# Patient Record
Sex: Male | Born: 1985 | Race: Black or African American | Hispanic: No | Marital: Single | Smoking: Current every day smoker
Health system: Southern US, Community
[De-identification: ages and names within clinical notes are randomized; demographics above are authoritative.]

## PROBLEM LIST (undated history)

## (undated) ENCOUNTER — Emergency Department (HOSPITAL_COMMUNITY): Discharge status: Absent without leave | Payer: Self-pay

## (undated) DIAGNOSIS — M1990 Unspecified osteoarthritis, unspecified site: Secondary | ICD-10-CM

## (undated) DIAGNOSIS — J45909 Unspecified asthma, uncomplicated: Secondary | ICD-10-CM

---
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
## 2007-01-31 ENCOUNTER — Emergency Department (HOSPITAL_COMMUNITY): Admission: EM | Admit: 2007-01-31 | Discharge: 2007-02-01 | Payer: Self-pay | Admitting: Emergency Medicine

## 2009-05-04 ENCOUNTER — Emergency Department (HOSPITAL_COMMUNITY): Admission: EM | Admit: 2009-05-04 | Discharge: 2009-05-04 | Payer: Self-pay | Admitting: Emergency Medicine

## 2009-11-23 ENCOUNTER — Emergency Department (HOSPITAL_COMMUNITY): Admission: EM | Admit: 2009-11-23 | Discharge: 2009-11-24 | Payer: Self-pay | Admitting: Emergency Medicine

## 2011-10-29 ENCOUNTER — Emergency Department (HOSPITAL_COMMUNITY)
Admission: EM | Admit: 2011-10-29 | Discharge: 2011-10-30 | Disposition: A | Discharge status: Home / Self Care | Payer: Self-pay | Attending: Emergency Medicine | Admitting: Emergency Medicine

## 2011-10-29 ENCOUNTER — Encounter (HOSPITAL_COMMUNITY): Payer: Self-pay | Admitting: Emergency Medicine

## 2011-10-29 DIAGNOSIS — F1010 Alcohol abuse, uncomplicated: Secondary | ICD-10-CM | POA: Insufficient documentation

## 2011-10-29 LAB — CBC
MCH: 31.5 pg (ref 26.0–34.0)
Platelets: 227 10*3/uL (ref 150–400)
RDW: 12.2 % (ref 11.5–15.5)

## 2011-10-29 LAB — RAPID URINE DRUG SCREEN, HOSP PERFORMED
Amphetamines: NOT DETECTED
Barbiturates: NOT DETECTED
Opiates: NOT DETECTED
Tetrahydrocannabinol: NOT DETECTED

## 2011-10-29 LAB — COMPREHENSIVE METABOLIC PANEL
AST: 38 U/L — ABNORMAL HIGH (ref 0–37)
Alkaline Phosphatase: 56 U/L (ref 39–117)
BUN: 7 mg/dL (ref 6–23)
Calcium: 8.7 mg/dL (ref 8.4–10.5)
GFR calc non Af Amer: >90 mL/min (ref >90)
Glucose, Bld: 95 mg/dL (ref 70–99)
Potassium: 3.6 mEq/L (ref 3.5–5.1)
Total Bilirubin: 0.3 mg/dL (ref 0.3–1.2)

## 2011-10-29 MED ORDER — THIAMINE HCL 100 MG/ML IJ SOLN
100.0000 mg | Freq: Every day | INTRAMUSCULAR | Status: DC
  Filled 2011-10-29: qty 2

## 2011-10-29 MED ORDER — SODIUM CHLORIDE 0.9 % IV BOLUS (SEPSIS)
1000.0000 mL | Freq: Once | INTRAVENOUS | Status: AC
  Administered 2011-10-29: 1000 mL via INTRAVENOUS

## 2011-10-29 MED ORDER — LORAZEPAM 2 MG/ML IJ SOLN: 1.0000 mg | Freq: Four times a day (QID) | INTRAMUSCULAR | Status: DC | PRN

## 2011-10-29 MED ORDER — ONDANSETRON HCL 8 MG PO TABS: 4.0000 mg | ORAL_TABLET | Freq: Three times a day (TID) | ORAL | Status: DC | PRN

## 2011-10-29 MED ORDER — ADULT MULTIVITAMIN W/MINERALS CH
1.0000 | ORAL_TABLET | Freq: Every day | ORAL | Status: DC
  Administered 2011-10-29: 1 via ORAL
  Filled 2011-10-29: qty 1

## 2011-10-29 MED ORDER — LORAZEPAM 1 MG PO TABS: 0.0000 mg | ORAL_TABLET | Freq: Two times a day (BID) | ORAL | Status: DC

## 2011-10-29 MED ORDER — VITAMIN B-1 100 MG PO TABS
100.0000 mg | ORAL_TABLET | Freq: Every day | ORAL | Status: DC
  Administered 2011-10-29: 100 mg via ORAL
  Filled 2011-10-29: qty 1

## 2011-10-29 MED ORDER — FOLIC ACID 1 MG PO TABS
1.0000 mg | ORAL_TABLET | Freq: Every day | ORAL | Status: DC
  Administered 2011-10-29: 1 mg via ORAL
  Filled 2011-10-29: qty 1

## 2011-10-29 MED ORDER — LORAZEPAM 1 MG PO TABS
0.0000 mg | ORAL_TABLET | Freq: Four times a day (QID) | ORAL | Status: DC
  Administered 2011-10-29: 1 mg via ORAL

## 2011-10-29 MED ORDER — IBUPROFEN 200 MG PO TABS: 600.0000 mg | ORAL_TABLET | Freq: Three times a day (TID) | ORAL | Status: DC | PRN

## 2011-10-29 MED ORDER — LORAZEPAM 1 MG PO TABS
1.0000 mg | ORAL_TABLET | Freq: Four times a day (QID) | ORAL | Status: DC | PRN
  Filled 2011-10-29: qty 1
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
## 2011-10-29 NOTE — ED Notes (Signed)
Pt here for detox from ETOH; pt sts drinks 3 40oz beers a day; pt sts last drink was today; pt tearful at present and denies SI/HI

## 2011-10-29 NOTE — ED Provider Notes (Signed)
History     CSN: [id number]  Arrival date & time 10/29/11  1457   First MD Initiated Contact with Patient 10/29/11 1657      Chief Complaint  Patient presents with  . Medical Clearance    (Consider location/radiation/quality/duration/timing/severity/associated sxs/prior treatment) HPI Comments: Patient presents with alcohol abuse and intoxication requesting detoxification. Is dropped off by his neighbor because he was "drunk". States he drinks beer every day. He does want to quit. His last drink was just before arrival. He denies illicit drug use. He denies any suicidal or homicidal ideation. Denies any trauma or pain complaints.  The history is provided by the patient.    History reviewed. No pertinent past medical history.  History reviewed. No pertinent past surgical history.  History reviewed. No pertinent family history.  History  Substance Use Topics  . Smoking status: Current Everyday Smoker  . Smokeless tobacco: Not on file  . Alcohol Use: Yes      Review of Systems  Constitutional: Negative for fever and fatigue.  HENT: Negative for congestion and rhinorrhea.   Respiratory: Negative for cough and shortness of breath.   Cardiovascular: Negative for chest pain.  Gastrointestinal: Negative for abdominal pain.  Genitourinary: Negative for dysuria.  Musculoskeletal: Negative for back pain.  Neurological: Negative for headaches.    Allergies  Review of patient's allergies indicates no known allergies.  Home Medications  No current outpatient prescriptions on file.  BP 131/86  Pulse 86  Temp(Src) 98 F (36.7 C) (Oral)  Resp 16  SpO2 99%  Physical Exam  Constitutional: He is oriented to person, place, and time. He appears well-developed and well-nourished. No distress.  HENT:  Head: Atraumatic.  Eyes: Right conjunctiva is injected. Left conjunctiva is injected.  Neck: Normal range of motion. Neck supple.  Cardiovascular: Normal rate, regular rhythm and  normal heart sounds.   Pulmonary/Chest: Effort normal and breath sounds normal. No respiratory distress.  Abdominal: Soft. There is no tenderness. There is no rebound and no guarding.  Musculoskeletal: Normal range of motion. He exhibits no edema and no tenderness.  Neurological: He is alert and oriented to person, place, and time. No cranial nerve deficit.  Skin: Skin is warm.    ED Course  Procedures (including critical care time)  Labs Reviewed  COMPREHENSIVE METABOLIC PANEL - Abnormal; Notable for the following:    Albumin 3.1 (*)    AST 38 (*)    All other components within normal limits  ETHANOL - Abnormal; Notable for the following:    Alcohol, Ethyl (B) 260 (*)    All other components within normal limits  CBC  URINE RAPID DRUG SCREEN (HOSP PERFORMED)   No results found.   1. Alcohol abuse       MDM  Alcohol abuse with intoxication. No evidence of trauma. No suicidal or homicidal thoughts.  Alcohol withdrawal protocol. Screening labs, discuss with act team  HR improved.  No signs of active withdrawal.  D/w Tammy Sours of ACT team.  Patient has resources through parole program and does not meet inpatient criteria.  NO SI or HI. HR 94 at discharge, no tremors.    Glynn Octave, MD 10/30/11 1315

## 2011-10-30 NOTE — ED Notes (Signed)
Pt states he arrived to facility via bus - pt states he is not able to obtain a ride home at this time - d/t pt's ETOH level being 260 @ 1600 pt to wait in facility until pt more appropriate for discharge. Cletis Athens, Consulting civil engineer made aware.

## 2011-10-30 NOTE — Discharge Instructions (Signed)
Alcohol Problems Followup with the alcohol abuse resources given. Return to the ED if you to their worsening symptoms Most adults who drink alcohol drink in moderation (not a lot) are at low risk for developing problems related to their drinking. However, all drinkers, including low-risk drinkers, should know about the health risks connected with drinking alcohol. RECOMMENDATIONS FOR LOW-RISK DRINKING  Drink in moderation. Moderate drinking is defined as follows:   Men - no more than 2 drinks per day.   Nonpregnant women - no more than 1 drink per day.   Over age 62 - no more than 1 drink per day.  A standard drink is 12 grams of pure alcohol, which is equal to a 12 ounce bottle of beer or wine cooler, a 5 ounce glass of wine, or 1.5 ounces of distilled spirits (such as whiskey, brandy, vodka, or rum).  ABSTAIN FROM (DO NOT DRINK) ALCOHOL:  When pregnant or considering pregnancy.   When taking a medication that interacts with alcohol.   If you are alcohol dependent.   A medical condition that prohibits drinking alcohol (such as ulcer, liver disease, or heart disease).  DISCUSS WITH YOUR CAREGIVER:  If you are at risk for coronary heart disease, discuss the potential benefits and risks of alcohol use: Light to moderate drinking is associated with lower rates of coronary heart disease in certain populations (for example, men over age 64 and postmenopausal women). Infrequent or nondrinkers are advised not to begin light to moderate drinking to reduce the risk of coronary heart disease so as to avoid creating an alcohol-related problem. Similar protective effects can likely be gained through proper diet and exercise.   Women and the elderly have smaller amounts of body water than men. As a result women and the elderly achieve a higher blood alcohol concentration after drinking the same amount of alcohol.   Exposing a fetus to alcohol can cause a broad range of birth defects referred to as  Fetal Alcohol Syndrome (FAS) or Alcohol-Related Birth Defects (ARBD). Although FAS/ARBD is connected with excessive alcohol consumption during pregnancy, studies also have reported neurobehavioral problems in infants born to mothers reporting drinking an average of 1 drink per day during pregnancy.   Heavier drinking (the consumption of more than 4 drinks per occasion by men and more than 3 drinks per occasion by women) impairs learning (cognitive) and psychomotor functions and increases the risk of alcohol-related problems, including accidents and injuries.  CAGE QUESTIONS:   Have you ever felt that you should Cut down on your drinking?   Have people Annoyed you by criticizing your drinking?   Have you ever felt bad or Guilty about your drinking?   Have you ever had a drink first thing in the morning to steady your nerves or get rid of a hangover (Eye opener)?  If you answered positively to any of these questions: You may be at risk for alcohol-related problems if alcohol consumption is:   Men: Greater than 14 drinks per week or more than 4 drinks per occasion.   Women: Greater than 7 drinks per week or more than 3 drinks per occasion.  Do you or your family have a medical history of alcohol-related problems, such as:  Blackouts.   Sexual dysfunction.   Depression.   Trauma.   Liver dysfunction.   Sleep disorders.   Hypertension.   Chronic abdominal pain.   Has your drinking ever caused you problems, such as problems with your family, problems with your work (  or school) performance, or accidents/injuries?   Do you have a compulsion to drink or a preoccupation with drinking?   Do you have poor control or are you unable to stop drinking once you have started?   Do you have to drink to avoid withdrawal symptoms?   Do you have problems with withdrawal such as tremors, nausea, sweats, or mood disturbances?   Does it take more alcohol than in the past to get you high?   Do  you feel a strong urge to drink?   Do you change your plans so that you can have a drink?   Do you ever drink in the morning to relieve the shakes or a hangover?  If you have answered a number of the previous questions positively, it may be time for you to talk to your caregivers, family, and friends and see if they think you have a problem. Alcoholism is a chemical dependency that keeps getting worse and will eventually destroy your health and relationships. Many alcoholics end up dead, impoverished, or in prison. This is often the end result of all chemical dependency.  Do not be discouraged if you are not ready to take action immediately.   Decisions to change behavior often involve up and down desires to change and feeling like you cannot decide.   Try to think more seriously about your drinking behavior.   Think of the reasons to quit.  WHERE TO GO FOR ADDITIONAL INFORMATION   The National Institute on Alcohol Abuse and Alcoholism (NIAAA)www.niaaa.nih.gov   [employer] on Alcoholism and Drug Dependence (NCADD)www.ncadd.org   American Society of Addiction Medicine (ASAM)www.https://anderson-johnson.com/  Document Released: 05/12/2005 Document Revised: 05/01/2011 Document Reviewed: 12/29/2007 [facility] Patient Information 2012 [place], [place].

## 2011-10-30 NOTE — ED Notes (Signed)
D/c instructions reviewed w/ pt - pt denies any further questions or concerns at present. Pt ambulating independently w/ steady gait on d/c in no acute distress, A&Ox4.  

## 2012-11-05 ENCOUNTER — Other Ambulatory Visit: Payer: Self-pay | Admitting: Occupational Medicine

## 2012-11-05 ENCOUNTER — Ambulatory Visit: Payer: Self-pay

## 2012-11-05 DIAGNOSIS — R52 Pain, unspecified: Secondary | ICD-10-CM

## 2013-11-24 ENCOUNTER — Encounter (HOSPITAL_COMMUNITY): Payer: Self-pay | Admitting: Emergency Medicine

## 2013-11-24 ENCOUNTER — Emergency Department (HOSPITAL_COMMUNITY)
Admission: EM | Admit: 2013-11-24 | Discharge: 2013-11-24 | Disposition: A | Discharge status: Home / Self Care | Payer: Self-pay | Attending: Emergency Medicine | Admitting: Emergency Medicine

## 2013-11-24 DIAGNOSIS — M129 Arthropathy, unspecified: Secondary | ICD-10-CM | POA: Insufficient documentation

## 2013-11-24 DIAGNOSIS — Z113 Encounter for screening for infections with a predominantly sexual mode of transmission: Secondary | ICD-10-CM | POA: Insufficient documentation

## 2013-11-24 DIAGNOSIS — Z202 Contact with and (suspected) exposure to infections with a predominantly sexual mode of transmission: Secondary | ICD-10-CM | POA: Insufficient documentation

## 2013-11-24 HISTORY — DX: Unspecified osteoarthritis, unspecified site: M19.90

## 2013-11-24 NOTE — Discharge Instructions (Signed)
Be sure to practice safe sex. Monitor symptom, if develop new lesions please follow-up with the health dept for re-check.

## 2013-11-24 NOTE — ED Provider Notes (Signed)
Medical screening examination/treatment/procedure(s) were performed by non-physician practitioner and as supervising physician I was immediately available for consultation/collaboration.   EKG Interpretation None        Jhoana Upham, MD 11/24/13 2319 

## 2013-11-24 NOTE — ED Notes (Signed)
Pt states- bumps on penis since March, denies itching pain or discharge. Wants to be checked for STDS. One of patients partners tested positive for herpes simplex 1.

## 2013-11-24 NOTE — ED Provider Notes (Signed)
CSN: [id number]     Arrival date & time 11/24/13  1724 History  This chart was scribed for non-physician practitioner, Garlon HatchetLisa M Tynslee Bowlds, PA-C, working with Rolan BuccoMelanie Belfi, MD, by Bronson CurbJacqueline Melvin, ED Scribe. This patient was seen in room TR07C/TR07C and the patient's care was started at 6:02 PM.    Chief Complaint  Patient presents with  . SEXUALLY TRANSMITTED DISEASE    The history is provided by the patient. No language interpreter was used.    HPI Comments: Howard Adams is a 28 y.o. male who presents to the Emergency Department complaining of bumps on penis since March. Patient reports he has been having unprotected sexual intercourse with multiple partners and one of his partners tested positive for Herpes Simplex 1 last week. States bumps are raised but non-painful. Patient is concerned of possible herpes and just wanted to get "bumps" checked. Pateint denies fever, itching, penile discharge, penile pain, dysuria, hematuria, abdominal pain, or testicular pain.  He has no prior hx of HSV1 or HSV2.  He has no current oral lesions.   Past Medical History  Diagnosis Date  . Arthritis    History reviewed. No pertinent past surgical history. History reviewed. No pertinent family history. History  Substance Use Topics  . Smoking status: Current Every Day Smoker  . Smokeless tobacco: Not on file  . Alcohol Use: Yes    Review of Systems  Genitourinary: Negative for dysuria, penile pain and testicular pain.       "bumps" on penis x4 months  All other systems reviewed and are negative.     Allergies  Review of patient's allergies indicates no known allergies.  Home Medications   Prior to Admission medications   Not on File   Triage Vitals: BP 119/74  Pulse 80  Temp(Src) 98.3 F (36.8 C) (Oral)  Resp 16  SpO2 97%  Physical Exam  Nursing note and vitals reviewed. Constitutional: He is oriented to person, place, and time. He appears well-developed and well-nourished.  HENT:   Head: Normocephalic and atraumatic.  Mouth/Throat: Oropharynx is clear and moist.  No oral lesions noted  Eyes: Conjunctivae and EOM are normal. Pupils are equal, round, and reactive to light.  Neck: Normal range of motion. Neck supple.  Cardiovascular: Normal rate, regular rhythm and normal heart sounds.   Pulmonary/Chest: Effort normal and breath sounds normal.  Genitourinary: Testes normal and penis normal. Circumcised. No penile erythema or penile tenderness. No discharge found.  Penis with small, wart like lesions present, mostly along left side of penis; lesions non-tender; no vesicles, ulcerations, or pustules present; no urethral discharge or erythema; testicles non-tender  Musculoskeletal: Normal range of motion.  Neurological: He is alert and oriented to person, place, and time.  Skin: Skin is warm and dry.  Psychiatric: He has a normal mood and affect.    ED Course  Procedures (including critical care time)  DIAGNOSTIC STUDIES: Oxygen Saturation is 97% on room air, adequate by my interpretation.    COORDINATION OF CARE: 6:14 PM- Pt advised of plan for treatment and pt agrees.   Labs Review Labs Reviewed - No data to display  Imaging Review No results found.   EKG Interpretation None      MDM   Final diagnoses:  Possible exposure to STD   On exam, pt has small wart-like lesions mostly concentrated along left side of his penis-- questionable HPV.  Lesions are non-tender, no vesicles, or ulcerations present.  Pt likely with genital warts, lesions are not  concerning for HSV.  He has no other associated sx or concern for other STD at this time.  I have discussed with him several times that just because he does not have a current herpetic outbreak, does not mean that it will not occur.  He was advised several times on safe sex practices and monitoring symptoms closely. He was encouraged to follow with the health department if problems occur.   Discussed plan with  patient, he/she acknowledged understanding and agreed with plan of care.  Return precautions given for new or worsening symptoms.  I personally performed the services described in this documentation, which was scribed in my presence. The recorded information has been reviewed and is accurate.  Garlon HatchetLisa M Earnie Rockhold, PA-C 11/24/13 1842

## 2015-07-28 ENCOUNTER — Emergency Department (HOSPITAL_COMMUNITY)
Admission: EM | Admit: 2015-07-28 | Discharge: 2015-07-28 | Disposition: A | Discharge status: Home / Self Care | Payer: Self-pay | Attending: Emergency Medicine | Admitting: Emergency Medicine

## 2015-07-28 ENCOUNTER — Emergency Department (HOSPITAL_COMMUNITY): Payer: Self-pay

## 2015-07-28 ENCOUNTER — Encounter (HOSPITAL_COMMUNITY): Payer: Self-pay | Admitting: Emergency Medicine

## 2015-07-28 DIAGNOSIS — Y9289 Other specified places as the place of occurrence of the external cause: Secondary | ICD-10-CM | POA: Insufficient documentation

## 2015-07-28 DIAGNOSIS — Y998 Other external cause status: Secondary | ICD-10-CM | POA: Insufficient documentation

## 2015-07-28 DIAGNOSIS — W500XXA Accidental hit or strike by another person, initial encounter: Secondary | ICD-10-CM | POA: Insufficient documentation

## 2015-07-28 DIAGNOSIS — F17200 Nicotine dependence, unspecified, uncomplicated: Secondary | ICD-10-CM | POA: Insufficient documentation

## 2015-07-28 DIAGNOSIS — Y9389 Activity, other specified: Secondary | ICD-10-CM | POA: Insufficient documentation

## 2015-07-28 DIAGNOSIS — S29001A Unspecified injury of muscle and tendon of front wall of thorax, initial encounter: Secondary | ICD-10-CM | POA: Insufficient documentation

## 2015-07-28 DIAGNOSIS — R0781 Pleurodynia: Secondary | ICD-10-CM

## 2015-07-28 MED ORDER — METHOCARBAMOL 500 MG PO TABS
1000.0000 mg | ORAL_TABLET | Freq: Once | ORAL | Status: AC
Start: 2015-07-28 — End: 2015-07-28
  Administered 2015-07-28: 1000 mg via ORAL
  Filled 2015-07-28: qty 2

## 2015-07-28 MED ORDER — IBUPROFEN 800 MG PO TABS
800.0000 mg | ORAL_TABLET | Freq: Once | ORAL | Status: AC
  Administered 2015-07-28: 800 mg via ORAL
  Filled 2015-07-28: qty 1

## 2015-07-28 MED ORDER — IBUPROFEN 800 MG PO TABS: 800.0000 mg | ORAL_TABLET | Freq: Three times a day (TID) | ORAL | Status: DC

## 2015-07-28 MED ORDER — METHOCARBAMOL 500 MG PO TABS: 1000.0000 mg | ORAL_TABLET | Freq: Two times a day (BID) | ORAL | Status: DC

## 2015-07-28 NOTE — Discharge Instructions (Signed)
Medications given today: Ibuprofen, Robaxin  Treatment: Take Ibuprofen and Robaxin (muscle relaxer) as prescribed. Alternate ice and heat, 20 minutes each. Avoid any heavy lifting. Attempt to quit smoking to accelerate healing and re-injury.  Follow-up: Make an appointment with your Primary Care Provider in 1 week for reevaluation. Return to the Emergency Department if you experience any sudden chest pain, shortness of breath, numbness, or any other concerning symptoms.

## 2015-07-28 NOTE — ED Notes (Signed)
ED PA at bedside
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
## 2015-07-28 NOTE — ED Notes (Addendum)
PT made aware he cannot drive or operate machinery for the rest of the day or while continuing to take Robaxin
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
## 2017-02-04 ENCOUNTER — Emergency Department (HOSPITAL_COMMUNITY): Payer: Self-pay

## 2017-02-04 ENCOUNTER — Encounter (HOSPITAL_COMMUNITY): Payer: Self-pay | Admitting: Emergency Medicine

## 2017-02-04 ENCOUNTER — Emergency Department (HOSPITAL_COMMUNITY)
Admission: EM | Admit: 2017-02-04 | Discharge: 2017-02-04 | Disposition: A | Discharge status: Home / Self Care | Payer: Self-pay | Attending: Emergency Medicine | Admitting: Emergency Medicine

## 2017-02-04 DIAGNOSIS — R0789 Other chest pain: Secondary | ICD-10-CM | POA: Insufficient documentation

## 2017-02-04 DIAGNOSIS — F17200 Nicotine dependence, unspecified, uncomplicated: Secondary | ICD-10-CM | POA: Insufficient documentation

## 2017-02-04 DIAGNOSIS — Z79899 Other long term (current) drug therapy: Secondary | ICD-10-CM | POA: Insufficient documentation

## 2017-02-04 DIAGNOSIS — R0781 Pleurodynia: Secondary | ICD-10-CM

## 2017-02-04 DIAGNOSIS — M79672 Pain in left foot: Secondary | ICD-10-CM | POA: Insufficient documentation

## 2017-02-04 MED ORDER — IBUPROFEN 600 MG PO TABS: 600.0000 mg | ORAL_TABLET | Freq: Four times a day (QID) | ORAL | 0 refills | Status: DC | PRN

## 2017-02-04 MED ORDER — ACETAMINOPHEN 325 MG PO TABS: 650.0000 mg | ORAL_TABLET | Freq: Four times a day (QID) | ORAL | 0 refills | Status: DC | PRN

## 2017-02-04 MED ORDER — IBUPROFEN 800 MG PO TABS
800.0000 mg | ORAL_TABLET | Freq: Once | ORAL | Status: AC
  Administered 2017-02-04: 800 mg via ORAL
  Filled 2017-02-04: qty 1

## 2017-02-04 NOTE — Discharge Instructions (Addendum)
Please see the information and instructions below regarding your visit.  Your diagnoses today include:  1. Foot pain, left   2. Rib pain    Your imaging is reassuring that there is no fracture in your further ankle. Additionally, there is no fracture in your ribs. You may have some contusions which are deep bruising and injury to the tissues but not the bone.  Tests performed today include: See side panel of your discharge paperwork for testing performed today. Vital signs are listed at the bottom of these instructions.   1. X-ray left foot 2. X-ray left ankle 3. X-ray ribs  Medications prescribed:    You are prescribed ibuprofen, a non-steroidal anti-inflammatory agent (NSAID) for pain. You may take 600mg  every 6 hours as needed for pain. If still requiring this medication around the clock for acute pain after 10 days, please see your primary healthcare provider.  You may combine this medication with Tylenol, 650 mg every 6 hours, so you are receiving something for pain every 3 hours.  This is not a long-term medication unless under the care and direction of your primary provider. Taking this medication long-term and not under the supervision of a healthcare provider could increase the risk of stomach ulcers, kidney problems, and cardiovascular problems such as high blood pressure.   Take any prescribed medications only as prescribed, and any over the counter medications only as directed on the packaging.  Home care instructions:   Please rest, ice, elevate, and compress the left foot with an Ace wrap. Do not leave ice on skin for more than 20 minutes.   Please follow any educational materials contained in this packet.   Follow-up instructions: Please follow up with Dr. Roda ShuttersXu with [facility] as soon as possible.  Return instructions:  Please return to the Emergency Department if you experience worsening symptoms.  Please return for any worsening pain in your left foot,  swelling, loss of sensation, discoloration were your foot loses color, coldness of your left foot. Please return if you have any other emergent concerns.  Additional Information: Please avoid using Molly as a method of pain control. This drug can affect the heart, and put you at risk in the future for heart problems. Please take the ibuprofen prescribed to today for pain control.  Your vital signs today were: BP (!) 139/106 (BP Location: Left Arm)    Pulse (!) 107    Temp 98.2 F (36.8 C) (Oral)    Resp 18    SpO2 98%  If your blood pressure (BP) was elevated on multiple readings during this visit above 130 for the top number or above 80 for the bottom number, please have this repeated by your primary care provider within one month. --------------  Thank you for allowing us to participate in your care today.

## 2017-02-04 NOTE — ED Triage Notes (Signed)
Pt reports left heel pain that began today after he landed wrong after doing a back flip. Pt reports he cannot put weight on his left heel.

## 2017-02-04 NOTE — ED Notes (Signed)
Pt in xray
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
## 2017-02-09 ENCOUNTER — Emergency Department (HOSPITAL_COMMUNITY): Admission: EM | Admit: 2017-02-09 | Discharge: 2017-02-09 | Discharge status: Absent without leave | Payer: Self-pay

## 2017-02-10 ENCOUNTER — Telehealth: Payer: Self-pay | Admitting: Emergency Medicine

## 2017-02-10 NOTE — Telephone Encounter (Signed)
Pt called stating his foot did no feel any better and that he will need an additional work note.  Advised him to follow up with PCP or specialist as discussed during D/C from ED visit.  Pt reported he called but was unable to get through to Dr. Warren Danes office.  Advised him he had the correct number and to continue to try.  No further CM needs noted at this time.

## 2017-02-11 ENCOUNTER — Emergency Department (HOSPITAL_COMMUNITY)
Admission: EM | Admit: 2017-02-11 | Discharge: 2017-02-11 | Disposition: A | Discharge status: Home / Self Care | Payer: Self-pay | Attending: Emergency Medicine | Admitting: Emergency Medicine

## 2017-02-11 ENCOUNTER — Encounter (HOSPITAL_COMMUNITY): Payer: Self-pay | Admitting: *Deleted

## 2017-02-11 DIAGNOSIS — F17200 Nicotine dependence, unspecified, uncomplicated: Secondary | ICD-10-CM | POA: Insufficient documentation

## 2017-02-11 DIAGNOSIS — Y9389 Activity, other specified: Secondary | ICD-10-CM | POA: Insufficient documentation

## 2017-02-11 DIAGNOSIS — S99822A Other specified injuries of left foot, initial encounter: Secondary | ICD-10-CM | POA: Insufficient documentation

## 2017-02-11 DIAGNOSIS — Y929 Unspecified place or not applicable: Secondary | ICD-10-CM | POA: Insufficient documentation

## 2017-02-11 DIAGNOSIS — X58XXXA Exposure to other specified factors, initial encounter: Secondary | ICD-10-CM | POA: Insufficient documentation

## 2017-02-11 DIAGNOSIS — Y999 Unspecified external cause status: Secondary | ICD-10-CM | POA: Insufficient documentation

## 2017-02-11 DIAGNOSIS — S99922A Unspecified injury of left foot, initial encounter: Secondary | ICD-10-CM

## 2017-02-11 NOTE — ED Provider Notes (Signed)
[facility] Provider Note   CSN: [id number] Arrival date & time: 02/11/17  0645     History   Chief Complaint Chief Complaint  Patient presents with  . Foot Pain    HPI Howard Adams is a 31 y.o. male.  HPI   Howard Adams returns to the ER for re-evaluation of left heel pain. He was doing a back flip and landed on his heel. He was seen on 9/12 and diagnosed with a contusion to his feel. He received crutches and a post-op boot. He comes back because the ortho office wants $100 upfront and he doesn't have that for follow-up. He works in [employer] and needs to be able to stand for 10 hours but his heel still is painful to put pressure on. Swelling is not worse, no redness, no numbness. No new areas of pain.   History reviewed. No pertinent past medical history.  There are no active problems to display for this patient.   History reviewed. No pertinent surgical history.     Home Medications    Prior to Admission medications   Medication Sig Start Date End Date Taking? Authorizing Provider  acetaminophen (TYLENOL) 325 MG tablet Take 2 tablets (650 mg total) by mouth every 6 (six) hours as needed. 02/04/17   Aviva Kluver B, PA-C  ibuprofen (ADVIL,MOTRIN) 600 MG tablet Take 1 tablet (600 mg total) by mouth every 6 (six) hours as needed. 02/04/17   Aviva Kluver B, PA-C  methocarbamol (ROBAXIN) 500 MG tablet Take 2 tablets (1,000 mg total) by mouth 2 (two) times daily. 07/28/15   Emi Holes, PA-C    Family History History reviewed. No pertinent family history.  Social History Social History  Substance Use Topics  . Smoking status: Current Every Day Smoker    Packs/day: 1.00    Years: 10.00  . Smokeless tobacco: Not on file  . Alcohol use Yes     Allergies   Patient has no known allergies.   Review of Systems Review of Systems The patient denies anorexia, fever, weight loss,, vision loss, decreased hearing, hoarseness, chest pain, syncope, dyspnea on  exertion, peripheral edema, balance deficits, hemoptysis, abdominal pain, melena, hematochezia, severe indigestion/heartburn, hematuria, incontinence, genital sores, muscle weakness, suspicious skin lesions, transient blindness,, depression, unusual weight change, abnormal bleeding, enlarged lymph nodes, angioedema, and breast masses.    Physical Exam Updated Vital Signs BP 130/87 (BP Location: Right Arm)   Pulse 92   Temp 97.9 F (36.6 C) (Oral)   Resp 18   SpO2 98%   Physical Exam  Constitutional: He appears well-developed and well-nourished. No distress.  HENT:  Head: Normocephalic and atraumatic.  Eyes: Pupils are equal, round, and reactive to light.  Neck: Normal range of motion. Neck supple.  Cardiovascular: Normal rate and regular rhythm.   Pulmonary/Chest: Effort normal.  Abdominal: Soft.  Musculoskeletal:       Left foot: There is tenderness, bony tenderness and swelling (mild). There is normal range of motion, normal capillary refill, no crepitus, no deformity and no laceration.       Feet:  Achilles grossly intact and non tender.  Neurological: He is alert.  Skin: Skin is warm and dry.  Nursing note and vitals reviewed.    ED Treatments / Results  Labs (all labs ordered are listed, but only abnormal results are displayed) Labs Reviewed - No data to display  EKG  EKG Interpretation None       Radiology No results found.  Procedures Procedures (  including critical care time)  Medications Ordered in ED Medications - No data to display   Initial Impression / Assessment and Plan / ED Course  I have reviewed the triage vital signs and the nursing notes.  Pertinent labs & imaging results that were available during my care of the patient were reviewed by me and considered in my medical decision making (see chart for details).     No infection is present or emergent s/sx.Marland Kitchen He is tender to his calcaneous. He was offered a repeat xray and declined. Will  try referring back to Ortho. Original referral was for [place] but today it is Dr. Greig Right office. Continue Ibuprofen and non weight bearing. Will give work note  Blood pressure 130/87, pulse 92, temperature 97.9 F (36.6 C), temperature source Oral, resp. rate 18, SpO2 98 %.  Howard Adams has been evaluated today in the emergency department. The appropriate screening and testing was been performed and I believe the patient to be medically stable for discharge.   Return signs and symptoms have been discussed with the patient and/or caregivers and they have voiced their understanding. The patient has agreed to follow-up with their primary care provider or the referred specialist.      Final Clinical Impressions(s) / ED Diagnoses   Final diagnoses:  Injury of heel, left, initial encounter    New Prescriptions New Prescriptions   No medications on file     Marlon Pel, PA-C 02/11/17 0949    Mancel Bale, MD 02/11/17 [phone]

## 2017-02-11 NOTE — ED Triage Notes (Signed)
Pt was here on 9/12 and was diagnosed with contusion to left foot and reports still having foot pain.

## 2017-03-16 ENCOUNTER — Emergency Department (HOSPITAL_COMMUNITY): Payer: Self-pay

## 2017-03-16 ENCOUNTER — Emergency Department (HOSPITAL_COMMUNITY)
Admission: EM | Admit: 2017-03-16 | Discharge: 2017-03-16 | Disposition: A | Discharge status: Home / Self Care | Payer: Self-pay | Attending: Emergency Medicine | Admitting: Emergency Medicine

## 2017-03-16 ENCOUNTER — Encounter (HOSPITAL_COMMUNITY): Payer: Self-pay

## 2017-03-16 DIAGNOSIS — B349 Viral infection, unspecified: Secondary | ICD-10-CM | POA: Insufficient documentation

## 2017-03-16 DIAGNOSIS — J069 Acute upper respiratory infection, unspecified: Secondary | ICD-10-CM | POA: Insufficient documentation

## 2017-03-16 DIAGNOSIS — B9789 Other viral agents as the cause of diseases classified elsewhere: Secondary | ICD-10-CM

## 2017-03-16 DIAGNOSIS — F17200 Nicotine dependence, unspecified, uncomplicated: Secondary | ICD-10-CM | POA: Insufficient documentation

## 2017-03-16 DIAGNOSIS — J4521 Mild intermittent asthma with (acute) exacerbation: Secondary | ICD-10-CM | POA: Insufficient documentation

## 2017-03-16 MED ORDER — ALBUTEROL SULFATE HFA 108 (90 BASE) MCG/ACT IN AERS: 1.0000 | INHALATION_SPRAY | Freq: Four times a day (QID) | RESPIRATORY_TRACT | 0 refills | Status: DC | PRN

## 2017-03-16 MED ORDER — IPRATROPIUM-ALBUTEROL 0.5-2.5 (3) MG/3ML IN SOLN
3.0000 mL | Freq: Once | RESPIRATORY_TRACT | Status: AC
  Administered 2017-03-16: 3 mL via RESPIRATORY_TRACT
  Filled 2017-03-16: qty 3

## 2017-03-16 MED ORDER — PREDNISONE 10 MG PO TABS: 40.0000 mg | ORAL_TABLET | Freq: Every day | ORAL | 0 refills | Status: AC

## 2017-03-16 NOTE — ED Notes (Signed)
Pt ambulated with O2 saturator monitoring. Pt at rest was 94% RA and with Ambulation decreased to 92-93 %, with increased ambulation pt O2 levels improved up  to 98%.

## 2017-03-16 NOTE — Discharge Instructions (Signed)
Your x-ray did not show pneumonia.   I suspect you initially had a viral upper respiratory infection that caused an asthma flare. Your cigarette use is also not helping. You should stop smoking  Take prednisone as prescribed.  Use albuterol inhaler every 6 hours.  Take a daily allergy medication (claritin, zyrtec, etc).  Take mucinex or any other expectorant during the day to help you clear the mucus in your airways.  At night time, you can instead take a cough suppressant to give your body a break from repeat coughing and so you can sleep.  You are at risk of developing pneumonia. Return to ED if you develop fevers, chills, chest pain, worsening cough, body aches or develop any other concerning symptoms.

## 2017-03-16 NOTE — ED Triage Notes (Signed)
patient c/o a productive cough with green sputum x 2 1/2 weeks. Patient reports SOB with exertion.

## 2017-03-16 NOTE — ED Provider Notes (Signed)
[facility]-EMERGENCY DEPT Provider Note   CSN: [id number] Arrival date & time: 03/16/17  1540     History   Chief Complaint Chief Complaint  Patient presents with  . Cough    HPI Howard Adams is a 31 y.o. male with history of childhood asthma and tobacco use presents to the ED for productive cough with green sputum 3 weeks. Reports feeling more winded with normal ambulation. Spouse has been recently treated for pneumonia. Child at home has a cough as well. He denies fevers, chills, chest pain, myalgias, bodyaches, sore throat, rhinorrhea. Has used his wife's home albuterol nebulizing machine with some improvement. He smokes about one half pack per day.  HPI  History reviewed. No pertinent past medical history.  There are no active problems to display for this patient.   History reviewed. No pertinent surgical history.     Home Medications    Prior to Admission medications   Medication Sig Start Date End Date Taking? Authorizing Provider  acetaminophen (TYLENOL) 325 MG tablet Take 2 tablets (650 mg total) by mouth every 6 (six) hours as needed. 02/04/17   Aviva KluverMurray, Alyssa B, PA-C  albuterol (PROVENTIL HFA;VENTOLIN HFA) 108 (90 Base) MCG/ACT inhaler Inhale 1-2 puffs into the lungs every 6 (six) hours as needed for wheezing or shortness of breath. 03/16/17   Liberty HandyGibbons, Ky Rumple J, PA-C  ibuprofen (ADVIL,MOTRIN) 600 MG tablet Take 1 tablet (600 mg total) by mouth every 6 (six) hours as needed. 02/04/17   Aviva KluverMurray, Alyssa B, PA-C  methocarbamol (ROBAXIN) 500 MG tablet Take 2 tablets (1,000 mg total) by mouth 2 (two) times daily. 07/28/15   Law, Waylan BogaAlexandra M, PA-C  predniSONE (DELTASONE) 10 MG tablet Take 4 tablets (40 mg total) by mouth daily. 03/16/17 03/21/17  Liberty HandyGibbons, Ashleynicole Mcclees J, PA-C    Family History History reviewed. No pertinent family history.  Social History Social History  Substance Use Topics  . Smoking status: Current Every Day Smoker    Packs/day:  1.00    Years: 10.00  . Smokeless tobacco: Never Used  . Alcohol use Yes     Allergies   Patient has no known allergies.   Review of Systems Review of Systems  Constitutional: Negative for chills and fever.  HENT: Positive for congestion. Negative for postnasal drip, rhinorrhea, sneezing and sore throat.   Respiratory: Positive for cough, chest tightness, shortness of breath and wheezing. Negative for choking.   Cardiovascular: Negative for chest pain.  Gastrointestinal: Negative for abdominal pain.  Musculoskeletal: Negative for back pain.     Physical Exam Updated Vital Signs BP 130/90 (BP Location: Left Arm)   Pulse 100   Temp 98.3 F (36.8 C) (Oral)   Resp 18   Ht 5' 6.5" (1.689 m)   Wt 67.1 kg (148 lb)   SpO2 97%   BMI 23.53 kg/m   Physical Exam  Constitutional: He is oriented to person, place, and time. He appears well-developed and well-nourished. No distress.  NAD.  HENT:  Head: Normocephalic and atraumatic.  Right Ear: External ear normal.  Left Ear: External ear normal.  Nose: Nose normal.  Eyes: Conjunctivae and EOM are normal. No scleral icterus.  Neck: Normal range of motion. Neck supple.  Cardiovascular: Normal rate, regular rhythm, normal heart sounds and intact distal pulses.   No murmur heard. Pulmonary/Chest: Effort normal. He has wheezes.  RR within normal limits. SpO2 within normal limits.  +Diffuse inspiratory and expiratory wheezing in all lung fields most significant at upper and  middle lobes bilaterally (posteriorly) No egophony.  Normal breathing effort. Patient speaking in full sentences. No pursed lip breathing. No cyanosis. Chest wall expansion symmetric.  No chest wall tenderness.  Musculoskeletal: Normal range of motion. He exhibits no deformity.  Neurological: He is alert and oriented to person, place, and time.  Skin: Skin is warm and dry. Capillary refill takes less than 2 seconds.  Psychiatric: He has a normal mood and affect.  His behavior is normal. Judgment and thought content normal.  Nursing note and vitals reviewed.    ED Treatments / Results  Labs (all labs ordered are listed, but only abnormal results are displayed) Labs Reviewed - No data to display  EKG  EKG Interpretation None       Radiology Dg Chest 2 View  Result Date: 03/16/2017 CLINICAL DATA:  Cough EXAM: CHEST  2 VIEW COMPARISON:  02/04/2017 FINDINGS: Mild peribronchial thickening. No focal consolidation or effusion. Normal heart size. No pneumothorax IMPRESSION: Mild peribronchial thickening could relate to a bronchitis. No focal pulmonary infiltrate is seen Electronically Signed   By: Jasmine Pang M.D.   On: 03/16/2017 17:34    Procedures Procedures (including critical care time)  Medications Ordered in ED Medications  ipratropium-albuterol (DUONEB) 0.5-2.5 (3) MG/3ML nebulizer solution 3 mL (3 mLs Nebulization Given 03/16/17 1657)     Initial Impression / Assessment and Plan / ED Course  I have reviewed the triage vital signs and the nursing notes.  Pertinent labs & imaging results that were available during my care of the patient were reviewed by me and considered in my medical decision making (see chart for details).    31 year old male with history of childhood asthma not currently treated and tobacco abuse presents for persistent productive cough for 3 weeks a/w chest tightness and shortness of breath on ambulation. No fevers, chills, chest pain, myalgias. States his symptoms feel similar to when he had asthma exacerbations as a kid. Smokes half a pack a day. Has been using his wife's albuterol nebulizing machine which has been helping. On exam, oxygen saturations are normal. He is in no respiratory distress. He has diffuse wheezing bilaterally. He ambulated and remained above 98%. X-ray today is negative for infiltrate. Will discharge with prednisone, albuterol inhaler for asthma exacerbation most likely from viral URI. His  wife recently treated for pneumonia, his child at home has a cough and runny nose. Discussed return precautions. Patient is agreeable with ED treatment and plan.  Final Clinical Impressions(s) / ED Diagnoses   Final diagnoses:  Viral URI with cough  Mild intermittent asthma with exacerbation    New Prescriptions New Prescriptions   ALBUTEROL (PROVENTIL HFA;VENTOLIN HFA) 108 (90 BASE) MCG/ACT INHALER    Inhale 1-2 puffs into the lungs every 6 (six) hours as needed for wheezing or shortness of breath.   PREDNISONE (DELTASONE) 10 MG TABLET    Take 4 tablets (40 mg total) by mouth daily.     Liberty Handy, PA-C 03/16/17 1803    Marily Memos, MD 03/16/17 [phone]

## 2018-02-06 ENCOUNTER — Encounter (HOSPITAL_COMMUNITY): Payer: Self-pay

## 2018-02-06 ENCOUNTER — Emergency Department (HOSPITAL_COMMUNITY): Payer: Self-pay

## 2018-02-06 ENCOUNTER — Emergency Department (HOSPITAL_COMMUNITY)
Admission: EM | Admit: 2018-02-06 | Discharge: 2018-02-06 | Disposition: A | Discharge status: Home / Self Care | Payer: Self-pay | Attending: Emergency Medicine | Admitting: Emergency Medicine

## 2018-02-06 DIAGNOSIS — Z23 Encounter for immunization: Secondary | ICD-10-CM | POA: Insufficient documentation

## 2018-02-06 DIAGNOSIS — F17200 Nicotine dependence, unspecified, uncomplicated: Secondary | ICD-10-CM | POA: Insufficient documentation

## 2018-02-06 DIAGNOSIS — Y939 Activity, unspecified: Secondary | ICD-10-CM | POA: Insufficient documentation

## 2018-02-06 DIAGNOSIS — S0990XA Unspecified injury of head, initial encounter: Secondary | ICD-10-CM | POA: Insufficient documentation

## 2018-02-06 DIAGNOSIS — Y998 Other external cause status: Secondary | ICD-10-CM | POA: Insufficient documentation

## 2018-02-06 DIAGNOSIS — Y929 Unspecified place or not applicable: Secondary | ICD-10-CM | POA: Insufficient documentation

## 2018-02-06 DIAGNOSIS — S01511A Laceration without foreign body of lip, initial encounter: Secondary | ICD-10-CM | POA: Insufficient documentation

## 2018-02-06 MED ORDER — ACETAMINOPHEN 500 MG PO TABS
1000.0000 mg | ORAL_TABLET | Freq: Once | ORAL | Status: AC
  Administered 2018-02-06: 1000 mg via ORAL
  Filled 2018-02-06: qty 2

## 2018-02-06 MED ORDER — TETANUS-DIPHTH-ACELL PERTUSSIS 5-2.5-18.5 LF-MCG/0.5 IM SUSP
0.5000 mL | Freq: Once | INTRAMUSCULAR | Status: AC
  Administered 2018-02-06: 0.5 mL via INTRAMUSCULAR
  Filled 2018-02-06: qty 0.5

## 2018-02-06 MED ORDER — OXYCODONE HCL 5 MG PO TABS
5.0000 mg | ORAL_TABLET | Freq: Once | ORAL | Status: AC
  Administered 2018-02-06: 5 mg via ORAL
  Filled 2018-02-06: qty 1

## 2018-02-06 MED ORDER — LIDOCAINE-EPINEPHRINE (PF) 2 %-1:200000 IJ SOLN
20.0000 mL | Freq: Once | INTRAMUSCULAR | Status: DC
  Filled 2018-02-06: qty 20

## 2018-02-06 NOTE — ED Notes (Signed)
Patient transported to X-ray 

## 2018-02-06 NOTE — ED Notes (Signed)
Pt alert and oriented in NAD. Pt verbalized understanding of discharge instructions. 
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
## 2018-02-06 NOTE — Discharge Instructions (Signed)
Take 4 over the counter ibuprofen tablets 3 times a day or 2 over-the-counter naproxen tablets twice a day for pain. Also take tylenol 1000mg(2 extra strength) four times a day.    

## 2018-04-25 ENCOUNTER — Emergency Department (HOSPITAL_COMMUNITY)
Admission: EM | Admit: 2018-04-25 | Discharge: 2018-04-25 | Disposition: A | Discharge status: Other Acute Inpatient Hospital | Payer: Self-pay | Attending: Emergency Medicine | Admitting: Emergency Medicine

## 2018-04-25 ENCOUNTER — Other Ambulatory Visit: Payer: Self-pay

## 2018-04-25 ENCOUNTER — Inpatient Hospital Stay (HOSPITAL_COMMUNITY)
Admission: AD | Admit: 2018-04-25 | Discharge: 2018-04-26 | DRG: 885 | Disposition: A | Discharge status: Home / Self Care | Payer: Federal, State, Local not specified - Other | Source: Intra-hospital | Attending: Psychiatry | Admitting: Psychiatry

## 2018-04-25 ENCOUNTER — Encounter (HOSPITAL_COMMUNITY): Payer: Self-pay

## 2018-04-25 DIAGNOSIS — F10230 Alcohol dependence with withdrawal, uncomplicated: Secondary | ICD-10-CM | POA: Insufficient documentation

## 2018-04-25 DIAGNOSIS — F17210 Nicotine dependence, cigarettes, uncomplicated: Secondary | ICD-10-CM | POA: Diagnosis present

## 2018-04-25 DIAGNOSIS — R45851 Suicidal ideations: Secondary | ICD-10-CM | POA: Diagnosis present

## 2018-04-25 DIAGNOSIS — Z598 Other problems related to housing and economic circumstances: Secondary | ICD-10-CM | POA: Diagnosis not present

## 2018-04-25 DIAGNOSIS — F332 Major depressive disorder, recurrent severe without psychotic features: Principal | ICD-10-CM | POA: Diagnosis present

## 2018-04-25 DIAGNOSIS — Y908 Blood alcohol level of 240 mg/100 ml or more: Secondary | ICD-10-CM | POA: Diagnosis present

## 2018-04-25 DIAGNOSIS — Z79899 Other long term (current) drug therapy: Secondary | ICD-10-CM | POA: Insufficient documentation

## 2018-04-25 DIAGNOSIS — F419 Anxiety disorder, unspecified: Secondary | ICD-10-CM | POA: Diagnosis present

## 2018-04-25 DIAGNOSIS — F10920 Alcohol use, unspecified with intoxication, uncomplicated: Secondary | ICD-10-CM | POA: Insufficient documentation

## 2018-04-25 DIAGNOSIS — F17200 Nicotine dependence, unspecified, uncomplicated: Secondary | ICD-10-CM | POA: Insufficient documentation

## 2018-04-25 DIAGNOSIS — F1020 Alcohol dependence, uncomplicated: Secondary | ICD-10-CM | POA: Diagnosis present

## 2018-04-25 LAB — COMPREHENSIVE METABOLIC PANEL
ALBUMIN: 3.9 g/dL (ref 3.5–5.0)
ALT: 32 U/L (ref 0–44)
AST: 28 U/L (ref 15–41)
Alkaline Phosphatase: 63 U/L (ref 38–126)
Anion gap: 11 (ref 5–15)
BILIRUBIN TOTAL: 0.2 mg/dL — AB (ref 0.3–1.2)
BUN: 15 mg/dL (ref 6–20)
CHLORIDE: 112 mmol/L — AB (ref 98–111)
CO2: 20 mmol/L — ABNORMAL LOW (ref 22–32)
Calcium: 8.6 mg/dL — ABNORMAL LOW (ref 8.9–10.3)
Creatinine, Ser: 1.11 mg/dL (ref 0.61–1.24)
GFR calc Af Amer: >60 mL/min (ref >60)
GLUCOSE: 96 mg/dL (ref 70–99)
POTASSIUM: 4 mmol/L (ref 3.5–5.1)
Sodium: 143 mmol/L (ref 135–145)
Total Protein: 7.4 g/dL (ref 6.5–8.1)

## 2018-04-25 LAB — CBC
HEMATOCRIT: 42.1 % (ref 39.0–52.0)
HEMOGLOBIN: 13.9 g/dL (ref 13.0–17.0)
MCH: 31.4 pg (ref 26.0–34.0)
MCHC: 33 g/dL (ref 30.0–36.0)
MCV: 95 fL (ref 80.0–100.0)
Platelets: 179 10*3/uL (ref 150–400)
RBC: 4.43 MIL/uL (ref 4.22–5.81)
RDW: 12.3 % (ref 11.5–15.5)
WBC: 5.4 10*3/uL (ref 4.0–10.5)
nRBC: 0 % (ref 0.0–0.2)

## 2018-04-25 LAB — RAPID URINE DRUG SCREEN, HOSP PERFORMED
Amphetamines: NOT DETECTED
BARBITURATES: NOT DETECTED
Benzodiazepines: NOT DETECTED
Cocaine: NOT DETECTED
Opiates: NOT DETECTED
Tetrahydrocannabinol: NOT DETECTED

## 2018-04-25 LAB — CBG MONITORING, ED: GLUCOSE-CAPILLARY: 94 mg/dL (ref 70–99)

## 2018-04-25 LAB — SALICYLATE LEVEL: Salicylate Lvl: 12.2 mg/dL (ref 2.8–30.0)

## 2018-04-25 LAB — ETHANOL: Alcohol, Ethyl (B): 350 mg/dL (ref <10)

## 2018-04-25 LAB — ACETAMINOPHEN LEVEL: Acetaminophen (Tylenol), Serum: <10 ug/mL — ABNORMAL LOW (ref 10–30)

## 2018-04-25 MED ORDER — VITAMIN B-1 100 MG PO TABS: 100.0000 mg | ORAL_TABLET | Freq: Every day | ORAL | Status: DC

## 2018-04-25 MED ORDER — ONDANSETRON 4 MG PO TBDP: 4.0000 mg | ORAL_TABLET | Freq: Four times a day (QID) | ORAL | Status: DC | PRN

## 2018-04-25 MED ORDER — CHLORDIAZEPOXIDE HCL 25 MG PO CAPS: 25.0000 mg | ORAL_CAPSULE | Freq: Every day | ORAL | Status: DC

## 2018-04-25 MED ORDER — ADULT MULTIVITAMIN W/MINERALS CH
1.0000 | ORAL_TABLET | Freq: Every day | ORAL | Status: DC
  Administered 2018-04-25: 1 via ORAL
  Filled 2018-04-25: qty 1

## 2018-04-25 MED ORDER — ADULT MULTIVITAMIN W/MINERALS CH
1.0000 | ORAL_TABLET | Freq: Every day | ORAL | Status: DC
  Administered 2018-04-26: 1 via ORAL
  Filled 2018-04-25 (×3): qty 1

## 2018-04-25 MED ORDER — THIAMINE HCL 100 MG/ML IJ SOLN: 100.0000 mg | Freq: Once | INTRAMUSCULAR | Status: DC

## 2018-04-25 MED ORDER — ACETAMINOPHEN 325 MG PO TABS: 650.0000 mg | ORAL_TABLET | Freq: Four times a day (QID) | ORAL | Status: DC | PRN

## 2018-04-25 MED ORDER — CHLORDIAZEPOXIDE HCL 25 MG PO CAPS: 25.0000 mg | ORAL_CAPSULE | Freq: Three times a day (TID) | ORAL | Status: DC

## 2018-04-25 MED ORDER — CHLORDIAZEPOXIDE HCL 25 MG PO CAPS
25.0000 mg | ORAL_CAPSULE | Freq: Four times a day (QID) | ORAL | Status: AC
  Administered 2018-04-25 (×2): 25 mg via ORAL
  Filled 2018-04-25 (×2): qty 1

## 2018-04-25 MED ORDER — MAGNESIUM HYDROXIDE 400 MG/5ML PO SUSP: 30.0000 mL | Freq: Every day | ORAL | Status: DC | PRN

## 2018-04-25 MED ORDER — CHLORDIAZEPOXIDE HCL 25 MG PO CAPS
25.0000 mg | ORAL_CAPSULE | Freq: Four times a day (QID) | ORAL | Status: DC
  Administered 2018-04-25 (×2): 25 mg via ORAL
  Filled 2018-04-25 (×2): qty 1

## 2018-04-25 MED ORDER — LOPERAMIDE HCL 2 MG PO CAPS: 2.0000 mg | ORAL_CAPSULE | ORAL | Status: DC | PRN

## 2018-04-25 MED ORDER — CHLORDIAZEPOXIDE HCL 25 MG PO CAPS: 25.0000 mg | ORAL_CAPSULE | Freq: Four times a day (QID) | ORAL | Status: DC | PRN

## 2018-04-25 MED ORDER — HYDROXYZINE HCL 25 MG PO TABS
25.0000 mg | ORAL_TABLET | Freq: Four times a day (QID) | ORAL | Status: DC | PRN
  Administered 2018-04-25: 25 mg via ORAL
  Filled 2018-04-25: qty 1

## 2018-04-25 MED ORDER — CHLORDIAZEPOXIDE HCL 25 MG PO CAPS: 25.0000 mg | ORAL_CAPSULE | ORAL | Status: DC

## 2018-04-25 MED ORDER — VITAMIN B-1 100 MG PO TABS
100.0000 mg | ORAL_TABLET | Freq: Every day | ORAL | Status: DC
  Administered 2018-04-26: 100 mg via ORAL
  Filled 2018-04-25 (×3): qty 1

## 2018-04-25 MED ORDER — CHLORDIAZEPOXIDE HCL 25 MG PO CAPS
25.0000 mg | ORAL_CAPSULE | Freq: Three times a day (TID) | ORAL | Status: DC
  Administered 2018-04-26: 25 mg via ORAL
  Filled 2018-04-25: qty 1

## 2018-04-25 MED ORDER — HYDROXYZINE HCL 25 MG PO TABS: 25.0000 mg | ORAL_TABLET | Freq: Four times a day (QID) | ORAL | Status: DC | PRN

## 2018-04-25 MED ORDER — ALUM & MAG HYDROXIDE-SIMETH 200-200-20 MG/5ML PO SUSP: 30.0000 mL | ORAL | Status: DC | PRN

## 2018-04-25 NOTE — ED Notes (Signed)
Lab called with a critical etoh of 350. Pt is ambulatory without alteration in gait. Provider and charge nurse notified
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
## 2018-04-25 NOTE — BH Assessment (Addendum)
Tele Assessment Note   Patient Name: Howard Adams MRN: [id number] Referring Physician: Ward, Layla Maw, DO Location of Patient: [facility] Location of Provider: Behavioral Health TTS Department  Howard Adams is an 32 y.o. male who presents to the ED voluntarily. Pt reports worsening SI with a plan to jump off of a bridge. Pt states he has been feeling suicidal for the past several weeks. Pt identifies his stressors as feeling inadequate, not being able to provide for his significant other, unstable employment, and increased alcohol consumption. 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 Pt states his current significant other has dated men in the past that were actually able to provide for her and he feels that he is causing her pain because he cannot provide for her the same way in which they did. Pt states he feels like he is living in misery and he would rather die than continue to suffer in this way. Pt reports a fluctuating appetite whenever he is drinking he does not eat and when he is not drinking he "eats everything in sight." Pt states he wants to jump off of a bridge or step in front of a car. Pt continues to endorse SI during the assessment and cannot contract for safety at this time.   Per Nira Conn, NP pt meets criteria for inpt treatment. [facility] to review for possible admission. Current BAL 350.  Diagnosis: MDD, single episode, w/o psychosis; Alcohol use disorder, severe  Past Medical History: No past medical history on file.  No past surgical history on file.  Family History: No family history on file.  Social History:  reports that he has been smoking. He has a 10.00 pack-year smoking history. He has never used smokeless tobacco. He reports that he drinks alcohol. He reports that he does not use drugs.  Additional Social History:  Alcohol / Drug Use Pain Medications: See MAR Prescriptions: See MAR Over the Counter: See MAR History of  alcohol / drug use?: Yes Negative Consequences of Use: Personal relationships Withdrawal Symptoms: Patient aware of relationship between substance abuse and physical/medical complications Substance #1 Name of Substance 1: Alcohol 1 - Age of First Use: teens 1 - Amount (size/oz): excessive 1 - Frequency: daily 1 - Duration: ongoing 1 - Last Use / Amount: 04/24/18  CIWA: CIWA-Ar BP: (!) 137/93 Pulse Rate: (!) 108 COWS:    Allergies: No Known Allergies  Home Medications:  (Not in a hospital admission)  OB/GYN Status:  No LMP for male patient.  General Assessment Data Location of Assessment: [facility] TTS Assessment: In system Is this a Tele or Face-to-Face Assessment?: Tele Assessment Is this an Initial Assessment or a Re-assessment for this encounter?: Initial Assessment Patient Accompanied by:: (alone) Language Other than English: No What gender do you identify as?: Male Marital status: Long term relationship Pregnancy Status: No Living Arrangements: Spouse/significant other Can pt return to current living arrangement?: Yes Admission Status: Voluntary Is patient capable of signing voluntary admission?: Yes Referral Source: Self/Family/Friend Insurance type: none     Crisis Care Plan Living Arrangements: Spouse/significant other Name of Psychiatrist: none Name of Therapist: none  Education Status Is patient currently in school?: No Is the patient employed, unemployed or receiving disability?: Employed  Risk to self with the past 6 months Suicidal Ideation: Yes-Currently Present Has patient been a risk to self within the past 6 months prior to admission? : No Suicidal Intent: No Has patient had  any suicidal intent within the past 6 months prior to admission? : No Is patient at risk for suicide?: Yes Suicidal Plan?: Yes-Currently Present Has patient had any suicidal plan within the past 6 months prior to admission? : Yes Specify Current Suicidal Plan: pt has  thoughts of walking into traffic or jumping off of a bridge  Access to Means: Yes Specify Access to Suicidal Means: pt has access to traffic and a bridge  What has been your use of drugs/alcohol within the last 12 months?: excess alcohol use, current BAL 350 on arrival to ED Previous Attempts/Gestures: No Triggers for Past Attempts: None known Intentional Self Injurious Behavior: None Family Suicide History: No Recent stressful life event(s): Job Loss, Financial Problems Persecutory voices/beliefs?: No Depression: Yes Depression Symptoms: Despondent, Isolating, Guilt, Loss of interest in usual pleasures, Feeling worthless/self pity Substance abuse history and/or treatment for substance abuse?: Yes Suicide prevention information given to non-admitted patients: Not applicable  Risk to Others within the past 6 months Homicidal Ideation: No Does patient have any lifetime risk of violence toward others beyond the six months prior to admission? : No Thoughts of Harm to Others: No Current Homicidal Intent: No Current Homicidal Plan: No Access to Homicidal Means: No History of harm to others?: No Assessment of Violence: None Noted Does patient have access to weapons?: No Criminal Charges Pending?: No Does patient have a court date: No Is patient on probation?: Yes  Psychosis Hallucinations: None noted Delusions: None noted  Mental Status Report Appearance/Hygiene: In scrubs, Unremarkable Eye Contact: Good Motor Activity: Freedom of movement Speech: Logical/coherent Level of Consciousness: Alert Mood: Depressed, Guilty, Despair, Worthless, low self-esteem Affect: Appropriate to circumstance Anxiety Level: None Thought Processes: Relevant, Coherent Judgement: Impaired Orientation: Person, Place, Situation, Appropriate for developmental age, Time Obsessive Compulsive Thoughts/Behaviors: None  Cognitive Functioning Concentration: Normal Memory: Remote Intact, Recent Intact Is  patient IDD: No Insight: Poor Impulse Control: Poor Appetite: Fair Have you had any weight changes? : No Change Sleep: No Change Total Hours of Sleep: 8 Vegetative Symptoms: None  ADLScreening [facility] Assessment Services) Patient's cognitive ability adequate to safely complete daily activities?: Yes Patient able to express need for assistance with ADLs?: Yes Independently performs ADLs?: Yes (appropriate for developmental age)  Prior Inpatient Therapy Prior Inpatient Therapy: No  Prior Outpatient Therapy Prior Outpatient Therapy: No Does patient have an ACCT team?: No Does patient have Intensive In-House Services?  : No Does patient have [facility] services? : No Does patient have P4CC services?: No  ADL Screening (condition at time of admission) Patient's cognitive ability adequate to safely complete daily activities?: Yes Is the patient deaf or have difficulty hearing?: No Does the patient have difficulty seeing, even when wearing glasses/contacts?: No Does the patient have difficulty concentrating, remembering, or making decisions?: No Patient able to express need for assistance with ADLs?: Yes Does the patient have difficulty dressing or bathing?: No Independently performs ADLs?: Yes (appropriate for developmental age) Does the patient have difficulty walking or climbing stairs?: No Weakness of Legs: None Weakness of Arms/Hands: None  Home Assistive Devices/Equipment Home Assistive Devices/Equipment: None    Abuse/Neglect Assessment (Assessment to be complete while patient is alone) Abuse/Neglect Assessment Can Be Completed: Yes Physical Abuse: Denies Verbal Abuse: Denies Sexual Abuse: Denies Exploitation of patient/patient's resources: Denies Self-Neglect: Denies     Merchant navy officer (For Healthcare) Does Patient Have a Medical Advance Directive?: No Would patient like information on creating a medical advance directive?: No - Patient declined  Disposition: Per Nira ConnJason Berry, NP pt meets criteria for inpt treatment. [facility] to review for possible admission. Current BAL 350. Disposition Initial Assessment Completed for this Encounter: Yes Disposition of Patient: Admit Type of inpatient treatment program: Adult(per Nira ConnJason Berry, NP) Patient refused recommended treatment: No  This service was provided via telemedicine using a 2-way, interactive audio and video technology.  Names of all persons participating in this telemedicine service and their role in this encounter. Name: Howard Adams Role: Patient  Name: Princess Bruinsquicha Jozy Adams Role: TTS          Karolee Ohsquicha R Vallie Fayette 04/25/2018 2:14 AM

## 2018-04-25 NOTE — ED Notes (Signed)
[facility]patient has 2 bags of belongings-patient wanded prior to transport to secure area

## 2018-04-25 NOTE — Progress Notes (Signed)
Patient is a 32 year old male who [facility] with SI, with a plan to jump off a bridge. Pt presents with a sad affect and depressed mood- was calm and cooperative and answered questions appropriately throughout the admission interview.  Pt reports having feelings of inadequac,hopelessness, worrying, depression- with stressors being his work, not being able to see his child- reports that he lives with his gf who has 4 kids under the age of 13 yr  Pt reports drinking 4-5 16 oz beers a day on average. Pt currently denies pain, SI/HI and A/V hallucinations and reports wanting to get help. Admission paperwork completed and signed. Verbal understanding expressed. Belongings searched and secured in locker. Skin assessment completed with no abnormalities found.  VS obtained. Pt oriented to unit. Q 15 min checks and fall risk precautions initiated for safety

## 2018-04-25 NOTE — ED Notes (Signed)
Bed: RESB Expected date:  Expected time:  Means of arrival:  Comments: EMS male [age]'s overdose BC powders/suicide attempt

## 2018-04-25 NOTE — ED Triage Notes (Signed)
"  A lot of shit is not going right in my life". Patient states to this writer "I just want to jump off a bridge".

## 2018-04-25 NOTE — Progress Notes (Signed)
TTS spoke with pt's nurse Roseanna RainbowIlene, RN to set up telepsych cart.

## 2018-04-25 NOTE — Progress Notes (Signed)
[facility].  Service of Dr. Jama Flavorsobos, MD. Please call report to [phone].  Will call when bed is available. Rosey BathKelly Kagan Mutchler, RN

## 2018-04-25 NOTE — ED Triage Notes (Signed)
[facility] with complaint od Suicide Attempt by taking 4 BC powders prior to EMS arrival-patient also has been drinking alcohol. Patient told EMS he is Suicidal.

## 2018-04-25 NOTE — ED Provider Notes (Signed)
TIME SEEN: 1:02 AM  CHIEF COMPLAINT: Suicidal thoughts, alcohol intoxication  HPI: Patient is a 32 year old male with no significant past medical history who presents to the emergency department with 2 weeks of suicidal thoughts.  States he plans to jump off of a bridge.  States his girlfriend called police tonight who brought him to the emergency department.  He reports that he took 4 Goody powders today to help with back pain.  This was not a suicide attempt.  States took 2 of them at 5 PM and then 2 more just prior to arrival.  Reports drinking a 12 pack of beer today.  Denies drug use.  Denies history of psychiatric illness, psychiatric hospitalization or being on psychiatric medications.  ROS: See HPI Constitutional: no fever  Eyes: no drainage  ENT: no runny nose   Cardiovascular:  no chest pain  Resp: no SOB  GI: no vomiting GU: no dysuria Integumentary: no rash  Allergy: no hives  Musculoskeletal: no leg swelling  Neurological: no slurred speech ROS otherwise negative  PAST MEDICAL HISTORY/PAST SURGICAL HISTORY:  No past medical history on file.  MEDICATIONS:  Prior to Admission medications   Medication Sig Start Date End Date Taking? Authorizing Provider  acetaminophen (TYLENOL) 325 MG tablet Take 2 tablets (650 mg total) by mouth every 6 (six) hours as needed. 02/04/17   Aviva KluverMurray, Alyssa B, PA-C  albuterol (PROVENTIL HFA;VENTOLIN HFA) 108 (90 Base) MCG/ACT inhaler Inhale 1-2 puffs into the lungs every 6 (six) hours as needed for wheezing or shortness of breath. 03/16/17   Liberty HandyGibbons, Claudia J, PA-C  ibuprofen (ADVIL,MOTRIN) 600 MG tablet Take 1 tablet (600 mg total) by mouth every 6 (six) hours as needed. 02/04/17   Aviva KluverMurray, Alyssa B, PA-C  methocarbamol (ROBAXIN) 500 MG tablet Take 2 tablets (1,000 mg total) by mouth 2 (two) times daily. 07/28/15   Emi HolesLaw, Alexandra M, PA-C    ALLERGIES:  No Known Allergies  SOCIAL HISTORY:  Social History   Tobacco Use  . Smoking status:  Current Every Day Smoker    Packs/day: 1.00    Years: 10.00    Pack years: 10.00  . Smokeless tobacco: Never Used  Substance Use Topics  . Alcohol use: Yes    FAMILY HISTORY: No family history on file.  EXAM: BP (!) 137/101 (BP Location: Right Arm)   Pulse (!) 102   Resp 19   Ht 5' 6.5" (1.689 m)   Wt 80.7 kg   SpO2 97%   BMI 28.30 kg/m  CONSTITUTIONAL: Alert and oriented and responds appropriately to questions.  Patient is intoxicated. HEAD: Normocephalic EYES: Conjunctivae clear, pupils appear equal, EOMI ENT: normal nose; moist mucous membranes NECK: Supple, no meningismus, no nuchal rigidity, no LAD  CARD: RRR; S1 and S2 appreciated; no murmurs, no clicks, no rubs, no gallops RESP: Normal chest excursion without splinting or tachypnea; breath sounds clear and equal bilaterally; no wheezes, no rhonchi, no rales, no hypoxia or respiratory distress, speaking full sentences ABD/GI: Normal bowel sounds; non-distended; soft, non-tender, no rebound, no guarding, no peritoneal signs, no hepatosplenomegaly BACK:  The back appears normal and is non-tender to palpation, there is no CVA tenderness EXT: Normal ROM in all joints; non-tender to palpation; no edema; normal capillary refill; no cyanosis, no calf tenderness or swelling    SKIN: Normal color for age and race; warm; no rash NEURO: Moves all extremities equally PSYCH: Doris is SI with plan to jump off a bridge.  No HI or hallucinations.  MEDICAL DECISION MAKING: Patient here with suicidal thoughts.  He is intoxicated currently.  Will obtain screening labs, urine, EKG.  Will consult TTS.  ED PROGRESS: 2:15 AM  TTS recommends inpatient treatment and I agree.  He agrees to stay voluntarily at this time.  Alcohol level 350.  Salicylate level mildly elevated from recent [place] powder use.  Otherwise labs, urine unremarkable.   I reviewed all nursing notes, vitals, pertinent previous records, EKGs, lab and urine results, imaging  (as available).      EKG Interpretation  Date/Time:  Sunday April 25 2018 01:07:07 EST Ventricular Rate:  99 PR Interval:    QRS Duration: 76 QT Interval:  317 QTC Calculation: 407 R Axis:   97 Text Interpretation:  Sinus rhythm Borderline right axis deviation No significant change since last tracing Confirmed by Ward, Baxter Hire [id number]) on 04/25/2018 1:24:14 AM         Ward, Layla Maw, DO 04/25/18 0216

## 2018-04-25 NOTE — ED Notes (Signed)
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 Patient states he has a 389 month old with another male and she will not let him see the baby because she does not like the girl he is with now.

## 2018-04-25 NOTE — [facility] Counselor (Signed)
[facility]

## 2018-04-25 NOTE — Progress Notes (Signed)
Patient did attend the evening speaker AA meeting.  

## 2018-04-25 NOTE — ED Notes (Signed)
[facility] here to transport 

## 2018-04-25 NOTE — ED Notes (Signed)
Pt resting in bed with eyes closed and even, unlabored respirations.. No distress noted.  Will continue to monitor.  

## 2018-04-25 NOTE — ED Notes (Signed)
Pt oriented to unit. Pt denies SI, HI, or AVH at current time. Pt had to be redirected immediately after entering unit for yelling down hallway. Pt was given an opportunity to call his family to let them know he was here and he is safe. Pt contracts for safety on unit and appears to be in no acute distress. Will continue to monitor.

## 2018-04-25 NOTE — Progress Notes (Signed)
Pt being moved to [facility]. TTS to perform assessment once the pt has been moved. Marcelino DusterMichelle advised and requested 10 addl minutes to set up telepsych cart.

## 2018-04-25 NOTE — BH Assessment (Signed)
[facility] Assessment Progress Note  Pt is recommended for inpt treatment per Nira ConnJason Berry, NP. EDP Ward, Layla MawKristen N, DO and ED nurse have been advised. [facility] to review for possible admission.

## 2018-04-25 NOTE — ED Notes (Addendum)
Pt ambulatory w/o difficulty to [facility] with [facility], belongings given to driver. 

## 2018-04-25 NOTE — Progress Notes (Signed)
D:  Howard Adams was up and visible on the unit.  He attended evening AA group.  He denied SI/HI or A/V hallucinations.  He denied pain or discomfort and appears to be in no physical distress.  He denied any withdrawal symptoms.  He reported that he just feels stuck and that "I am having a mid life crisis, I should be farther in life than I am.  I don't even having any money to get my kids christmas presents."  He took his hs medications without difficulty.  He stayed in the day room watching TV.   A:  1:1 with RN for support and encouragement.  Medications as ordered.  Q 15 minute checks maintained for safety.  Encouraged participation in group and unit activities.   R:  Tramain remains safe on the unit.  We will continue to monitor the progress towards his goals.

## 2018-04-25 NOTE — ED Notes (Signed)
[facility] Expected date:  Expected time:  Means of arrival:  Comments: Res b

## 2018-04-25 NOTE — ED Notes (Signed)
On the phone 

## 2018-04-25 NOTE — Plan of Care (Signed)
  Problem: Education: Goal: Knowledge of [facility] Education information/materials will improve Outcome: Progressing Goal: Verbalization of understanding the information provided will improve Outcome: Progressing   

## 2018-04-25 NOTE — ED Notes (Signed)
Report called, [facility] called.

## 2018-04-25 NOTE — ED Notes (Signed)
Patient given sandwich and drink.  

## 2018-04-25 NOTE — Tx Team (Signed)
Initial Treatment Plan 04/25/2018 6:46 PM Howard Adams [id number]    PATIENT STRESSORS: Marital or family conflict Occupational concerns Substance abuse   PATIENT STRENGTHS: Average or above average intelligence Capable of independent living Communication skills Motivation for treatment/growth Physical Health Supportive family/friends   PATIENT IDENTIFIED PROBLEMS:     "I'm feeling overwhelmed"    "I feel like I'm having a mid Adams crisis"    "I need a change"         DISCHARGE CRITERIA:  Improved stabilization in mood, thinking, and/or behavior Motivation to continue treatment in a less acute level of care Reduction of Adams-threatening or endangering symptoms to within safe limits Verbal commitment to aftercare and medication compliance  PRELIMINARY DISCHARGE PLAN: Attend aftercare/continuing care group Attend 12-step recovery group Outpatient therapy Return to previous living arrangement Return to previous work or school arrangements  PATIENT/FAMILY INVOLVEMENT: This treatment plan has been presented to and reviewed with the patient, Howard Adams The patient has been given the opportunity to ask questions and make suggestions.  Shela NevinValerie S Demitri Kucinski, RN 04/25/2018, 6:46 PM

## 2018-04-26 DIAGNOSIS — F332 Major depressive disorder, recurrent severe without psychotic features: Principal | ICD-10-CM

## 2018-04-26 DIAGNOSIS — F10230 Alcohol dependence with withdrawal, uncomplicated: Secondary | ICD-10-CM

## 2018-04-26 MED ORDER — HYDROXYZINE HCL 50 MG PO TABS: 50.0000 mg | ORAL_TABLET | Freq: Four times a day (QID) | ORAL | 0 refills | Status: DC | PRN

## 2018-04-26 MED ORDER — ESCITALOPRAM OXALATE 10 MG PO TABS
ORAL_TABLET | ORAL | Status: AC
  Filled 2018-04-26: qty 1

## 2018-04-26 MED ORDER — HYDROXYZINE HCL 50 MG PO TABS
50.0000 mg | ORAL_TABLET | Freq: Four times a day (QID) | ORAL | Status: DC | PRN
  Filled 2018-04-26: qty 10

## 2018-04-26 MED ORDER — ESCITALOPRAM OXALATE 10 MG PO TABS
10.0000 mg | ORAL_TABLET | Freq: Every day | ORAL | Status: DC
  Administered 2018-04-26: 10 mg via ORAL
  Filled 2018-04-26 (×2): qty 1

## 2018-04-26 MED ORDER — ESCITALOPRAM OXALATE 10 MG PO TABS: 10.0000 mg | ORAL_TABLET | Freq: Every day | ORAL | 0 refills | Status: DC

## 2018-04-26 NOTE — Progress Notes (Signed)
Recreation Therapy Notes  Date: 12.2.19 Time: 0930 Location: 300 Hall Dayroom  Group Topic: Stress Management  Goal Area(s) Addresses:  Patient will verbalize importance of using healthy stress management.  Patient will identify positive emotions associated with healthy stress management.   Intervention: Stress Management  Activity :  Meditation.  LRT introduced the stress management technique of meditation.  LRT played a meditation dealing with impermanence.  Patients were to listen and follow along as the meditation played to engage in the technique.  Education:  Stress Management, Discharge Planning.   Education Outcome: Acknowledges edcuation/In group clarification offered/Needs additional education  Clinical Observations/Feedback: Pt did not attend group.    Caroll RancherMarjette Eduar Kumpf, LRT/CTRS         Caroll RancherLindsay, Lea Baine A 04/26/2018 11:17 AM

## 2018-04-26 NOTE — H&P (Signed)
Psychiatric Admission Assessment Adult  Patient Identification: Howard Adams MRN:  [id number] Date of Evaluation:  04/26/2018 Chief Complaint:  MDD, single episode Alcohol Use disorder,sev Principal Diagnosis: Major depressive disorder, recurrent severe without psychotic features (HCC) Diagnosis:  Principal Problem:   Major depressive disorder, recurrent severe without psychotic features (HCC) Active Problems:   Alcohol dependence with withdrawal, uncomplicated (HCC)  History of Present Illness:   Howard Adams is a 32 y/o M with history of depression and alcohol use who was admitted from [facility] voluntarily with worsening depression, SI with plan to jump from a bridge, and worsening use of alcohol. Pt was intoxicated at time of assessment in ED with BAL of 350. Pt was medically cleared and started on alcohol withdrawal protocol with librium taper. He was then transferred to [facility] unit for additional treatment and stabilization.  Upon initial interview, pt shares, "I'm just going through a stage of my life. It feels like I should be further than I am. I got children at home. My job is sporadic. I want to provide. I just feel like I'm less of a person - like I'm incompetent." Pt reports that his mood has significantly worsened over the past 2 weeks coinciding with his recent 32th birthday. He cites financial stressors and difficulty finding steady work due to his history of felonies. Pt also reports that he has been struggling with alcohol use. Pt endorses depression symptoms of depressed mood, anhedonia, guilty feelings, and fluctuant appetite. He reports having SI with plan to jump from a bridge/overpass that he walks by often, but he states that those thoughts were momentary and he no longer has them. He told his girlfriend about his thoughts, and she called emergency services which brought him to the hospital. Pt is future oriented about seeking more stable employment, providing for his girlfriend  and her children, and working on his recovery by engaging in the [facility] program of which he is already participating. Pt denies HI/AH/VH. He denies symptoms of mania/hypomania, OCD, and PTSD. He reports that he drinks about 3-5 16-ounce beers daily but in the last few days he has increased his use up to 12 beers and 2 shots of hard alcohol. He smokes 0.5-1 ppd of tobacco. He denies other illicit substance use currently but he has previous use of cannabis and cocaine. He denies physical complaints including symptoms of withdrawal.   Discussed with patient about treatment options. He would like to continue at [facility] for substance use treatment, and we discussed about the employment resources at [facility] to help with finding suitable work locations that will take applicants with a felony history. Pt takes no medications currently, and he has no previous medication trials. He is in agreement to trial of lexapro for treatment of depression and anxiety, and he also agrees to trial of vistaril for as needed treatment of anxiety. Pt requests to discharge as soon as possible. He is significantly future-oriented and denies current SI. He shares that he plans to return to staying at home, and he will also continue with [facility]. He is in agreement to be referred to [facility] for outpatient mental health management. He was able to engage in safety planning including plan to return to [facility] or contact emergency services if he feels unable to maintain his own safety or the safety of others. Pt had no further questions, comments, or concerns.   The patient is at low risk of imminent suicide. Patient denied thoughts, intent, or plan for harm to self or  others, expressed significant future orientation, and expressed an ability to mobilize assistance for his needs. He is presently void of any contributing psychiatric symptoms, cognitive difficulties, or substance use which would elevate his risk for lethality. Chronic risk for lethality is  elevated in light of poor social support, poor adherence, and impulsivity. The chronic risk is presently mitigated by his ongoing desire and engagement in [facility] treatment and mobilization of support from family and friends. Chronic risk may elevate if he experiences any significant loss or worsening of symptoms, which can be managed and monitored through outpatient providers. At this time, acute risk for lethality is low and he is stable for ongoing outpatient management.    Modifiable risk factors were addressed during this hospitalization through appropriate pharmacotherapy and establishment of outpatient follow-up treatment. Some risk factors for suicide are situational (i.e. Unstable social support) or related personality pathology (i.e. Poor coping mechanisms) and thus cannot be further mitigated by continued hospitalization in this setting.   Associated Signs/Symptoms: Depression Symptoms:  depressed mood, anhedonia, feelings of worthlessness/guilt, suicidal thoughts with specific plan, anxiety, (Hypo) Manic Symptoms:  NA Anxiety Symptoms:  Excessive Worry, Psychotic Symptoms:  NA PTSD Symptoms: NA Total Time spent with patient: 1 hour  Past Psychiatric History:  - no previous formal diagnoses - no previous inpatient hospitalizations - no previous outpatient mental health treatement - no previous suicide attempts  Is the patient at risk to self? No.  Has the patient been a risk to self in the past 6 months? Yes.    Has the patient been a risk to self within the distant past? No.  Is the patient a risk to others? No.  Has the patient been a risk to others in the past 6 months? No.  Has the patient been a risk to others within the distant past? No.   Prior Inpatient Therapy:   Prior Outpatient Therapy:    Alcohol Screening: 1. How often do you have a drink containing alcohol?: 4 or more times a week 2. How many drinks containing alcohol do you have on a typical day when you are  drinking?: 3 or 4 3. How often do you have six or more drinks on one occasion?: Daily or almost daily AUDIT-C Score: 9 4. How often during the last year have you found that you were not able to stop drinking once you had started?: Weekly 5. How often during the last year have you failed to do what was normally expected from you becasue of drinking?: Weekly 6. How often during the last year have you needed a first drink in the morning to get yourself going after a heavy drinking session?: Weekly 7. How often during the last year have you had a feeling of guilt of remorse after drinking?: Never 8. How often during the last year have you been unable to remember what happened the night before because you had been drinking?: Monthly 9. Have you or someone else been injured as a result of your drinking?: Yes, during the last year 10. Has a relative or friend or a doctor or another health worker been concerned about your drinking or suggested you cut down?: Yes, during the last year Alcohol Use Disorder Identification Test Final Score (AUDIT): 28 Intervention/Follow-up: Alcohol Education Substance Abuse History in the last 12 months:  Yes.   Consequences of Substance Abuse: Medical Consequences:  worsened mood and anxiety symptoms Previous Psychotropic Medications: No  Psychological Evaluations: No  Past Medical History: History reviewed. No pertinent  past medical history. History reviewed. No pertinent surgical history. Family History: History reviewed. No pertinent family history. Family Psychiatric  History: denies family psychiatric history and family history of suicide attempt/completion.  Tobacco Screening: Have you used any form of tobacco in the last 30 days? (Cigarettes, Smokeless Tobacco, Cigars, and/or Pipes): Yes Tobacco use, Select all that apply: 5 or more cigarettes per day Are you interested in Tobacco Cessation Medications?: No, patient refused Counseled patient on smoking cessation  including recognizing danger situations, developing coping skills and basic information about quitting provided: Refused/Declined practical counseling Social History: Pt was born in the [place] and moved often as a child because his father was in the [employer]. He has lived in the [place] area for the past 11 years. He lives with his girlfriend and her 4 children. He has one biological child (son, age 52 months) with whom he does not have any contact (son lives with his mother). Pt completed his GED. He works for a [employer], physiological work. He has never been married. He has legal history of breaking and entering and carrying a concealed weapon with about 3-4 months of jail time. He denies trauma history. Social History   Substance and Sexual Activity  Alcohol Use Yes  . Alcohol/week: 5.0 standard drinks  . Types: 5 Cans of beer per week   Comment: 4-5 cans a week     Social History   Substance and Sexual Activity  Drug Use No    Additional Social History: Marital status: Long term relationship Long term relationship, how long?: several months What types of issues is patient dealing with in the relationship?: "I am not able to financially provide for her like I would like." Additional relationship information: "my ex won'Adams let me see me 11mo old son because she doesn'Adams like my girlfriend." Are you sexually active?: Yes What is your sexual orientation?: heterosexual Has your sexual activity been affected by drugs, alcohol, medication, or emotional stress?: no Does patient have children?: Yes How many children?: 1 How is patient's relationship with their children?: 19 mo old son. "unable to see him because my ex doesn'Adams like my new girlfriend." " I live with my girlfriend's four kids."                         Allergies:  No Known Allergies Lab Results:  Results for orders placed or performed during the hospital encounter of 04/25/18 (from the past 48 hour(s))   Comprehensive metabolic panel     Status: Abnormal   Collection Time: 04/25/18  1:08 AM  Result Value Ref Range   Sodium 143 135 - 145 mmol/L   Potassium 4.0 3.5 - 5.1 mmol/L   Chloride 112 (H) 98 - 111 mmol/L   CO2 20 (L) 22 - 32 mmol/L   Glucose, Bld 96 70 - 99 mg/dL   BUN 15 6 - 20 mg/dL   Creatinine, Ser 0.63 0.61 - 1.24 mg/dL   Calcium 8.6 (L) 8.9 - 10.3 mg/dL   Total Protein 7.4 6.5 - 8.1 g/dL   Albumin 3.9 3.5 - 5.0 g/dL   AST 28 15 - 41 U/L   ALT 32 0 - 44 U/L   Alkaline Phosphatase 63 38 - 126 U/L   Total Bilirubin 0.2 (L) 0.3 - 1.2 mg/dL   GFR calc non Af Amer >60 >60 mL/min   GFR calc Af Amer >60 >60 mL/min   Anion gap 11 5 - 15  Comment: Performed at [facility], [address], [place], [place] [address]  Ethanol     Status: Abnormal   Collection Time: 04/25/18  1:08 AM  Result Value Ref Range   Alcohol, Ethyl (B) 350 (HH) <10 mg/dL    Comment: CRITICAL RESULT CALLED TO, READ BACK BY AND VERIFIED WITH: MINGIA, K RN @0154  ON 04/25/18 JACKSON,K (NOTE) Lowest detectable limit for serum alcohol is 10 mg/dL. For medical purposes only. Performed at [facility], [address], [place], [place] [address]   Salicylate level     Status: None   Collection Time: 04/25/18  1:08 AM  Result Value Ref Range   Salicylate Lvl 12.2 2.8 - 30.0 mg/dL    Comment: Performed at [facility], [address] [place], [place] [address]  Acetaminophen level     Status: Abnormal   Collection Time: 04/25/18  1:08 AM  Result Value Ref Range   Acetaminophen (Tylenol), Serum <10 (L) 10 - 30 ug/mL    Comment: (NOTE) Therapeutic concentrations vary significantly. A range of 10-30 ug/mL  may be an effective concentration for many patients. However, some  are best treated at concentrations outside of this range. Acetaminophen concentrations >150 ug/mL at 4 hours after ingestion  and >50 ug/mL at 12 hours after ingestion are often  associated with  toxic reactions. Performed at [facility], [address], [place], [place] [address]   cbc     Status: None   Collection Time: 04/25/18  1:08 AM  Result Value Ref Range   WBC 5.4 4.0 - 10.5 K/uL   RBC 4.43 4.22 - 5.81 MIL/uL   Hemoglobin 13.9 13.0 - 17.0 g/dL   HCT 29.5 62.1 - 30.8 %   MCV 95.0 80.0 - 100.0 fL   MCH 31.4 26.0 - 34.0 pg   MCHC 33.0 30.0 - 36.0 g/dL   RDW 65.7 84.6 - 96.2 %   Platelets 179 150 - 400 K/uL   nRBC 0.0 0.0 - 0.2 %    Comment: Performed at [facility], [address] [place], [place] [address]  Rapid urine drug screen (hospital performed)     Status: None   Collection Time: 04/25/18  1:08 AM  Result Value Ref Range   Opiates NONE DETECTED NONE DETECTED   Cocaine NONE DETECTED NONE DETECTED   Benzodiazepines NONE DETECTED NONE DETECTED   Amphetamines NONE DETECTED NONE DETECTED   Tetrahydrocannabinol NONE DETECTED NONE DETECTED   Barbiturates NONE DETECTED NONE DETECTED    Comment: (NOTE) DRUG SCREEN FOR MEDICAL PURPOSES ONLY.  IF CONFIRMATION IS NEEDED FOR ANY PURPOSE, NOTIFY LAB WITHIN 5 DAYS. LOWEST DETECTABLE LIMITS FOR URINE DRUG SCREEN Drug Class                     Cutoff (ng/mL) Amphetamine and metabolites    1000 Barbiturate and metabolites    200 Benzodiazepine                 200 Tricyclics and metabolites     300 Opiates and metabolites        300 Cocaine and metabolites        300 THC                            50 Performed at [facility], [address], [place], [place] [address]   CBG monitoring, ED  Status: None   Collection Time: 04/25/18  1:20 AM  Result Value Ref Range   Glucose-Capillary 94 70 - 99 mg/dL    Blood Alcohol level:  Lab Results  Component Value Date   ETH 350 (HH) 04/25/2018   ETH 260 (H) 10/29/2011    Metabolic Disorder Labs:  No results found for: HGBA1C, MPG No results found for: PROLACTIN No results found for:  CHOL, TRIG, HDL, CHOLHDL, VLDL, LDLCALC  Current Medications: Current Facility-Administered Medications  Medication Dose Route Frequency Provider Last Rate Last Dose  . escitalopram (LEXAPRO) 10 MG tablet           . acetaminophen (TYLENOL) tablet 650 mg  650 mg Oral Q6H PRN Charm RingsLord, Jamison Y, NP      . escitalopram (LEXAPRO) tablet 10 mg  10 mg Oral Daily Micheal Likensainville, Howard Herendeen T, MD      . hydrOXYzine (ATARAX/VISTARIL) tablet 50 mg  50 mg Oral Q6H PRN Micheal Likens[place], Sundiata Ferrick T, MD      . loperamide (IMODIUM) capsule 2-4 mg  2-4 mg Oral PRN Charm RingsLord, Jamison Y, NP      . magnesium hydroxide (MILK OF MAGNESIA) suspension 30 mL  30 mL Oral Daily PRN Charm RingsLord, Jamison Y, NP      . multivitamin with minerals tablet 1 tablet  1 tablet Oral Daily Charm RingsLord, Jamison Y, NP   1 tablet at 04/26/18 0753  . ondansetron (ZOFRAN-ODT) disintegrating tablet 4 mg  4 mg Oral Q6H PRN Charm RingsLord, Jamison Y, NP      . thiamine (B-1) injection 100 mg  100 mg Intramuscular Once Charm RingsLord, Jamison Y, NP      . thiamine (VITAMIN B-1) tablet 100 mg  100 mg Oral Daily Charm RingsLord, Jamison Y, NP   100 mg at 04/26/18 [id number]   PTA Medications: Medications Prior to Admission  Medication Sig Dispense Refill Last Dose  . acetaminophen (TYLENOL) 325 MG tablet Take 2 tablets (650 mg total) by mouth every 6 (six) hours as needed. 40 tablet 0   . albuterol (PROVENTIL HFA;VENTOLIN HFA) 108 (90 Base) MCG/ACT inhaler Inhale 1-2 puffs into the lungs every 6 (six) hours as needed for wheezing or shortness of breath. 1 Inhaler 0   . ibuprofen (ADVIL,MOTRIN) 600 MG tablet Take 1 tablet (600 mg total) by mouth every 6 (six) hours as needed. 30 tablet 0   . methocarbamol (ROBAXIN) 500 MG tablet Take 2 tablets (1,000 mg total) by mouth 2 (two) times daily. 14 tablet 0     Musculoskeletal: Strength & Muscle Tone: within normal limits Gait & Station: normal Patient leans: N/A  Psychiatric Specialty Exam: Physical Exam  Nursing note and vitals reviewed.   Review of  Systems  Constitutional: Negative for chills and fever.  Respiratory: Negative for cough and shortness of breath.   Cardiovascular: Negative for chest pain.  Gastrointestinal: Negative for abdominal pain, heartburn, nausea and vomiting.  Psychiatric/Behavioral: Positive for depression. Negative for hallucinations and suicidal ideas. The patient is nervous/anxious. The patient does not have insomnia.     Blood pressure (!) 143/101, pulse 90, temperature 99.2 F (37.3 C), temperature source Oral, resp. rate 16, height 5' 6.5" (1.689 m), weight 80.7 kg, SpO2 100 %.Body mass index is 28.3 kg/m.  General Appearance: Casual and Fairly Groomed  Eye Contact:  Good  Speech:  Clear and Coherent and Normal Rate  Volume:  Normal  Mood:  Depressed  Affect:  Appropriate, Congruent and Constricted  Thought Process:  Coherent and Goal Directed  Orientation:  Full (Time, Place, and Person)  Thought Content:  Logical  Suicidal Thoughts:  No  Homicidal Thoughts:  No  Memory:  Immediate;   Fair Recent;   Fair Remote;   Fair  Judgement:  Fair  Insight:  Fair  Psychomotor Activity:  Normal  Concentration:  Concentration: Fair  Recall:  Howard Adams of Knowledge:  Fair  Language:  Fair  Akathisia:  No  Handed:    AIMS (if indicated):     Assets:  Resilience Social Support  ADL's:  Intact  Cognition:  WNL  Sleep:  Number of Hours: 5   Treatment Plan Summary:   -Discharge patient to outpatient level of care  Observation Level/Precautions:  15 minute checks  Laboratory:  CBC Chemistry Profile HbAIC UDS UA  Psychotherapy:  Encourage participation in groups and therapeutic milieu   Medications:  Start lexapro 10mg  po qDay. Start vistaril 50mg  po q6h prn anxiety. DC alcohol withdrawal protocol.   Consultations:    Discharge Concerns:    Estimated LOS: discharge to outpatient level of care  Other:     Physician Treatment Plan for Primary Diagnosis: Major depressive disorder, recurrent  severe without psychotic features (HCC) Long Term Goal(s): Improvement in symptoms so as ready for discharge  Short Term Goals: Ability to demonstrate self-control will improve  Physician Treatment Plan for Secondary Diagnosis: Principal Problem:   Major depressive disorder, recurrent severe without psychotic features (HCC) Active Problems:   Alcohol dependence with withdrawal, uncomplicated (HCC)  Long Term Goal(s): Improvement in symptoms so as ready for discharge  Short Term Goals: Ability to identify and develop effective coping behaviors will improve  I certify that inpatient services furnished can reasonably be expected to improve the patient's condition.    Micheal Likens, MD 12/2/201910:16 AM

## 2018-04-26 NOTE — [facility] Suicide Risk Assessment (Signed)
[facility]   Nursing information obtained from:  Patient Demographic factors:  Male, Low socioeconomic status Current Mental Status:  Suicidal ideation indicated by others Loss Factors:  Legal issues, Financial problems / change in socioeconomic status Historical Factors:  NA Risk Reduction Factors:  Positive therapeutic relationship, Living with another person, especially a relative, Responsible for children under 118 years of age, Sense of responsibility to family, Employed, Positive social support  Total Time spent with patient: 1 hour Principal Problem: Major depressive disorder, recurrent severe without psychotic features (HCC) Diagnosis:  Principal Problem:   Major depressive disorder, recurrent severe without psychotic features (HCC) Active Problems:   Alcohol dependence with withdrawal, uncomplicated (HCC)  Subjective Data: see H&P  Continued Clinical Symptoms:  Alcohol Use Disorder Identification Test Final Score (AUDIT): 28 The "Alcohol Use Disorders Identification Test", Guidelines for Use in Primary Care, Second Edition.  World Science writerHealth Organization [facility](WHO). Score between 0-7:  no or low risk or alcohol related problems. Score between 8-15:  moderate risk of alcohol related problems. Score between 16-19:  high risk of alcohol related problems. Score 20 or above:  warrants further diagnostic evaluation for alcohol dependence and treatment.   CLINICAL FACTORS:   Depression:   Comorbid alcohol abuse/dependence Alcohol/Substance Abuse/Dependencies    Psychiatric Specialty Exam: Physical Exam  Nursing note and vitals reviewed.   Review of Systems  Constitutional: Negative for chills and fever.  Respiratory: Negative for cough and shortness of breath.   Cardiovascular: Negative for chest pain.  Gastrointestinal: Negative for abdominal pain, heartburn, nausea and vomiting.  Psychiatric/Behavioral: Positive for depression. Negative  for hallucinations and suicidal ideas. The patient is nervous/anxious. The patient does not have insomnia.     Blood pressure (!) 143/101, pulse 90, temperature 99.2 F (37.3 C), temperature source Oral, resp. rate 16, height 5' 6.5" (1.689 m), weight 80.7 kg, SpO2 100 %.Body mass index is 28.3 kg/m.    COGNITIVE FEATURES THAT CONTRIBUTE TO RISK:  None    SUICIDE RISK:   Minimal: No identifiable suicidal ideation.  Patients presenting with no risk factors but with morbid ruminations; may be classified as minimal risk based on the severity of the depressive symptoms  PLAN OF CARE: see H&P  I certify that inpatient services furnished can reasonably be expected to improve the patient's condition.   Micheal Likenshristopher T Tasmine Hipwell, MD 04/26/2018, 10:48 AM

## 2018-04-26 NOTE — Tx Team (Signed)
Interdisciplinary Treatment and Diagnostic Plan Update  04/26/2018 Time of Session: 0830AM Howard Adams MRN: [id number]  Principal Diagnosis: MDD, recurrent, severe  Secondary Diagnoses: Active Problems:   Major depressive disorder, recurrent severe without psychotic features (HCC)   Current Medications:  Current Facility-Administered Medications  Medication Dose Route Frequency Provider Last Rate Last Dose  . acetaminophen (TYLENOL) tablet 650 mg  650 mg Oral Q6H PRN Charm RingsLord, Jamison Y, NP      . alum & mag hydroxide-simeth (MAALOX/MYLANTA) 200-200-20 MG/5ML suspension 30 mL  30 mL Oral Q4H PRN Charm RingsLord, Jamison Y, NP      . chlordiazePOXIDE (LIBRIUM) capsule 25 mg  25 mg Oral Q6H PRN Charm RingsLord, Jamison Y, NP      . chlordiazePOXIDE (LIBRIUM) capsule 25 mg  25 mg Oral TID Charm RingsLord, Jamison Y, NP   25 mg at 04/26/18 0753   Followed by  . [START ON 04/27/2018] chlordiazePOXIDE (LIBRIUM) capsule 25 mg  25 mg Oral BH-qamhs Lord, Herminio HeadsJamison Y, NP       Followed by  . [START ON 04/28/2018] chlordiazePOXIDE (LIBRIUM) capsule 25 mg  25 mg Oral Daily Lord, Jamison Y, NP      . escitalopram (LEXAPRO) tablet 10 mg  10 mg Oral Daily Micheal Likensainville, Christopher T, MD      . hydrOXYzine (ATARAX/VISTARIL) tablet 25 mg  25 mg Oral Q6H PRN Charm RingsLord, Jamison Y, NP   25 mg at 04/25/18 2151  . loperamide (IMODIUM) capsule 2-4 mg  2-4 mg Oral PRN Charm RingsLord, Jamison Y, NP      . magnesium hydroxide (MILK OF MAGNESIA) suspension 30 mL  30 mL Oral Daily PRN Charm RingsLord, Jamison Y, NP      . multivitamin with minerals tablet 1 tablet  1 tablet Oral Daily Charm RingsLord, Jamison Y, NP   1 tablet at 04/26/18 0753  . ondansetron (ZOFRAN-ODT) disintegrating tablet 4 mg  4 mg Oral Q6H PRN Charm RingsLord, Jamison Y, NP      . thiamine (B-1) injection 100 mg  100 mg Intramuscular Once Charm RingsLord, Jamison Y, NP      . thiamine (VITAMIN B-1) tablet 100 mg  100 mg Oral Daily Charm RingsLord, Jamison Y, NP   100 mg at 04/26/18 [id number]   PTA Medications: Medications Prior to Admission  Medication  Sig Dispense Refill Last Dose  . acetaminophen (TYLENOL) 325 MG tablet Take 2 tablets (650 mg total) by mouth every 6 (six) hours as needed. 40 tablet 0   . albuterol (PROVENTIL HFA;VENTOLIN HFA) 108 (90 Base) MCG/ACT inhaler Inhale 1-2 puffs into the lungs every 6 (six) hours as needed for wheezing or shortness of breath. 1 Inhaler 0   . ibuprofen (ADVIL,MOTRIN) 600 MG tablet Take 1 tablet (600 mg total) by mouth every 6 (six) hours as needed. 30 tablet 0   . methocarbamol (ROBAXIN) 500 MG tablet Take 2 tablets (1,000 mg total) by mouth 2 (two) times daily. 14 tablet 0     Patient Stressors: Marital or family conflict Occupational concerns Substance abuse  Patient Strengths: Average or above average intelligence Capable of independent living Communication skills Motivation for treatment/growth Physical Health Supportive family/friends  Treatment Modalities: Medication Management, Group therapy, Case management,  1 to 1 session with clinician, Psychoeducation, Recreational therapy.   Physician Treatment Plan for Primary Diagnosis: MDD, recurrent, severe  Medication Management: Evaluate patient's response, side effects, and tolerance of medication regimen.  Therapeutic Interventions: 1 to 1 sessions, Unit Group sessions and Medication administration.  Evaluation of Outcomes: Adequate for Discharge  Physician  Treatment Plan for Secondary Diagnosis: Active Problems:   Major depressive disorder, recurrent severe without psychotic features (HCC)  Long Term Goal(s):     Short Term Goals:       Medication Management: Evaluate patient's response, side effects, and tolerance of medication regimen.  Therapeutic Interventions: 1 to 1 sessions, Unit Group sessions and Medication administration.  Evaluation of Outcomes: Adequate for Discharge   RN Treatment Plan for Primary Diagnosis: MDD, recurrent, severe Long Term Goal(s): Knowledge of disease and therapeutic regimen to maintain  health will improve  Short Term Goals: Ability to remain free from injury will improve, Ability to demonstrate self-control and Ability to disclose and discuss suicidal ideas  Medication Management: RN will administer medications as ordered by provider, will assess and evaluate patient's response and provide education to patient for prescribed medication. RN will report any adverse and/or side effects to prescribing provider.  Therapeutic Interventions: 1 on 1 counseling sessions, Psychoeducation, Medication administration, Evaluate responses to treatment, Monitor vital signs and CBGs as ordered, Perform/monitor CIWA, COWS, AIMS and Fall Risk screenings as ordered, Perform wound care treatments as ordered.  Evaluation of Outcomes: Adequate for Discharge   LCSW Treatment Plan for Primary Diagnosis: MDD, recurrent, severe Long Term Goal(s): Safe transition to appropriate next level of care at discharge, Engage patient in therapeutic group addressing interpersonal concerns.  Short Term Goals: Engage patient in aftercare planning with referrals and resources, Facilitate patient progression through stages of change regarding substance use diagnoses and concerns and Identify triggers associated with mental health/substance abuse issues  Therapeutic Interventions: Assess for all discharge needs, 1 to 1 time with Social worker, Explore available resources and support systems, Assess for adequacy in community support network, Educate family and significant other(s) on suicide prevention, Complete Psychosocial Assessment, Interpersonal group therapy.  Evaluation of Outcomes: Adequate for Discharge   Progress in Treatment: Attending groups: Yes. Participating in groups: Yes. Taking medication as prescribed: Yes. Toleration medication: Yes. Family/Significant other contact made: No, will contact:  pt's significant other Patient understands diagnosis: Yes. Discussing patient identified problems/goals  with staff: Yes. Medical problems stabilized or resolved: Yes. Denies suicidal/homicidal ideation: Yes. Issues/concerns per patient self-inventory: No. Other: n/a  New problem(s) identified: No, Describe:  n/a  New Short Term/Long Term Goal(s): detox, medication management for mood stabilization; elimination of SI thoughts; development of comprehensive mental wellness/sobriety plan.   Patient Goals:  "To learn how to cope better with feeling depressed and start a medication."  Discharge Plan or Barriers: Pt plans to return home and follow-up at [facility]. He is involved in [facility] program and will resume treatment through [facility] on Tuesday 12/3. MHAG pamphlet, Mobile Crisis information, and AA/NA information provided to patient for additional community support and resources.   Reason for Continuation of Hospitalization: none  Estimated Length of Stay: today, 12/2  Attendees: Patient: 04/26/2018 9:26 AM  Physician: Dr. Altamese Niceville MD 04/26/2018 9:26 AM  Nursing: Lincoln Maxin RN; Herbert Seta RN 04/26/2018 9:26 AM  RN Care Manager:x 04/26/2018 9:26 AM  Social Worker: Corrie Mckusick LCSW 04/26/2018 9:26 AM  Recreational Therapist: x 04/26/2018 9:26 AM  Other: Armandina Stammer NP 04/26/2018 9:26 AM  Other:  04/26/2018 9:26 AM  Other: 04/26/2018 9:26 AM    Scribe for Treatment Team: Rona Ravens, LCSW 04/26/2018 9:26 AM

## 2018-04-26 NOTE — Progress Notes (Addendum)
  [facility] :  Will you be returning to the same living situation after discharge:  Yes,  home with girlfriend At discharge, do you have transportation home?: Yes,  girlfriend coming at 1:30PM to pick him up. Do you have the ability to pay for your medications: Yes,  mental health  Release of information consent forms completed and submitted to medical records by CSW.   Patient to Follow up at: Follow-up Information    [facility]. Go on 04/30/2018.   Specialty:  Behavioral Health Why:  Your hospital follow up appointment is Friday, 04/30/18 at 8:00a.  Please bring: photo ID, proof of insurance, social security card, and discharge paperwork from this hospitalization.   Contact information: [address] [place] [address] [phone]           Next level of care provider has access to [facility] Link:no  Safety Planning and Suicide Prevention discussed: Yes,  SPE completed with pt and his significant other. SPI pamphlet and mobile crisis information provided to pt.   Have you used any form of tobacco in the last 30 days? (Cigarettes, Smokeless Tobacco, Cigars, and/or Pipes): Yes  Has patient been referred to the Quitline?: Patient refused referral  Patient has been referred for addiction treatment: Yes  Rona RavensHeather S Sherrel Ploch, LCSW 04/26/2018, 12:27 PM

## 2018-04-26 NOTE — [facility] Suicide Risk Assessment (Signed)
[facility] INPATIENT:  Family/Significant Other Suicide Prevention Education  Suicide Prevention Education:  Education Completed; Howard Adams (pt's girlfriend) [phone] has been identified by the patient as the family member/significant other with whom the patient will be residing, and identified as the person(s) who will aid the patient in the event of a mental health crisis (suicidal ideations/suicide attempt).  With written consent from the patient, the family member/significant other has been provided the following suicide prevention education, prior to the and/or following the discharge of the patient.  The suicide prevention education provided includes the following:  Suicide risk factors  Suicide prevention and interventions  National Suicide Hotline telephone number  [facility] assessment telephone number  [facility] Emergency Assistance 911  [facility] and/or Residential Mobile Crisis Unit telephone number  Request made of family/significant other to:  Remove weapons (e.g., guns, rifles, knives), all items previously/currently identified as safety concern.    Remove drugs/medications (over-the-counter, prescriptions, illicit drugs), all items previously/currently identified as a safety concern.  The family member/significant other verbalizes understanding of the suicide prevention education information provided.  The family member/significant other agrees to remove the items of safety concern listed above.  Rona RavensHeather S Jerris Keltz LCSW 04/26/2018, 11:18 AM

## 2018-04-26 NOTE — [facility] Counselor (Signed)
Adult Comprehensive Assessment  Patient ID: Howard Adams, male   DOB: 1985/12/29, 32 y.o.   MRN: [id number]  Information Source: Information source: Patient  Current Stressors:  Patient states their primary concerns and needs for treatment are:: "I am having a midlife crisis at 32 and don't feel I am where I should be in life." low self esteem; passive SI. Patient states their goals for this hospitilization and ongoing recovery are:: "get help with depression, find direction in life." Educational / Learning stressors: GED Employment / Job issues: works Copy at [employer] Family Relationships: Clinical cytogeneticist / Lack of resources (include bankruptcy): limited income Physical health (include injuries & life threatening diseases): none identified Social relationships: fair Substance abuse: 12pk daily --increase in drinking since birthday a few weeks ago. hx marijuana and cocaine use.  Living/Environment/Situation:  Living Arrangements: Spouse/significant other, Children Living conditions (as described by patient or guardian): lives in home Who else lives in the home?: girlfriend and girlfriend's four kids How long has patient lived in current situation?: several months What is atmosphere in current home: Loving, Comfortable, Supportive  Family History:  Marital status: Long term relationship Long term relationship, how long?: several months What types of issues is patient dealing with in the relationship?: "I am not able to financially provide for her like I would like." Additional relationship information: "my ex won't let me see me 55mo old son because she doesn't like my girlfriend." Are you sexually active?: Yes What is your sexual orientation?: heterosexual Has your sexual activity been affected by drugs, alcohol, medication, or emotional stress?: no Does patient have children?: Yes How many children?: 1 How is patient's relationship with their children?: 6 mo old son.  "unable to see him because my ex doesn't like my new girlfriend." " I live with my girlfriend's four kids."  Childhood History:  By whom was/is the patient raised?: Both parents Additional childhood history information: "[place]--my parents were in the military." Description of patient's relationship with caregiver when they were a child: close to both parents Patient's description of current relationship with people who raised him/her: strained with parents How were you disciplined when you got in trouble as a child/adolescent?: grounded; whoopings Did patient suffer any verbal/emotional/physical/sexual abuse as a child?: No Did patient suffer from severe childhood neglect?: No Has patient ever been sexually abused/assaulted/raped as an adolescent or adult?: No Was the patient ever a victim of a crime or a disaster?: No Witnessed domestic violence?: No Has patient been effected by domestic violence as an adult?: No  Education:  Highest grade of school patient has completed: GED Currently a Consulting civil engineer?: No Learning disability?: No  Employment/Work Situation:   Employment situation: Employed 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?: No Are There Guns or Other Weapons in Your Home?: No Are These Weapons Safely Secured?: (n/a)  Financial Resources:   Financial resources: Income from employment, Income from spouse Does patient have a representative payee or guardian?: No  Alcohol/Substance Abuse:   What has been your use of drugs/alcohol within the last 12 months?: recent increase in alcohol use--up  to 12pk daily. BAL .  350 on admission to ED. hx cocaine and marijuana abuse but not currently. If attempted suicide, did drugs/alcohol play a role in this?: No(passive SI with thoughts to jump from bridge over the past few weeks. "never had these thoughts before.") Alcohol/Substance Abuse Treatment Hx: Denies past history Has alcohol/substance abuse ever caused legal problems?: Yes(breaking and entering; carying concealed weapon. In task program currently. spent time in prison due to felony convictions)  Social Support System:   Patient's Community Support System: [employer] Museum/gallery exhibitions officerDescribe Community Support System: some friends and supportive family Type of faith/religion: none How does patient's faith help to cope with current illness?: n/a  Leisure/Recreation:   Leisure and Hobbies: "I just go to work and come home. I don't do much for hobbies."   Strengths/Needs:   What is the patient's perception of their strengths?: "I really want to do well in my life and get a good job." Patient states they can use these personal strengths during their treatment to contribute to their recovery: "I want to get out of here and work with my [facility] counselor." Patient states these barriers may affect/interfere with their treatment: none identified Patient states these barriers may affect their return to the community: none identified Other important information patient would like considered in planning for their treatment: none identified  Discharge Plan:   Currently receiving community mental health services: No Patient states concerns and preferences for aftercare planning are: [facility] and resume counseling through [facility] Patient states they will know when they are safe and ready for discharge when: "I'm ready now. I really want to discharge. I have a [facility] appt tomorrow."  Does patient have access to transportation?: Yes(bus and walk) Does patient have financial barriers related to  discharge medications?: Yes Patient description of barriers related to discharge medications: limited income and no insurance Will patient be returning to same living situation after discharge?: Yes(home with girlfriend)  Summary/Recommendations:   Emergency planning/management officerummary and Recommendations (to be completed by the evaluator): [place] 32[place] with his girlfriend and her children. Pt presents to the hospital due to alcohol intoxication, SI with thoughts to jump off a bridge, increased depression due to turning 32 and not feeling like he is where he wants to be in life, and for medicaiton stabilization. Pt currently denies SI/HI/AVh. He has a diagnosis of MDD and Alcohol Use Disorder. 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 Recommendations to pt include: crisis stabilization, therapeutic milieu, encourage group attendance and participation, medication management for mood stabilization/detox, and development of comprehensive mental wellness/sobriety plan. Pt plans to return home and is agreeable [facility].   Rona RavensHeather S Sirenia Whitis LCSW 04/26/2018 9:25 AM
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
## 2018-04-26 NOTE — Progress Notes (Signed)
D: Pt A & O X . Denies SI, HI, AVH and pain at this time. D/C home as ordered. Pt d/c home as ordered. Pt rates his depression 6/10, hopelessness 7/10 and anxiety 2/10. Reports he slept good last night with good appetite, low energy and good concentration level. Bus pass given for transportation home. A: D/C instructions reviewed with pt including prescriptions, medication samples and follow up appointment; compliance encouraged. All belongings from locker # 23given to pt at time of departure. Scheduled and PRN medications given with verbal education and effects monitored. Safety checks maintained without incident till time of d/c.  R: Pt receptive to care. Compliant with medications when offered. Denies adverse drug reactions when assessed. Verbalized understanding related to d/c instructions. Signed belonging sheet in agreement with items received from locker. Ambulatory with a steady gait. Appears to be in no physical distress at time of departure.

## 2018-07-26 ENCOUNTER — Encounter (HOSPITAL_COMMUNITY): Payer: Self-pay

## 2018-07-26 ENCOUNTER — Emergency Department (HOSPITAL_COMMUNITY)
Admission: EM | Admit: 2018-07-26 | Discharge: 2018-07-26 | Disposition: A | Discharge status: Home / Self Care | Payer: Self-pay | Attending: Emergency Medicine | Admitting: Emergency Medicine

## 2018-07-26 ENCOUNTER — Other Ambulatory Visit: Payer: Self-pay

## 2018-07-26 DIAGNOSIS — R05 Cough: Secondary | ICD-10-CM | POA: Insufficient documentation

## 2018-07-26 DIAGNOSIS — J029 Acute pharyngitis, unspecified: Secondary | ICD-10-CM | POA: Insufficient documentation

## 2018-07-26 DIAGNOSIS — Z5321 Procedure and treatment not carried out due to patient leaving prior to being seen by health care provider: Secondary | ICD-10-CM | POA: Insufficient documentation

## 2018-07-26 LAB — GROUP A STREP BY PCR: Group A Strep by PCR: NOT DETECTED

## 2018-07-26 NOTE — ED Notes (Signed)
Called patient to move to room. Unable to locate patient at this time. 

## 2018-07-26 NOTE — ED Triage Notes (Signed)
Pt here for a sore throat and cough for the last 3 days.  Pt A&Ox4 ambulatory to triage.

## 2018-07-26 NOTE — ED Notes (Signed)
CALLED FOR ROOM X3 WITH NO ANSWER

## 2019-02-19 ENCOUNTER — Other Ambulatory Visit: Payer: Self-pay

## 2019-02-19 ENCOUNTER — Encounter (HOSPITAL_COMMUNITY): Payer: Self-pay

## 2019-02-19 ENCOUNTER — Emergency Department (HOSPITAL_COMMUNITY)
Admission: EM | Admit: 2019-02-19 | Discharge: 2019-02-19 | Disposition: A | Discharge status: Home / Self Care | Payer: Self-pay | Attending: Emergency Medicine | Admitting: Emergency Medicine

## 2019-02-19 DIAGNOSIS — Z79899 Other long term (current) drug therapy: Secondary | ICD-10-CM | POA: Insufficient documentation

## 2019-02-19 DIAGNOSIS — F32A Depression, unspecified: Secondary | ICD-10-CM

## 2019-02-19 DIAGNOSIS — F1010 Alcohol abuse, uncomplicated: Secondary | ICD-10-CM

## 2019-02-19 DIAGNOSIS — F1024 Alcohol dependence with alcohol-induced mood disorder: Secondary | ICD-10-CM | POA: Insufficient documentation

## 2019-02-19 DIAGNOSIS — F329 Major depressive disorder, single episode, unspecified: Secondary | ICD-10-CM

## 2019-02-19 DIAGNOSIS — F17200 Nicotine dependence, unspecified, uncomplicated: Secondary | ICD-10-CM | POA: Insufficient documentation

## 2019-02-19 LAB — ETHANOL: Alcohol, Ethyl (B): 339 mg/dL (ref <10)

## 2019-02-19 LAB — COMPREHENSIVE METABOLIC PANEL
ALT: 49 U/L — ABNORMAL HIGH (ref 0–44)
AST: 47 U/L — ABNORMAL HIGH (ref 15–41)
Albumin: 3.9 g/dL (ref 3.5–5.0)
Alkaline Phosphatase: 60 U/L (ref 38–126)
Anion gap: 12 (ref 5–15)
BUN: 8 mg/dL (ref 6–20)
CO2: 20 mmol/L — ABNORMAL LOW (ref 22–32)
Calcium: 9.2 mg/dL (ref 8.9–10.3)
Chloride: 110 mmol/L (ref 98–111)
Creatinine, Ser: 0.89 mg/dL (ref 0.61–1.24)
GFR calc Af Amer: >60 mL/min (ref >60)
GFR calc non Af Amer: >60 mL/min (ref >60)
Glucose, Bld: 115 mg/dL — ABNORMAL HIGH (ref 70–99)
Potassium: 3.8 mmol/L (ref 3.5–5.1)
Sodium: 142 mmol/L (ref 135–145)
Total Bilirubin: 0.5 mg/dL (ref 0.3–1.2)
Total Protein: 7.2 g/dL (ref 6.5–8.1)

## 2019-02-19 LAB — CBC
HCT: 44.6 % (ref 39.0–52.0)
Hemoglobin: 15.2 g/dL (ref 13.0–17.0)
MCH: 32.1 pg (ref 26.0–34.0)
MCHC: 34.1 g/dL (ref 30.0–36.0)
MCV: 94.3 fL (ref 80.0–100.0)
Platelets: 244 10*3/uL (ref 150–400)
RBC: 4.73 MIL/uL (ref 4.22–5.81)
RDW: 12.1 % (ref 11.5–15.5)
WBC: 4.1 10*3/uL (ref 4.0–10.5)
nRBC: 0 % (ref 0.0–0.2)

## 2019-02-19 LAB — RAPID URINE DRUG SCREEN, HOSP PERFORMED
Amphetamines: NOT DETECTED
Barbiturates: NOT DETECTED
Benzodiazepines: NOT DETECTED
Cocaine: NOT DETECTED
Opiates: NOT DETECTED
Tetrahydrocannabinol: NOT DETECTED

## 2019-02-19 LAB — SALICYLATE LEVEL: Salicylate Lvl: <7 mg/dL (ref 2.8–30.0)

## 2019-02-19 LAB — ACETAMINOPHEN LEVEL: Acetaminophen (Tylenol), Serum: <10 ug/mL — ABNORMAL LOW (ref 10–30)

## 2019-02-19 NOTE — ED Notes (Signed)
Dinner meal tray ordered.

## 2019-02-19 NOTE — ED Triage Notes (Signed)
Patient states that he increased stress and is having suicidal and homicidal thoughts. States that he cannot see his child until he gets help. Patient tearful and flat affect. Reports hx of depression but has never followed up. ETOH use daily-last drink 1 hour pta

## 2019-02-19 NOTE — ED Notes (Signed)
Pt witnessed changing into hospital scrubs out of all personal belongings except underwear. Pt wanded by security. Pt belongings inventoried and placed in locker #3. Valuables checked in with security.

## 2019-02-19 NOTE — ED Notes (Signed)
Pt arrived to Rm 53 - ambulatory - wearing burgundy scrubs. No Sitter available for pt Chief Financial Officer and J Blue-Matthews, Agricultural consultant, aware.

## 2019-02-19 NOTE — ED Notes (Signed)
Pt aware of tx plan - d/c to home - waiting on [facility] SW to complete note. Pt given Sprite and [place] sandwich x 2 each as requested. Pt declines for RN to call his grandmother as she had requested. States he will ride bus from ED.

## 2019-02-19 NOTE — ED Notes (Signed)
ALL belongings - 2 labeled belongings bags & 1 valuables envelope - returned to pt - Pt signed verifying all items present. D/C paperwork given w/Outpt resources and Peer Support's phone number. Pt voiced understanding.

## 2019-02-19 NOTE — ED Notes (Signed)
Per Maudie Mercury, [facility] SW, pt is psych cleared. Pt to be given Aaron Edelman, Peer Support, number - [phone].
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
## 2019-02-19 NOTE — ED Notes (Signed)
Pt stated he had to go smoke his cigarette and walked out of triage area. This NT made attempt to stop pt.

## 2019-02-19 NOTE — Discharge Instructions (Signed)
It is very important that you seek help for both your alcohol issues and your depression.  Numbers were provided for you.  Also peer support can be a very helpful tool in helping you to remain sober.

## 2019-02-19 NOTE — BH Assessment (Signed)
Tele Assessment Note   Patient Name: Howard Adams MRN: [id number] Referring Physician: Gwyneth Sprout, MD  Location of Patient: [facility] Location of Provider: Behavioral Health TTS Department  Howard Adams is an 33 y.o. male present to the ED with complaints of depression and excessive drinking. Patient denied suicidal / homicidal ideations, denied auditory / visual hallucinations. Denied feelings of paranoid. Patient report he's seeking assistance for his drinking at the request of the unborn child's mother who due to 36 - 14 days. Report she has threatened not to allow him to see his child if he does not get help for his drinking. Report drinking daily 4x to 6x 16oz beers. Report last used cocaine two months ago. Patient stated, "I am not suicidal, I am depressed and frustrated." Patient admits to have anger issues stating, "I just want to punch someone."   Peer support was contacted, the patient was provided peer support number to contact for community resources.   Howard Drape, NP, recommend patient be psych-cleared.    Diagnosis:  F10.20   Alcohol use disorder, Severe                     F10.24   Alcohol-induced depressive disorder, With moderate or severe use disorder  Past Medical History: History reviewed. No pertinent past medical history.  History reviewed. No pertinent surgical history.  Family History: No family history on file.  Social History:  reports that he has been smoking. He has a 10.00 pack-year smoking history. He has never used smokeless tobacco. He reports current alcohol use of about 5.0 standard drinks of alcohol per week. He reports that he does not use drugs.  Additional Social History:  Alcohol / Drug Use Pain Medications: see MAR Prescriptions: see MAR Over the Counter: see MAR History of alcohol / drug use?: Yes Substance #1 Name of Substance 1: alcohol 1 - Age of First Use: 21 1 - Frequency: daily 1 - Duration: ongoing 1 - Last Use / Amount:  02/19/2019  CIWA: CIWA-Ar BP: (!) 151/102 Pulse Rate: (!) 115 COWS:    Allergies: No Known Allergies  Home Medications: (Not in a hospital admission)   OB/GYN Status:  No LMP for male patient.  General Assessment Data Assessment unable to be completed: Yes Reason for not completing assessment: patient in the waiting area, not roomed. Alcohol level 339 Location of Assessment: [facility] ED TTS Assessment: In system Is this a Tele or Face-to-Face Assessment?: Tele Assessment Is this an Initial Assessment or a Re-assessment for this encounter?: Initial Assessment Patient Accompanied by:: N/A(alone) Language Other than English: No Living Arrangements: Other (Comment) What gender do you identify as?: Male Marital status: Single Maiden name: n/a Living Arrangements: Spouse/significant other, Children Can pt return to current living arrangement?: Yes Admission Status: Voluntary Is patient capable of signing voluntary admission?: Yes Referral Source: Self/Family/Friend Insurance type: no insurance      Crisis Care Plan Living Arrangements: Spouse/significant other, Children Name of Psychiatrist: none report  Name of Therapist: none report   Education Status Is patient currently in school?: No Is the patient employed, unemployed or receiving disability?: Unemployed  Risk to self with the past 6 months Suicidal Ideation: No Has patient been a risk to self within the past 6 months prior to admission? : No Suicidal Intent: No Has patient had any suicidal intent within the past 6 months prior to admission? : No Is patient at risk for suicide?: No Suicidal Plan?: No Has patient had any suicidal  plan within the past 6 months prior to admission? : No Access to Means: No What has been your use of drugs/alcohol within the last 12 months?: alcohol  Previous Attempts/Gestures: No How many times?: 0 Other Self Harm Risks: drinking  Triggers for Past Attempts: Other (Comment)(depression  ) Intentional Self Injurious Behavior: Damaging(drinking ) Comment - Self Injurious Behavior: drinking alcohol daily  Family Suicide History: No Recent stressful life event(s): Conflict (Comment)(conflict with others ) Persecutory voices/beliefs?: No Depression: Yes Depression Symptoms: Feeling angry/irritable Substance abuse history and/or treatment for substance abuse?: No Suicide prevention information given to non-admitted patients: Not applicable  Risk to Others within the past 6 months Homicidal Ideation: No Does patient have any lifetime risk of violence toward others beyond the six months prior to admission? : No Thoughts of Harm to Others: No Current Homicidal Intent: No Current Homicidal Plan: No Access to Homicidal Means: No Identified Victim: n/a History of harm to others?: No Assessment of Violence: On admission(self-report anger issues ) Violent Behavior Description: report history of anger issues  Does patient have access to weapons?: No Criminal Charges Pending?: No Does patient have a court date: No Is patient on probation?: No  Psychosis Hallucinations: None noted Delusions: None noted  Mental Status Report Appearance/Hygiene: In scrubs Eye Contact: Good Motor Activity: Freedom of movement Speech: Logical/coherent Level of Consciousness: Alert Mood: Pleasant Affect: Appropriate to circumstance Anxiety Level: None Thought Processes: Coherent, Relevant Judgement: Unimpaired Orientation: Person, Place, Time, Situation Obsessive Compulsive Thoughts/Behaviors: None  Cognitive Functioning Concentration: Good Memory: Recent Intact, Remote Intact Is patient IDD: No Insight: Poor Impulse Control: Poor Appetite: Fair Have you had any weight changes? : No Change Sleep: Decreased Total Hours of Sleep: 5(4 or 5 hours of sleep ) Vegetative Symptoms: None  ADLScreening [facility] Assessment Services) Patient's cognitive ability adequate to safely complete daily  activities?: Yes Patient able to express need for assistance with ADLs?: Yes Independently performs ADLs?: Yes (appropriate for developmental age)  Prior Inpatient Therapy Prior Inpatient Therapy: Yes Prior Therapy Dates: 04/2018 Prior Therapy Facilty/Provider(s): [facility] Reason for Treatment: Depression   Prior Outpatient Therapy Prior Outpatient Therapy: No Does patient have an ACCT team?: No Does patient have Intensive In-House Services?  : No Does patient have [facility] services? : No Does patient have P4CC services?: No  ADL Screening (condition at time of admission) Patient's cognitive ability adequate to safely complete daily activities?: Yes Is the patient deaf or have difficulty hearing?: No Does the patient have difficulty seeing, even when wearing glasses/contacts?: No Does the patient have difficulty concentrating, remembering, or making decisions?: No Patient able to express need for assistance with ADLs?: Yes Does the patient have difficulty dressing or bathing?: No Independently performs ADLs?: Yes (appropriate for developmental age) Does the patient have difficulty walking or climbing stairs?: No       Abuse/Neglect Assessment (Assessment to be complete while patient is alone) Abuse/Neglect Assessment Can Be Completed: Yes Physical Abuse: Denies Verbal Abuse: Denies Sexual Abuse: Denies Exploitation of patient/patient's resources: Denies Self-Neglect: Denies     Regulatory affairs officer (For Healthcare) Does Patient Have a Medical Advance Directive?: No Would patient like information on creating a medical advance directive?: No - Patient declined          Disposition:  Disposition Initial Assessment Completed for this Encounter: Katrine Coho, NP, patient is psyh-cleared w/ resources )   Despina Hidden 02/19/2019 5:11 PM

## 2019-02-19 NOTE — ED Notes (Signed)
Staffing Office advised no Sitter available for pt for until 1900 this evening.

## 2019-02-19 NOTE — ED Notes (Signed)
TTS being performed.  

## 2019-03-27 ENCOUNTER — Emergency Department (HOSPITAL_COMMUNITY): Payer: Self-pay

## 2019-03-27 ENCOUNTER — Other Ambulatory Visit: Payer: Self-pay

## 2019-03-27 ENCOUNTER — Emergency Department (HOSPITAL_COMMUNITY)
Admission: EM | Admit: 2019-03-27 | Discharge: 2019-03-27 | Disposition: A | Discharge status: Home / Self Care | Payer: Self-pay | Attending: Emergency Medicine | Admitting: Emergency Medicine

## 2019-03-27 ENCOUNTER — Encounter (HOSPITAL_COMMUNITY): Payer: Self-pay

## 2019-03-27 DIAGNOSIS — F10920 Alcohol use, unspecified with intoxication, uncomplicated: Secondary | ICD-10-CM | POA: Diagnosis not present

## 2019-03-27 DIAGNOSIS — Y929 Unspecified place or not applicable: Secondary | ICD-10-CM | POA: Diagnosis not present

## 2019-03-27 DIAGNOSIS — Y999 Unspecified external cause status: Secondary | ICD-10-CM | POA: Insufficient documentation

## 2019-03-27 DIAGNOSIS — S81011A Laceration without foreign body, right knee, initial encounter: Secondary | ICD-10-CM | POA: Diagnosis not present

## 2019-03-27 DIAGNOSIS — Y908 Blood alcohol level of 240 mg/100 ml or more: Secondary | ICD-10-CM | POA: Diagnosis not present

## 2019-03-27 DIAGNOSIS — S060X0A Concussion without loss of consciousness, initial encounter: Secondary | ICD-10-CM | POA: Insufficient documentation

## 2019-03-27 DIAGNOSIS — Z23 Encounter for immunization: Secondary | ICD-10-CM | POA: Insufficient documentation

## 2019-03-27 DIAGNOSIS — J45909 Unspecified asthma, uncomplicated: Secondary | ICD-10-CM | POA: Diagnosis not present

## 2019-03-27 DIAGNOSIS — Y939 Activity, unspecified: Secondary | ICD-10-CM | POA: Insufficient documentation

## 2019-03-27 DIAGNOSIS — S060X1A Concussion with loss of consciousness of 30 minutes or less, initial encounter: Secondary | ICD-10-CM

## 2019-03-27 DIAGNOSIS — T1490XA Injury, unspecified, initial encounter: Secondary | ICD-10-CM

## 2019-03-27 DIAGNOSIS — F17210 Nicotine dependence, cigarettes, uncomplicated: Secondary | ICD-10-CM | POA: Insufficient documentation

## 2019-03-27 HISTORY — DX: Unspecified asthma, uncomplicated: J45.909

## 2019-03-27 LAB — PROTIME-INR
INR: 0.8 (ref 0.8–1.2)
Prothrombin Time: 11.5 seconds (ref 11.4–15.2)

## 2019-03-27 LAB — CBC
HCT: 44.2 % (ref 39.0–52.0)
Hemoglobin: 14.9 g/dL (ref 13.0–17.0)
MCH: 31.5 pg (ref 26.0–34.0)
MCHC: 33.7 g/dL (ref 30.0–36.0)
MCV: 93.4 fL (ref 80.0–100.0)
Platelets: 207 10*3/uL (ref 150–400)
RBC: 4.73 MIL/uL (ref 4.22–5.81)
RDW: 11.7 % (ref 11.5–15.5)
WBC: 4.6 10*3/uL (ref 4.0–10.5)
nRBC: 0 % (ref 0.0–0.2)

## 2019-03-27 LAB — COMPREHENSIVE METABOLIC PANEL
ALT: 29 U/L (ref 0–44)
AST: 29 U/L (ref 15–41)
Albumin: 3.7 g/dL (ref 3.5–5.0)
Alkaline Phosphatase: 60 U/L (ref 38–126)
Anion gap: 11 (ref 5–15)
BUN: 10 mg/dL (ref 6–20)
CO2: 18 mmol/L — ABNORMAL LOW (ref 22–32)
Calcium: 8.9 mg/dL (ref 8.9–10.3)
Chloride: 111 mmol/L (ref 98–111)
Creatinine, Ser: 1.06 mg/dL (ref 0.61–1.24)
GFR calc Af Amer: >60 mL/min (ref >60)
GFR calc non Af Amer: >60 mL/min (ref >60)
Glucose, Bld: 122 mg/dL — ABNORMAL HIGH (ref 70–99)
Potassium: 3.5 mmol/L (ref 3.5–5.1)
Sodium: 140 mmol/L (ref 135–145)
Total Bilirubin: 0.2 mg/dL — ABNORMAL LOW (ref 0.3–1.2)
Total Protein: 6.9 g/dL (ref 6.5–8.1)

## 2019-03-27 LAB — I-STAT CHEM 8, ED
BUN: 9 mg/dL (ref 6–20)
Calcium, Ion: 1.04 mmol/L — ABNORMAL LOW (ref 1.15–1.40)
Chloride: 111 mmol/L (ref 98–111)
Creatinine, Ser: 1.4 mg/dL — ABNORMAL HIGH (ref 0.61–1.24)
Glucose, Bld: 116 mg/dL — ABNORMAL HIGH (ref 70–99)
HCT: 44 % (ref 39.0–52.0)
Hemoglobin: 15 g/dL (ref 13.0–17.0)
Potassium: 3.6 mmol/L (ref 3.5–5.1)
Sodium: 143 mmol/L (ref 135–145)
TCO2: 19 mmol/L — ABNORMAL LOW (ref 22–32)

## 2019-03-27 LAB — CDS SEROLOGY

## 2019-03-27 LAB — SAMPLE TO BLOOD BANK

## 2019-03-27 LAB — ETHANOL: Alcohol, Ethyl (B): 305 mg/dL (ref <10)

## 2019-03-27 MED ORDER — NAPROXEN 500 MG PO TABS: 500.0000 mg | ORAL_TABLET | Freq: Two times a day (BID) | ORAL | 0 refills | Status: DC

## 2019-03-27 MED ORDER — IOHEXOL 300 MG/ML  SOLN
100.0000 mL | Freq: Once | INTRAMUSCULAR | Status: AC | PRN
  Administered 2019-03-27: 100 mL via INTRAVENOUS

## 2019-03-27 MED ORDER — LORAZEPAM 2 MG/ML IJ SOLN
1.0000 mg | Freq: Once | INTRAMUSCULAR | Status: AC
  Administered 2019-03-27: 1 mg via INTRAVENOUS
  Filled 2019-03-27: qty 1

## 2019-03-27 MED ORDER — LIDOCAINE HCL (PF) 1 % IJ SOLN
30.0000 mL | Freq: Once | INTRAMUSCULAR | Status: AC
  Administered 2019-03-27: 01:00:00 30 mL

## 2019-03-27 MED ORDER — NICOTINE 21 MG/24HR TD PT24
21.0000 mg | MEDICATED_PATCH | Freq: Every day | TRANSDERMAL | Status: DC
  Administered 2019-03-27: 21 mg via TRANSDERMAL
  Filled 2019-03-27: qty 1

## 2019-03-27 MED ORDER — MORPHINE SULFATE (PF) 4 MG/ML IV SOLN
4.0000 mg | Freq: Once | INTRAVENOUS | Status: AC
  Administered 2019-03-27: 4 mg via INTRAVENOUS

## 2019-03-27 MED ORDER — TETANUS-DIPHTH-ACELL PERTUSSIS 5-2.5-18.5 LF-MCG/0.5 IM SUSP: 0.5000 mL | Freq: Once | INTRAMUSCULAR | Status: DC

## 2019-03-27 MED ORDER — TETANUS-DIPHTH-ACELL PERTUSSIS 5-2.5-18.5 LF-MCG/0.5 IM SUSP
0.5000 mL | Freq: Once | INTRAMUSCULAR | Status: AC
  Administered 2019-03-27: 0.5 mL via INTRAMUSCULAR

## 2019-03-27 MED ORDER — IBUPROFEN 800 MG PO TABS
800.0000 mg | ORAL_TABLET | Freq: Once | ORAL | Status: AC
  Administered 2019-03-27: 05:00:00 800 mg via ORAL
  Filled 2019-03-27: qty 1
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
## 2019-03-27 NOTE — Progress Notes (Signed)
Chaplain responded to a level 2 trauma page. Howard Adams was alert and talking. He repeated himself a lot. Pt was very agitated. Chaplain called Sian' girlfriend Marva. She is now at the bedside, Chaplain assisted in keeping [name] calm during the stapling of his knee wound. Chaplain remains available for support as needed.   Chaplain Resident, Evelene Croon, M Div
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
## 2019-03-27 NOTE — Progress Notes (Signed)
Orthopedic Tech Progress Note Patient Details:  Howard Adams January 23, 1986 [id number]  Ortho Devices Type of Ortho Device: Crutches, Knee Immobilizer Ortho Device/Splint Location: lle Ortho Device/Splint Interventions: Ordered, Application, Adjustment   Post Interventions Patient Tolerated: Well Instructions Provided: Care of device, Adjustment of device   [name] 03/27/2019, 6:11 AM

## 2019-03-27 NOTE — Discharge Instructions (Addendum)
Apply ice to sore areas as needed.  Take acetaminophen as needed for pain for less severe pain. You may also take it with naproxen to give better pain rellief.  Use the knee brace and crutches as needed.

## 2019-03-28 ENCOUNTER — Encounter (HOSPITAL_COMMUNITY): Payer: Self-pay

## 2019-04-11 ENCOUNTER — Ambulatory Visit (HOSPITAL_COMMUNITY)
Admission: EM | Admit: 2019-04-11 | Discharge: 2019-04-11 | Disposition: A | Discharge status: Home / Self Care | Payer: Self-pay

## 2019-04-11 ENCOUNTER — Other Ambulatory Visit: Payer: Self-pay

## 2019-04-11 DIAGNOSIS — Z4802 Encounter for removal of sutures: Secondary | ICD-10-CM

## 2019-04-11 MED ORDER — BACITRACIN ZINC 500 UNIT/GM EX OINT
TOPICAL_OINTMENT | CUTANEOUS | Status: AC
  Filled 2019-04-11: qty 28.35

## 2019-04-11 NOTE — ED Triage Notes (Signed)
Pt presented to have 37 staples removed from right leg.

## 2019-06-09 ENCOUNTER — Emergency Department (HOSPITAL_COMMUNITY)
Admission: EM | Admit: 2019-06-09 | Discharge: 2019-06-09 | Disposition: A | Discharge status: Home / Self Care | Payer: Self-pay | Attending: Emergency Medicine | Admitting: Emergency Medicine

## 2019-06-09 DIAGNOSIS — F17210 Nicotine dependence, cigarettes, uncomplicated: Secondary | ICD-10-CM | POA: Insufficient documentation

## 2019-06-09 DIAGNOSIS — H60502 Unspecified acute noninfective otitis externa, left ear: Secondary | ICD-10-CM

## 2019-06-09 DIAGNOSIS — H6092 Unspecified otitis externa, left ear: Secondary | ICD-10-CM | POA: Insufficient documentation

## 2019-06-09 DIAGNOSIS — J45909 Unspecified asthma, uncomplicated: Secondary | ICD-10-CM | POA: Insufficient documentation

## 2019-06-09 MED ORDER — AMOXICILLIN 500 MG PO CAPS: 500.0000 mg | ORAL_CAPSULE | Freq: Three times a day (TID) | ORAL | 0 refills | Status: DC

## 2019-06-09 MED ORDER — CIPROFLOXACIN-DEXAMETHASONE 0.3-0.1 % OT SUSP
4.0000 [drp] | Freq: Once | OTIC | Status: AC
  Administered 2019-06-09: 02:00:00 4 [drp] via OTIC

## 2019-06-09 NOTE — Discharge Instructions (Signed)
Instill 4 drops in left ear 2x per day for 7 days.

## 2019-06-09 NOTE — ED Provider Notes (Signed)
[facility] DEPT Provider Note   CSN: [id number] Arrival date & time: 06/09/19  0141     History No chief complaint on file.   Howard Adams is a 34 y.o. male.  Patient presents to the ED with a chief complaint of left ear pain.  The pain started a few days ago.  It is worsened with palpation.  He denies fevers.  Denies swimming.  Denies diabetes.  No successful treatment prior to arrival.  The history is provided by the patient. No language interpreter was used.       Past Medical History:  Diagnosis Date  . Asthma     Patient Active Problem List   Diagnosis Date Noted  . Major depressive disorder, recurrent severe without psychotic features ([place]) 04/25/2018  . Alcohol dependence with withdrawal, uncomplicated ([place]) 95/63/8756    No past surgical history on file.     No family history on file.  Social History   Tobacco Use  . Smoking status: Current Every Day Smoker    Packs/day: 1.00    Years: 10.00    Pack years: 10.00    Types: Cigarettes  . Smokeless tobacco: Never Used  Substance Use Topics  . Alcohol use: Yes    Alcohol/week: 4.0 standard drinks    Types: 4 Cans of beer per week    Comment: 4-5 cans a week  . Drug use: Yes    Types: Marijuana    Home Medications Prior to Admission medications   Medication Sig Start Date End Date Taking? Authorizing Provider  albuterol (PROVENTIL HFA;VENTOLIN HFA) 108 (90 Base) MCG/ACT inhaler Inhale 1-2 puffs into the lungs every 6 (six) hours as needed for wheezing or shortness of breath. 03/16/17   Kinnie Feil, PA-C  amoxicillin (AMOXIL) 500 MG capsule Take 1 capsule (500 mg total) by mouth 3 (three) times daily. 06/09/19   Montine Circle, PA-C  escitalopram (LEXAPRO) 10 MG tablet Take 1 tablet (10 mg total) by mouth daily. 04/26/18   Pennelope Bracken, MD  hydrOXYzine (ATARAX/VISTARIL) 50 MG tablet Take 1 tablet (50 mg total) by mouth every 6 (six) hours as needed for  anxiety. 04/26/18   Pennelope Bracken, MD  naproxen (NAPROSYN) 500 MG tablet Take 1 tablet (500 mg total) by mouth 2 (two) times daily. 43/3/29   Delora Fuel, MD    Allergies    Patient has no known allergies.  Review of Systems   Review of Systems  All other systems reviewed and are negative.   Physical Exam Updated Vital Signs There were no vitals taken for this visit.  Physical Exam Vitals and nursing note reviewed.  Constitutional:      General: He is not in acute distress.    Appearance: He is well-developed. He is not ill-appearing.  HENT:     Head: Normocephalic and atraumatic.     Ears:     Comments: Edematous left ear canal with mild debris Eyes:     Conjunctiva/sclera: Conjunctivae normal.  Cardiovascular:     Rate and Rhythm: Normal rate.  Pulmonary:     Effort: Pulmonary effort is normal. No respiratory distress.  Abdominal:     General: There is no distension.  Musculoskeletal:     Cervical back: Neck supple.     Comments: Moves all extremities  Skin:    General: Skin is warm and dry.  Neurological:     Mental Status: He is alert and oriented to person, place, and time.  Psychiatric:  Mood and Affect: Mood normal.        Behavior: Behavior normal.     ED Results / Procedures / Treatments   Labs (all labs ordered are listed, but only abnormal results are displayed) Labs Reviewed - No data to display  EKG None  Radiology No results found.  Procedures Procedures (including critical care time)  Medications Ordered in ED Medications  ciprofloxacin-dexamethasone (CIPRODEX) 0.3-0.1 % OTIC (EAR) suspension 4 drop (has no administration in time range)    ED Course  I have reviewed the triage vital signs and the nursing notes.  Pertinent labs & imaging results that were available during my care of the patient were reviewed by me and considered in my medical decision making (see chart for details).    MDM Rules/Calculators/A&P                       Patient with otitis externa. Treat with ciprodex.  Canal is patent and doesn't require an ear wick.     Final Clinical Impression(s) / ED Diagnoses Final diagnoses:  Acute otitis externa of left ear, unspecified type    Rx / DC Orders ED Discharge Orders         Ordered    amoxicillin (AMOXIL) 500 MG capsule  3 times daily     06/09/19 0214           Roxy Horseman, PA-C 06/09/19 0227    Nira Conn, MD 06/09/19 [phone]

## 2019-06-11 ENCOUNTER — Encounter (HOSPITAL_COMMUNITY): Payer: Self-pay | Admitting: Emergency Medicine

## 2019-06-11 ENCOUNTER — Emergency Department (HOSPITAL_COMMUNITY)
Admission: EM | Admit: 2019-06-11 | Discharge: 2019-06-11 | Disposition: A | Discharge status: Home / Self Care | Payer: Self-pay | Attending: Emergency Medicine | Admitting: Emergency Medicine

## 2019-06-11 ENCOUNTER — Other Ambulatory Visit: Payer: Self-pay

## 2019-06-11 DIAGNOSIS — F17210 Nicotine dependence, cigarettes, uncomplicated: Secondary | ICD-10-CM | POA: Insufficient documentation

## 2019-06-11 DIAGNOSIS — H9202 Otalgia, left ear: Secondary | ICD-10-CM | POA: Insufficient documentation

## 2019-06-11 DIAGNOSIS — Z79899 Other long term (current) drug therapy: Secondary | ICD-10-CM | POA: Insufficient documentation

## 2019-06-11 MED ORDER — ACETAMINOPHEN 500 MG PO TABS
1000.0000 mg | ORAL_TABLET | Freq: Once | ORAL | Status: AC
  Administered 2019-06-11: 09:00:00 1000 mg via ORAL
  Filled 2019-06-11: qty 2

## 2019-06-11 MED ORDER — KETOROLAC TROMETHAMINE 30 MG/ML IJ SOLN
60.0000 mg | Freq: Once | INTRAMUSCULAR | Status: AC
  Administered 2019-06-11: 60 mg via INTRAMUSCULAR
  Filled 2019-06-11: qty 2

## 2019-06-11 NOTE — ED Provider Notes (Signed)
[facility] EMERGENCY DEPARTMENT Provider Note   CSN: [id number] Arrival date & time: 06/11/19  0802     History Chief Complaint  Patient presents with  . Otalgia    Howard Adams is a 34 y.o. male presents to the ER for evaluation of continued left ear pain for the last 3 days.  Was seen in the ER couple of days ago and diagnosed with ear infection.  He was given Ciprodex and amoxicillin which she has taken.  He has taken only 1 dose of BC powders without relief.  States this morning he rolled over in bed and his feet are touched his girlfriend's arm and he felt like the pain got significantly worse.  Reports severe, constant, deep throbbing pain in the left ear.  Denies any ear swelling, drainage or bleeding.  Denies any fevers.  Denies any sore throat.  No history of diabetes.  No significant past medical history.  No recent swimming.  No trauma.  No changes in his hearing, tinnitus or dizziness.  HPI     Past Medical History:  Diagnosis Date  . Asthma     Patient Active Problem List   Diagnosis Date Noted  . 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 Alcohol dependence with withdrawal, uncomplicated ([facility]) 04/25/2018    History reviewed. No pertinent surgical history.     No family history on file.  Social History   Tobacco Use  . Smoking status: Current Every Day Smoker    Packs/day: 1.00    Years: 10.00    Pack years: 10.00    Types: Cigarettes  . Smokeless tobacco: Never Used  Substance Use Topics  . Alcohol use: Yes    Alcohol/week: 4.0 standard drinks    Types: 4 Cans of beer per week    Comment: 4-5 cans a week  . Drug use: Yes    Types: Marijuana    Home Medications Prior to Admission medications   Medication Sig Start Date End Date Taking? Authorizing Provider  albuterol (PROVENTIL HFA;VENTOLIN HFA) 108 (90 Base) MCG/ACT inhaler Inhale 1-2 puffs into the lungs every 6 (six) hours as  needed for wheezing or shortness of breath. 03/16/17   Liberty Handy, PA-C  amoxicillin (AMOXIL) 500 MG capsule Take 1 capsule (500 mg total) by mouth 3 (three) times daily. 06/09/19   Roxy Horseman, PA-C  escitalopram (LEXAPRO) 10 MG tablet Take 1 tablet (10 mg total) by mouth daily. 04/26/18   Micheal Likens, MD  hydrOXYzine (ATARAX/VISTARIL) 50 MG tablet Take 1 tablet (50 mg total) by mouth every 6 (six) hours as needed for anxiety. 04/26/18   Micheal Likens, MD  naproxen (NAPROSYN) 500 MG tablet Take 1 tablet (500 mg total) by mouth 2 (two) times daily. 03/27/19   Dione Booze, MD    Allergies    Patient has no known allergies.  Review of Systems   Review of Systems  HENT: Positive for ear pain.   All other systems reviewed and are negative.   Physical Exam Updated Vital Signs BP 121/85 (BP Location: Right Arm)   Pulse 98   Temp 98.1 F (36.7 C) (Oral)   Resp 17   SpO2 99%   Physical Exam Constitutional:      Appearance: He is well-developed.  HENT:     Head: Normocephalic.     Right Ear: Tympanic membrane, ear canal and external ear normal.     Left Ear: Tympanic membrane normal.  Swelling and tenderness present.     Ears:     Comments: Left ear: external ear anatomy including helix, tragus, concha and lobule normal.  Moderate edema in middle ear canal, pain noted with ear movement and examination.  TM still able to be visualized and is normal without erythema, bulging, perforation.  Mastoid and peri-auricular soft tissue normal without tenderness, edema.   Right ear: external ear, ear canal and TM normal.     Nose: Nose normal.     Mouth/Throat:     Comments: Oropharynx, tonsils and soft/hard palate normal. Poor dentition noted. No dental abscess or dental tenderness Eyes:     General: Lids are normal.  Neck:     Comments: No APL neck edema or tenderness. No posterior neck edema or tenderness  Cardiovascular:     Rate and Rhythm: Normal rate.    Pulmonary:     Effort: Pulmonary effort is normal. No respiratory distress.  Musculoskeletal:        General: Normal range of motion.     Cervical back: Normal range of motion.  Neurological:     Mental Status: He is alert.  Psychiatric:        Behavior: Behavior normal.     ED Results / Procedures / Treatments   Labs (all labs ordered are listed, but only abnormal results are displayed) Labs Reviewed - No data to display  EKG None  Radiology No results found.  Procedures Procedures (including critical care time)  Medications Ordered in ED Medications  ketorolac (TORADOL) 30 MG/ML injection 60 mg (has no administration in time range)  acetaminophen (TYLENOL) tablet 1,000 mg (has no administration in time range)    ED Course  I have reviewed the triage vital signs and the nursing notes.  Pertinent labs & imaging results that were available during my care of the patient were reviewed by me and considered in my medical decision making (see chart for details).    MDM Rules/Calculators/A&P                      EMR reviewed, seen recently and diagnosed with AOM.  I agree with this diagnosis. Patient has classic exam findings of this including ear canal edema, exquisite tenderness with exam.  Has only taken 1 BC powder but nothing else for pain.  Reports compliance with amoxicillin and Ciprodex.    I see no physical exam findings of acute otitis externa complication like mastoiditis, abscess.  No skin lesions, vesicular lesions or rash.  His tympanic membrane is benign.  No surrounding A.P.L. or posterior neck edema or tenderness.  No drainage.  No changes to his hearing, tinnitus or dizziness.  Throat exam benign.   I do not think further emergent lab work, imaging is necessary today.  Discussed with patient and girlfriend over the phone importance of using anti-inflammatory medicines to help with pain throughout the day.  We will give Toradol IM and Tylenol  here.  Recommended high doses of NSAIDs for pain control.  Return precautions given. Final Clinical Impression(s) / ED Diagnoses Final diagnoses:  Ear pain, left    Rx / DC Orders ED Discharge Orders    None       Kinnie Feil, PA-C 06/11/19 [id number]    Veryl Speak, MD 06/12/19 0710

## 2019-06-11 NOTE — ED Triage Notes (Addendum)
C/o L ear pain x 3 days.  States he is taking amoxicillin with no improvement.  Seen at [facility] on the 14th.

## 2019-06-11 NOTE — Discharge Instructions (Addendum)
You were seen in the ER for continued ear pain.  Your exam is classic of external ear infection.  Continue taking amoxicillin and Ciprodex eardrops both will help with the infection  You need to take strong and high doses of anti-inflammatories to address the inflammation and pain.  For pain and inflammation you can use a combination of ibuprofen and acetaminophen.  Take [phone] mg acetaminophen (tylenol) every 6 hours or 600 mg ibuprofen (advil, motrin) every 6 hours.  You can take these separately or combine them every 6 hours for maximum pain control. Do not exceed 4,000 mg acetaminophen or 2,400 mg ibuprofen in a 24 hour period.  Do not take ibuprofen containing products if you have history of kidney disease, ulcers, GI bleeding, severe acid reflux, or take a blood thinner or are pregnant.  Do not take acetaminophen if you have liver disease.   I recommend re-evaluation by primary care provider in the next 72 hours to ensure symptoms are improving  Return to ER for fever greater than 100.4 F, severe ear or neck swelling, bleeding, pus, severe headaches despite medicines   Avoid swimming or getting water inside the ear

## 2019-07-12 ENCOUNTER — Encounter (HOSPITAL_COMMUNITY): Payer: Self-pay | Admitting: Nurse Practitioner

## 2019-07-12 ENCOUNTER — Other Ambulatory Visit: Payer: Self-pay

## 2019-07-12 ENCOUNTER — Inpatient Hospital Stay (HOSPITAL_COMMUNITY)
Admission: AD | Admit: 2019-07-12 | Discharge: 2019-07-13 | DRG: 882 | Disposition: A | Discharge status: Home / Self Care | Payer: Federal, State, Local not specified - Other | Attending: Psychiatry | Admitting: Psychiatry

## 2019-07-12 DIAGNOSIS — Z79899 Other long term (current) drug therapy: Secondary | ICD-10-CM | POA: Diagnosis not present

## 2019-07-12 DIAGNOSIS — Z791 Long term (current) use of non-steroidal anti-inflammatories (NSAID): Secondary | ICD-10-CM | POA: Diagnosis not present

## 2019-07-12 DIAGNOSIS — F1020 Alcohol dependence, uncomplicated: Secondary | ICD-10-CM

## 2019-07-12 DIAGNOSIS — R45851 Suicidal ideations: Secondary | ICD-10-CM | POA: Diagnosis present

## 2019-07-12 DIAGNOSIS — F1024 Alcohol dependence with alcohol-induced mood disorder: Secondary | ICD-10-CM | POA: Diagnosis present

## 2019-07-12 DIAGNOSIS — Z20822 Contact with and (suspected) exposure to covid-19: Secondary | ICD-10-CM | POA: Diagnosis present

## 2019-07-12 DIAGNOSIS — G4700 Insomnia, unspecified: Secondary | ICD-10-CM | POA: Diagnosis present

## 2019-07-12 DIAGNOSIS — F17210 Nicotine dependence, cigarettes, uncomplicated: Secondary | ICD-10-CM | POA: Diagnosis present

## 2019-07-12 DIAGNOSIS — F4325 Adjustment disorder with mixed disturbance of emotions and conduct: Principal | ICD-10-CM | POA: Diagnosis present

## 2019-07-12 DIAGNOSIS — F1290 Cannabis use, unspecified, uncomplicated: Secondary | ICD-10-CM | POA: Diagnosis present

## 2019-07-12 DIAGNOSIS — F1490 Cocaine use, unspecified, uncomplicated: Secondary | ICD-10-CM | POA: Diagnosis present

## 2019-07-12 LAB — COMPREHENSIVE METABOLIC PANEL
ALT: 33 U/L (ref 0–44)
AST: 33 U/L (ref 15–41)
Albumin: 3.7 g/dL (ref 3.5–5.0)
Alkaline Phosphatase: 63 U/L (ref 38–126)
Anion gap: 9 (ref 5–15)
BUN: 9 mg/dL (ref 6–20)
CO2: 23 mmol/L (ref 22–32)
Calcium: 9.4 mg/dL (ref 8.9–10.3)
Chloride: 112 mmol/L — ABNORMAL HIGH (ref 98–111)
Creatinine, Ser: 0.85 mg/dL (ref 0.61–1.24)
GFR calc Af Amer: >60 mL/min (ref >60)
GFR calc non Af Amer: >60 mL/min (ref >60)
Glucose, Bld: 86 mg/dL (ref 70–99)
Potassium: 4.3 mmol/L (ref 3.5–5.1)
Sodium: 144 mmol/L (ref 135–145)
Total Bilirubin: 0.5 mg/dL (ref 0.3–1.2)
Total Protein: 7.4 g/dL (ref 6.5–8.1)

## 2019-07-12 LAB — RESPIRATORY PANEL BY RT PCR (FLU A&B, COVID)
Influenza A by PCR: NEGATIVE
Influenza B by PCR: NEGATIVE
SARS Coronavirus 2 by RT PCR: NEGATIVE

## 2019-07-12 LAB — CBC
HCT: 45.7 % (ref 39.0–52.0)
Hemoglobin: 15.1 g/dL (ref 13.0–17.0)
MCH: 31.3 pg (ref 26.0–34.0)
MCHC: 33 g/dL (ref 30.0–36.0)
MCV: 94.6 fL (ref 80.0–100.0)
Platelets: 245 10*3/uL (ref 150–400)
RBC: 4.83 MIL/uL (ref 4.22–5.81)
RDW: 12.3 % (ref 11.5–15.5)
WBC: 4.1 10*3/uL (ref 4.0–10.5)
nRBC: 0 % (ref 0.0–0.2)

## 2019-07-12 MED ORDER — LOPERAMIDE HCL 2 MG PO CAPS: 2.0000 mg | ORAL_CAPSULE | ORAL | Status: DC | PRN

## 2019-07-12 MED ORDER — HYDROXYZINE HCL 25 MG PO TABS: 25.0000 mg | ORAL_TABLET | Freq: Four times a day (QID) | ORAL | Status: DC | PRN

## 2019-07-12 MED ORDER — MAGNESIUM HYDROXIDE 400 MG/5ML PO SUSP: 30.0000 mL | Freq: Every day | ORAL | Status: DC | PRN

## 2019-07-12 MED ORDER — ADULT MULTIVITAMIN W/MINERALS CH
1.0000 | ORAL_TABLET | Freq: Every day | ORAL | Status: DC
  Administered 2019-07-12 – 2019-07-13 (×2): 1 via ORAL
  Filled 2019-07-12 (×3): qty 1

## 2019-07-12 MED ORDER — ACETAMINOPHEN 325 MG PO TABS
650.0000 mg | ORAL_TABLET | Freq: Four times a day (QID) | ORAL | Status: DC | PRN
  Administered 2019-07-12: 08:00:00 650 mg via ORAL
  Filled 2019-07-12: qty 2

## 2019-07-12 MED ORDER — ONDANSETRON 4 MG PO TBDP: 4.0000 mg | ORAL_TABLET | Freq: Four times a day (QID) | ORAL | Status: DC | PRN

## 2019-07-12 MED ORDER — ALUM & MAG HYDROXIDE-SIMETH 200-200-20 MG/5ML PO SUSP: 30.0000 mL | ORAL | Status: DC | PRN

## 2019-07-12 MED ORDER — LORAZEPAM 1 MG PO TABS: 1.0000 mg | ORAL_TABLET | Freq: Four times a day (QID) | ORAL | Status: DC | PRN

## 2019-07-12 MED ORDER — THIAMINE HCL 100 MG PO TABS
100.0000 mg | ORAL_TABLET | Freq: Every day | ORAL | Status: DC
  Administered 2019-07-13: 09:00:00 100 mg via ORAL
  Filled 2019-07-12 (×2): qty 1

## 2019-07-12 NOTE — Progress Notes (Signed)
Pt transferred to room 304-2 from Observation unit.  Pt signed admission paperwork and verbalized understanding of plan of care.  Pt is anxious and wants to leave [facility] because he is worried that he will lose his job from not being able to go to work.  Pt has no questions or concerns at this time.  Q15 min checks initiated on the unit.  Pt oriented to the unit, staff, and room.

## 2019-07-12 NOTE — Discharge Summary (Signed)
  Patient to be transferred to [facility] inpatient for psychiatric treatment 

## 2019-07-12 NOTE — Progress Notes (Signed)
The patient attended the evening A.A./Zoom meeting and was appropriate.
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
## 2019-07-12 NOTE — Plan of Care (Signed)
[facility] Observation Crisis Plan  Reason for Crisis Plan:  Crisis Stabilization   Plan of Care:  Referral for Telepsychiatry/Psychiatric Consult  Family Support:    None present  Current Living Environment:  Living Arrangements: Spouse/significant other, Children  Insurance:   Hospital Account    Name Acct ID Class Status Primary Coverage   Howard Adams, Howard Adams [id number] BEHAVIORAL HEALTH OBSERVATION Open None        Guarantor Account (for Hospital Account [id number])    Name Relation to Pt Service Area Active? Acct Type   Howard Adams Self [facility] Yes Behavioral Health   Address Phone       [address] [place], [place] [address] [phone](H)          Coverage Information (for Hospital Account [id number])    Not on file      Legal Guardian:  Legal Guardian: Other:(Self)  Primary Care Provider:  Patient, No Pcp Per  Current Outpatient Providers:  n/a  Psychiatrist:  Name of Psychiatrist: None  Counselor/Therapist:  Name of Therapist: None  Compliant with Medications:  No  Additional Information: Pt stated he wrote a suicide note, "but I was just playing", he stated. Stated, that his girlfriend has also done this in the past and he "didn't call on her".  Pt denies SI, HI, A/VNonn   Howard Adams 2/16/20213:32 AM
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
## 2019-07-12 NOTE — BH Assessment (Signed)
BHH Assessment Progress Note  Per Malvin Johns, MD, this pt requires psychiatric hospitalization.  Pt has been assigned pt to [facility] Rm [id number].  Pt presents under IVC initiated by law enforcement, and upheld by Dr Jeannine Kitten, and IVC documents have been faxed to [facility].  Pt's nurse has been notified.   Doylene Canning, [place] Behavioral Health Coordinator [phone]

## 2019-07-12 NOTE — Tx Team (Signed)
Initial Treatment Plan 07/12/2019 1:57 PM [name] LTJ:[id number]    PATIENT STRESSORS: Financial difficulties Occupational concerns Substance abuse   PATIENT STRENGTHS: Ability for insight Communication skills Physical Health   PATIENT IDENTIFIED PROBLEMS: Depression  Suicidal Ideation  Substance use/abuse                 DISCHARGE CRITERIA:  Ability to meet basic life and health needs Improved stabilization in mood, thinking, and/or behavior Motivation to continue treatment in a less acute level of care  PRELIMINARY DISCHARGE PLAN: Outpatient therapy  PATIENT/FAMILY INVOLVEMENT: This treatment plan has been presented to and reviewed with the patient, Howard Adams.  The patient has been given the opportunity to ask questions and make suggestions.  Howard Scheuermann, RN 07/12/2019, 1:57 PM

## 2019-07-13 MED ORDER — ESCITALOPRAM OXALATE 10 MG PO TABS
10.0000 mg | ORAL_TABLET | Freq: Every day | ORAL | Status: DC
  Filled 2019-07-13 (×2): qty 7

## 2019-07-13 MED ORDER — HYDROXYZINE HCL 50 MG PO TABS
50.0000 mg | ORAL_TABLET | Freq: Four times a day (QID) | ORAL | Status: DC | PRN
  Filled 2019-07-13: qty 10

## 2019-07-13 NOTE — Tx Team (Signed)
Interdisciplinary Treatment and Diagnostic Plan Update  07/13/2019 Time of Session: 10:00am Howard Adams MRN: [id number]  Principal Diagnosis: Adjustment disorder with mixed disturbance of emotions and conduct  Secondary Diagnoses: Principal Problem:   Adjustment disorder with mixed disturbance of emotions and conduct Active Problems:   Alcohol use disorder, severe, dependence (HCC)   Current Medications:  Current Facility-Administered Medications  Medication Dose Route Frequency Provider Last Rate Last Admin  . acetaminophen (TYLENOL) tablet 650 mg  650 mg Oral Q6H PRN Lindon Romp A, NP   650 mg at 07/12/19 0740  . alum & mag hydroxide-simeth (MAALOX/MYLANTA) 200-200-20 MG/5ML suspension 30 mL  30 mL Oral Q4H PRN Lindon Romp A, NP      . hydrOXYzine (ATARAX/VISTARIL) tablet 25 mg  25 mg Oral Q6H PRN Lindon Romp A, NP      . loperamide (IMODIUM) capsule 2-4 mg  2-4 mg Oral PRN Lindon Romp A, NP      . LORazepam (ATIVAN) tablet 1 mg  1 mg Oral Q6H PRN Lindon Romp A, NP      . magnesium hydroxide (MILK OF MAGNESIA) suspension 30 mL  30 mL Oral Daily PRN Lindon Romp A, NP      . multivitamin with minerals tablet 1 tablet  1 tablet Oral Daily Lindon Romp A, NP   1 tablet at 07/13/19 0904  . ondansetron (ZOFRAN-ODT) disintegrating tablet 4 mg  4 mg Oral Q6H PRN Lindon Romp A, NP      . thiamine tablet 100 mg  100 mg Oral Daily Lindon Romp A, NP   100 mg at 07/13/19 4098   PTA Medications: Medications Prior to Admission  Medication Sig Dispense Refill Last Dose  . albuterol (PROVENTIL HFA;VENTOLIN HFA) 108 (90 Base) MCG/ACT inhaler Inhale 1-2 puffs into the lungs every 6 (six) hours as needed for wheezing or shortness of breath. (Patient not taking: Reported on 07/12/2019) 1 Inhaler 0 Not Taking at Unknown time  . amoxicillin (AMOXIL) 500 MG capsule Take 1 capsule (500 mg total) by mouth 3 (three) times daily. (Patient not taking: Reported on 07/12/2019) 21 capsule 0 Completed Course  at Unknown time  . escitalopram (LEXAPRO) 10 MG tablet Take 1 tablet (10 mg total) by mouth daily. (Patient not taking: Reported on 07/12/2019) 30 tablet 0 Not Taking at Unknown time  . hydrOXYzine (ATARAX/VISTARIL) 50 MG tablet Take 1 tablet (50 mg total) by mouth every 6 (six) hours as needed for anxiety. (Patient not taking: Reported on 07/12/2019) 30 tablet 0 Not Taking at Unknown time  . naproxen (NAPROSYN) 500 MG tablet Take 1 tablet (500 mg total) by mouth 2 (two) times daily. (Patient not taking: Reported on 07/12/2019) 30 tablet 0 Completed Course at Unknown time    Patient Stressors: Financial difficulties Occupational concerns Substance abuse  Patient Strengths: Ability for insight Communication skills Physical Health  Treatment Modalities: Medication Management, Group therapy, Case management,  1 to 1 session with clinician, Psychoeducation, Recreational therapy.   Physician Treatment Plan for Primary Diagnosis: Adjustment disorder with mixed disturbance of emotions and conduct Long Term Goal(s):     Short Term Goals:    Medication Management: Evaluate patient's response, side effects, and tolerance of medication regimen.  Therapeutic Interventions: 1 to 1 sessions, Unit Group sessions and Medication administration.  Evaluation of Outcomes: Progressing  Physician Treatment Plan for Secondary Diagnosis: Principal Problem:   Adjustment disorder with mixed disturbance of emotions and conduct Active Problems:   Alcohol use disorder, severe, dependence ([place])  Long  Term Goal(s):     Short Term Goals:       Medication Management: Evaluate patient's response, side effects, and tolerance of medication regimen.  Therapeutic Interventions: 1 to 1 sessions, Unit Group sessions and Medication administration.  Evaluation of Outcomes: Progressing   RN Treatment Plan for Primary Diagnosis: Adjustment disorder with mixed disturbance of emotions and conduct Long Term Goal(s):  Knowledge of disease and therapeutic regimen to maintain health will improve  Short Term Goals: Ability to verbalize feelings will improve and Ability to identify and develop effective coping behaviors will improve  Medication Management: RN will administer medications as ordered by provider, will assess and evaluate patient's response and provide education to patient for prescribed medication. RN will report any adverse and/or side effects to prescribing provider.  Therapeutic Interventions: 1 on 1 counseling sessions, Psychoeducation, Medication administration, Evaluate responses to treatment, Monitor vital signs and CBGs as ordered, Perform/monitor CIWA, COWS, AIMS and Fall Risk screenings as ordered, Perform wound care treatments as ordered.  Evaluation of Outcomes: Progressing   LCSW Treatment Plan for Primary Diagnosis: Adjustment disorder with mixed disturbance of emotions and conduct Long Term Goal(s): Safe transition to appropriate next level of care at discharge, Engage patient in therapeutic group addressing interpersonal concerns.  Short Term Goals: Engage patient in aftercare planning with referrals and resources, Increase ability to appropriately verbalize feelings, Facilitate patient progression through stages of change regarding substance use diagnoses and concerns, Identify triggers associated with mental health/substance abuse issues and Increase skills for wellness and recovery  Therapeutic Interventions: Assess for all discharge needs, 1 to 1 time with Social worker, Explore available resources and support systems, Assess for adequacy in community support network, Educate family and significant other(s) on suicide prevention, Complete Psychosocial Assessment, Interpersonal group therapy.  Evaluation of Outcomes: Progressing  Progress in Treatment: Attending groups: Yes. Participating in groups: Yes. Taking medication as prescribed: Yes. Toleration medication:  Yes. Family/Significant other contact made: Yes, individual(s) contacted:  girlfriend, Howard Adams Patient understands diagnosis: Yes. Discussing patient identified problems/goals with staff: Yes. Medical problems stabilized or resolved: Yes. Denies suicidal/homicidal ideation: Yes. Issues/concerns per patient self-inventory: Yes.  New problem(s) identified: Yes, Describe:  financial stressors.  New Short Term/Long Term Goal(s): medication management for mood stabilization; elimination of SI thoughts; development of comprehensive mental wellness/sobriety plan.  Patient Goals: "Get more control (over emotions and drinking). Go home."  Discharge Plan or Barriers: Returning home, following up with [facility]  Reason for Continuation of Hospitalization: Anxiety Depression Medication stabilization  Estimated Length of Stay: 1-3 days  Attendees: Patient: Howard Adams 07/13/2019 10:38 AM  Physician: Javier Docker 07/13/2019 10:38 AM  Nursing:  07/13/2019 10:38 AM  RN Care Manager: 07/13/2019 10:38 AM  Social Worker: Enid Cutter, LCSWA  07/13/2019 10:38 AM  Recreational Therapist:  07/13/2019 10:38 AM  Other:  07/13/2019 10:38 AM  Other:  07/13/2019 10:38 AM  Other: 07/13/2019 10:38 AM    Scribe for Treatment Team: Darreld Mclean, LCSWA 07/13/2019 10:38 AM

## 2019-07-13 NOTE — BHH Group Notes (Signed)
07/13/2019 8:45am Type of Group and Topic: Psychoeducational Group: Discharge Planning  Participation Level: Active  Description of Group Discharge planning group reviews patient's anticipated discharge plans and assists patients to anticipate and address any barriers to wellness/recovery in the community. Suicide prevention education is reviewed with patients in group. Therapeutic Goals 1. Patients will state their anticipated discharge plan and mental health aftercare 2. Patients will identify potential barriers to wellness in the community setting 3. Patients will engage in problem solving, solution focused discussion of ways to anticipate and address barriers to wellness/recovery   Summary of Patient Progress Plan for Discharge/Comments: Howard Adams reports he is ready to discharge as soon as possible. He shared with the group he had "a bad night", prior to coming to the hospital. Howard Adams did express interest in being referred to a therapist in order for him to discuss coping skills and past trauma he experienced during his childhood and adolescent years.   Transportation Means: Howard Adams reports he would have his girlfriend pick him up when it was time for him to discharge.    Supports: Girlfriend and other family members   Therapeutic Modalities: Motivational Interviewing     Baldo Daub, MSW, [facility] Clinical Social Worker [facility]  Phone: [phone] 07/13/2019 1:56 PM

## 2019-07-13 NOTE — Progress Notes (Signed)
   07/13/19 0100  Psych Admission Type (Psych Patients Only)  Admission Status Involuntary  Psychosocial Assessment  Patient Complaints Anxiety  Eye Contact Fair  Facial Expression Anxious  Affect Anxious  Speech Logical/coherent  Interaction Assertive  Motor Activity Slow  Appearance/Hygiene Disheveled  Behavior Characteristics Anxious  Mood Suspicious  Aggressive Behavior  Effect No apparent injury  Thought Process  Coherency WDL  Content WDL  Delusions WDL  Perception WDL  Hallucination None reported or observed  Judgment WDL  Confusion WDL  Danger to Self  Current suicidal ideation? Denies  Danger to Others  Danger to Others None reported or observed

## 2019-07-13 NOTE — BHH Suicide Risk Assessment (Signed)
[facility] Discharge Suicide Risk Assessment   Principal Problem: Adjustment disorder with mixed disturbance of emotions and conduct Discharge Diagnoses: Principal Problem:   Adjustment disorder with mixed disturbance of emotions and conduct Active Problems:   Alcohol use disorder, severe, dependence (HCC)   Total Time spent with patient: 45 minutes Musculoskeletal: Strength & Muscle Tone: within normal limits Gait & Station: normal  Psychiatric Specialty Exam: Physical Exam  Review of Systems  Blood pressure (!) 130/91, pulse 98, temperature 98.2 F (36.8 C), temperature source Oral, resp. rate 18, height 5\' 6"  (1.676 m), weight 77.1 kg, SpO2 94 %.Body mass index is 27.44 kg/m.  General Appearance: Casual  Eye Contact:  Good  Speech:  Clear and Coherent  Volume:  Normal  Mood:  Euthymic  Affect:  Full Range  Thought Process:  Coherent and Goal Directed  Orientation:  Full (Time, Place, and Person)  Thought Content:  Logical  Suicidal Thoughts:  No  Homicidal Thoughts:  No  Memory:  Recent;   Fair Remote;   Fair  Judgement:  Intact  Insight:  Fair  Psychomotor Activity:  Normal  Concentration:  Concentration: Fair and Attention Span: Fair  Recall:  of Knowledge:  Fair  Language:  Good  Akathisia:  Negative  Handed:  Right  AIMS (if indicated):     Assets:  Communication Skills Desire for Improvement  ADL's:  Intact  Cognition:  WNL  Sleep:        Mental Status Per Nursing Assessment::   On Admission:  Suicidal ideation indicated by patient  Demographic Factors:  Unemployed  Loss Factors: NA  Historical Factors: Impulsivity  Risk Reduction Factors:   Sense of responsibility to family, Religious beliefs about death and Living with another person, especially a relative  Continued Clinical Symptoms:  Alcohol/Substance Abuse/Dependencies  Cognitive Features That Contribute To Risk:  None    Suicide Risk:  Minimal: No identifiable suicidal  ideation.  Patients presenting with no risk factors but with morbid ruminations; may be classified as minimal risk based on the severity of the depressive symptoms  Follow-up Information    [facility] Follow up on 07/18/2019.   Why: You are scheduled for an appointment on 07/18/19 at 10:00 am.  This will be a virtual tele-health appointment.  Please have your insurance information and your discharge summary available. Contact information: [address] [place] [phone]           Plan Of Care/Follow-up recommendations:  Activity:  full  Adylene Dlugosz, MD 07/13/2019, 3:36 PM

## 2019-07-13 NOTE — [facility] Suicide Risk Assessment (Signed)
[facility] INPATIENT:  Family/Significant Other Suicide Prevention Education  Suicide Prevention Education:  Education Completed; girlfriend, Levada Dy [phone] has been identified by the patient as the family member/significant other with whom the patient will be residing, and identified as the person(s) who will aid the patient in the event of a mental health crisis (suicidal ideations/suicide attempt).  With written consent from the patient, the family member/significant other has been provided the following suicide prevention education, prior to the and/or following the discharge of the patient.  The suicide prevention education provided includes the following:  Suicide risk factors  Suicide prevention and interventions  National Suicide Hotline telephone number  [facility] assessment telephone number  [facility] Emergency Assistance 911  [facility] and/or Residential Mobile Crisis Unit telephone number  Request made of family/significant other to:  Remove weapons (e.g., guns, rifles, knives), all items previously/currently identified as safety concern.    Remove drugs/medications (over-the-counter, prescriptions, illicit drugs), all items previously/currently identified as a safety concern.  The family member/significant other verbalizes understanding of the suicide prevention education information provided.  The family member/significant other agrees to remove the items of safety concern listed above.   Asencion Islam has no safety concerns regarding discharge home and risk of suicide. She is aware that patient has declined residential substance use treatment referrals and has a hospital discharge appointment.   Asencion Islam has no questions, she is agreeable to patient discharging home as soon as today.  Darreld Mclean 07/13/2019, 10:44 AM
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
## 2019-07-13 NOTE — Progress Notes (Signed)
Recreation Therapy Notes  Date:  2.17.21 Time: 0930 Location: 300 Hall Group Room  Group Topic: Stress Management  Goal Area(s) Addresses:  Patient will identify positive stress management techniques. Patient will identify benefits of using stress management post d/c.  Behavioral Response:  Engaged  Intervention: Stress Management  Activity : Guided Imagery.  LRT read a script that took patients on a journey floating on a cloud to anywhere you want to go.  Patients were to follow along as script was read to engage in activity.  Education:  Stress Management, Discharge Planning.   Education Outcome: Acknowledges Education  Clinical Observations/Feedback: Pt attended and participated in activity.    Caroll Rancher, LRT/CTRS         Lillia Abed, Celenia Hruska A 07/13/2019 11:22 AM

## 2019-07-13 NOTE — Progress Notes (Signed)
RN met with pt and reviewed pt's discharge instructions.  Pt verbalized understanding of discharge instructions and pt did not have any questions. RN returned pt's belongings to pt.  Pt denied SI/HI/AVH and voiced no concerns.  Pt was appreciative of the care pt received at [facility].  Patient discharged to the lobby without incident. 
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
## 2019-07-13 NOTE — Progress Notes (Signed)
  [facility] Adult Case Management Discharge Plan :  Will you be returning to the same living situation after discharge:  Yes,  home. At discharge, do you have transportation home?: Yes,  safe transportation. Do you have the ability to pay for your medications: No. Referred to [facility].  Release of information consent forms completed and in the chart.  Patient to Follow up at: Follow-up Information    [facility] Follow up on 07/18/2019.   Why: You are scheduled for an appointment on 07/18/19 at 10:00 am.  This will be a virtual tele-health appointment.  Please have your insurance information and your discharge summary available. Contact information: [address] [place] [address] [phone]           Next level of care provider has access to [facility] Link:no  Safety Planning and Suicide Prevention discussed: Yes,  with girlfriend, Asencion Islam.  Have you used any form of tobacco in the last 30 days? (Cigarettes, Smokeless Tobacco, Cigars, and/or Pipes): Yes  Has patient been referred to the Quitline?: Patient refused referral  Patient has been referred for addiction treatment: Yes  Darreld Mclean, LCSWA 07/13/2019, 3:22 PM

## 2019-07-13 NOTE — [facility] Suicide Risk Assessment (Signed)
[facility] INPATIENT:  Family/Significant Other Suicide Prevention Education  Suicide Prevention Education:  Contact Attempts:  girlfriend, Levada Dy [phone] has been identified by the patient as the family member/significant other with whom the patient will be residing, and identified as the person(s) who will aid the patient in the event of a mental health crisis.  With written consent from the patient, two attempts were made to provide suicide prevention education, prior to and/or following the patient's discharge.  We were unsuccessful in providing suicide prevention education.  A suicide education pamphlet was given to the patient to share with family/significant other.  Date and time of first attempt: 07/13/2019 at 9:43am. CSW left a HIPAA compliant voicemail. Date and time of second attempt: to be attempted  Darreld Mclean 07/13/2019, 9:44 AM

## 2019-07-13 NOTE — Discharge Summary (Signed)
Physician Discharge Summary Note  Patient:  Howard Adams is an 34 y.o., male MRN:  [id number] DOB:  December 14, 1985 Patient phone:  [phone] (home)  Patient address:   [address] [place] [address],  Total Time spent with patient: 45 minutes  Date of Admission:  07/12/2019 Date of Discharge: 07/13/2019  Reason for Admission:    Patient made suicidal threats in the context of an alcohol induced state of agitation/possible blackout states "I remember saying that, but does not what I would do does not who I am"  Principal Problem: Adjustment disorder with mixed disturbance of emotions and conduct Discharge Diagnoses: Principal Problem:   Adjustment disorder with mixed disturbance of emotions and conduct Active Problems:   Alcohol use disorder, severe, dependence (HCC)   Past Psychiatric History: see eval  Past Medical History:  Past Medical History:  Diagnosis Date  . Asthma    History reviewed. No pertinent surgical history. Family History: History reviewed. No pertinent family history. Family Psychiatric  History: see eval Social History:  Social History   Substance and Sexual Activity  Alcohol Use Yes  . Alcohol/week: 4.0 standard drinks  . Types: 4 Cans of beer per week   Comment: 4-5 cans a week     Social History   Substance and Sexual Activity  Drug Use Yes  . Types: Marijuana    Social History   Socioeconomic History  . Marital status: Single    Spouse name: Not on file  . Number of children: Not on file  . Years of education: Not on file  . Highest education level: Not on file  Occupational History  . Not on file  Tobacco Use  . Smoking status: Current Every Day Smoker    Packs/day: 0.50    Years: 10.00    Pack years: 5.00    Types: Cigarettes  . Smokeless tobacco: Never Used  Substance and Sexual Activity  . Alcohol use: Yes    Alcohol/week: 4.0 standard drinks    Types: 4 Cans of beer per week    Comment: 4-5 cans a week  . Drug  use: Yes    Types: Marijuana  . Sexual activity: Yes    Birth control/protection: Condom  Other Topics Concern  . Not on file  Social History Narrative   ** Merged History Encounter **       Social Determinants of Health   Financial Resource Strain:   . Difficulty of Paying Living Expenses: Not on file  Food Insecurity:   . Worried About Programme researcher, broadcasting/film/video in the Last Year: Not on file  . Ran Out of Food in the Last Year: Not on file  Transportation Needs:   . Lack of Transportation (Medical): Not on file  . Lack of Transportation (Non-Medical): Not on file  Physical Activity:   . Days of Exercise per Week: Not on file  . Minutes of Exercise per Session: Not on file  Stress:   . Feeling of Stress : Not on file  Social Connections:   . Frequency of Communication with Friends and Family: Not on file  . Frequency of Social Gatherings with Friends and Family: Not on file  . Attends Religious Services: Not on file  . Active Member of Clubs or Organizations: Not on file  . Attends Banker Meetings: Not on file  . Marital Status: Not on file    Hospital Course:    Patient was admitted under routine precautions did not experience withdrawal  symptoms acknowledge that he had "mild off" but denied suicidal thoughts plans or intent acknowledge that he was under the influence of alcohol when he made reckless statements he displayed no danger behaviors he was requesting discharge he had no need for detox and no acute dangerousness is discharged home with recommendations for meeting/rehab  Physical Findings: AIMS: Facial and Oral Movements Muscles of Facial Expression: None, normal Lips and Perioral Area: None, normal Jaw: None, normal Tongue: None, normal,Extremity Movements Upper (arms, wrists, hands, fingers): None, normal Lower (legs, knees, ankles, toes): None, normal, Trunk Movements Neck, shoulders, hips: None, normal, Overall Severity Severity of abnormal  movements (highest score from questions above): None, normal Incapacitation due to abnormal movements: None, normal Patient's awareness of abnormal movements (rate only patient's report): No Awareness, Dental Status Current problems with teeth and/or dentures?: No Does patient usually wear dentures?: No  CIWA:  CIWA-Ar Total: 0 COWS:  COWS Total Score: 2  Musculoskeletal: Strength & Muscle Tone: within normal limits Gait & Station: normal  Psychiatric Specialty Exam: Physical Exam  Review of Systems  Blood pressure (!) 130/91, pulse 98, temperature 98.2 F (36.8 C), temperature source Oral, resp. rate 18, height 5\' 6"  (1.676 m), weight 77.1 kg, SpO2 94 %.Body mass index is 27.44 kg/m.  General Appearance: Casual  Eye Contact:  Good  Speech:  Clear and Coherent  Volume:  Normal  Mood:  Euthymic  Affect:  Full Range  Thought Process:  Coherent and Goal Directed  Orientation:  Full (Time, Place, and Person)  Thought Content:  Logical  Suicidal Thoughts:  No  Homicidal Thoughts:  No  Memory:  Recent;   Fair Remote;   Fair  Judgement:  Intact  Insight:  Fair  Psychomotor Activity:  Normal  Concentration:  Concentration: Fair and Attention Span: Fair  Recall:  of Knowledge:  Fair  Language:  Good  Akathisia:  Negative  Handed:  Right  AIMS (if indicated):     Assets:  Communication Skills Desire for Improvement  ADL's:  Intact  Cognition:  WNL  Sleep:        Have you used any form of tobacco in the last 30 days? (Cigarettes, Smokeless Tobacco, Cigars, and/or Pipes): Yes  Has this patient used any form of tobacco in the last 30 days? (Cigarettes, Smokeless Tobacco, Cigars, and/or Pipes) Yes, No  Blood Alcohol level:  Lab Results  Component Value Date   ETH 305 (HH) 03/27/2019   ETH 339 (HH) 02/19/2019    Metabolic Disorder Labs:  No results found for: HGBA1C, MPG No results found for: PROLACTIN No results found for: CHOL, TRIG, HDL, CHOLHDL, VLDL,  LDLCALC  See Psychiatric Specialty Exam and Suicide Risk Assessment completed by Attending Physician prior to discharge.  Discharge destination:  Home  Is patient on multiple antipsychotic therapies at discharge:  No   Has Patient had three or more failed trials of antipsychotic monotherapy by history:  No  Recommended Plan for Multiple Antipsychotic Therapies: NA   Allergies as of 07/13/2019   No Known Allergies     Medication List    STOP taking these medications   amoxicillin 500 MG capsule Commonly known as: AMOXIL   naproxen 500 MG tablet Commonly known as: NAPROSYN     TAKE these medications     Indication  albuterol 108 (90 Base) MCG/ACT inhaler Commonly known as: VENTOLIN HFA Inhale 1-2 puffs into the lungs every 6 (six) hours as needed for wheezing or shortness of breath.  Indication: Asthma   escitalopram 10 MG tablet Commonly known as: LEXAPRO Take 1 tablet (10 mg total) by mouth daily.  Indication: Major Depressive Disorder   hydrOXYzine 50 MG tablet Commonly known as: ATARAX/VISTARIL Take 1 tablet (50 mg total) by mouth every 6 (six) hours as needed for anxiety.  Indication: Feeling Anxious      Follow-up Information    [facility] Follow up on 07/18/2019.   Why: You are scheduled for an appointment on 07/18/19 at 10:00 am.  This will be a virtual tele-health appointment.  Please have your insurance information and your discharge summary available. Contact information: [address] [place] [address] [phone]           SignedJohnn Hai, MD 07/13/2019, 3:21 PM

## 2019-07-13 NOTE — [facility] Counselor (Signed)
Adult Comprehensive Assessment  Patient ID: Howard Adams, male   DOB: [birth year], 34 y.o.   MRN: [id number]   Information Source: Information source: Patient  Current Stressors:  Patient states their primary concerns and needs for treatment are:: Patient reports he had an argument with his girlfriend, followed by a bad day at work. Patient drank alcohol and reports he expressed situational SI while intoxicated. Patient denies SI but endorses feelings of not living up to his potential. Patient states their goals for this hospitilization and ongoing recovery are:: "Have more control," acknowledges he drinks too much. 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 Specifically, his cousin and his grandmother.  Financial / Lack of resources (include bankruptcy): limited income, no insurance. Primary breadwinner for the family. Physical health (include injuries & life threatening diseases): none identified Social relationships: Over all good relationship with GF Substance abuse: Endorses drinking alcohol too much, drinks daily, prefers beer, amounts vary but sometimes a 12 pack a day.  Loss: Denies  Living/Environment/Situation:  Living Arrangements: Spouse/significant other, Children Living conditions (as described by patient or guardian): Comfortable Who else lives in the home?: Girlfriend, their 9 month old baby, and girlfriend's four kids. How long has patient lived in current situation?: About 2 years What is atmosphere in current home: Loving, Comfortable, Supportive  Family History:  Marital status: Long term relationship Long term relationship, how long?: Over 2 years  What types of issues is patient dealing with in the relationship?: "I am not able to financially provide for her like I would  like." Additional relationship information: "my ex won't let me see me 59mo old son because she doesn't like my girlfriend." Are you sexually active?: Yes What is your sexual orientation?: heterosexual Has your sexual activity been affected by drugs, alcohol, medication, or emotional stress?: no Does patient have children?: Yes How many children?: 2 How is patient's relationship with their children?: Has a 68 year old daughter with his girlfriend. Has a 55 year old son, "I'm unable to see him because my ex doesn't like my new girlfriend." " I live with my girlfriend's four kids."  Childhood History:  By whom was/is the patient raised?: Both parents Additional childhood history information: "[place] were [place] Description of patient's relationship with caregiver when they were a child: close to both parents Patient's description of current relationship with people who raised him/her: strained with parents How were you disciplined when you got in trouble as a child/adolescent?: grounded; whoopings Did patient suffer any verbal/emotional/physical/sexual abuse as a child?: No Did patient suffer from severe childhood neglect?: No Has patient ever been sexually abused/assaulted/raped as an adolescent or adult?: No Was the patient ever a victim of a crime or a disaster?: No Witnessed domestic violence?: No Has patient been effected by domestic violence as an adult?: No  Education:  Highest grade of school patient has completed: GED Currently a Ship broker?: No Learning disability?: No  Employment/Work Situation:   Employment situation: Employed 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." How long has patient been employed?: on and off for a few years Patient's job has been impacted by current illness: No What is the longest time patient has a held a job?: see above Where was the patient employed  at  that time?: see above Did You Receive Any Psychiatric Treatment/Services While in the Military?: No Are There Guns or Other Weapons in Your Home?: No Are These Weapons Safely Secured?: (n/a)  Financial Resources:   Financial resources: Income from employment, Income from spouse Does patient have a representative payee or guardian?: No  Alcohol/Substance Abuse:   What has been your use of drugs/alcohol within the last 12 months?: Daily alcohol use. If attempted suicide, did drugs/alcohol play a role in this?: Did not attempt suicide, but voiced SI while drunk Alcohol/Substance Abuse Treatment Hx: Denies past history Has alcohol/substance abuse ever caused legal problems?: Yes(breaking and entering; carying concealed weapon. Spent time in prison due to felony convictions)  Social Support System:   Patient's Community Support System: Fair Museum/gallery exhibitions officer System: some friends and supportive family Type of faith/religion: none How does patient's faith help to cope with current illness?: n/a  Leisure/Recreation:   Leisure and Hobbies: "I just go to work and come home. I don't do much for hobbies."   Strengths/Needs:   What is the patient's perception of their strengths?: "I really want to do well in my life and get a good job." Patient states they can use these personal strengths during their treatment to contribute to their recovery: "I want to get out of here and work with my TASK counselor." Patient states these barriers may affect/interfere with their treatment: none identified Patient states these barriers may affect their return to the community: none identified Other important information patient would like considered in planning for their treatment: none identified  Discharge Plan:   Currently receiving community mental health services: No Patient states concerns and preferences for aftercare planning are: Agreeable to follow up with [facility]. Prefers face to  face appointments but is okay with virtual appointments. Declines residential treatment referrals.  Does patient have access to transportation?: Yes(bus and walk) Does patient have financial barriers related to discharge medications?: Yes Patient description of barriers related to discharge medications: limited income and no insurance Will patient be returning to same living situation after discharge?: Yes(home with girlfriend)  Summary/Recommendations:   Emergency planning/management officer and Recommendations (to be completed by the evaluator): Howard Adams is a 34 year old male from [facility] [place]), he presents to [facility] under IVC for expressing SI to family. Patient states he had an argument with his girlfriend, drank alcohol, and expressed SI out of frustration. Patient was last inpatient in December 2019 with a similar presentation. While here, Howard Adams can benefit from crisis stabilization, medication management, therapeutic milieu, and referrals for services.  Howard Adams. 07/13/2019

## 2019-11-03 ENCOUNTER — Emergency Department (HOSPITAL_COMMUNITY)
Admission: EM | Admit: 2019-11-03 | Discharge: 2019-11-03 | Disposition: A | Discharge status: Home / Self Care | Payer: Self-pay | Attending: Emergency Medicine | Admitting: Emergency Medicine

## 2019-11-03 ENCOUNTER — Emergency Department (HOSPITAL_COMMUNITY): Payer: Self-pay

## 2019-11-03 ENCOUNTER — Other Ambulatory Visit: Payer: Self-pay

## 2019-11-03 ENCOUNTER — Encounter (HOSPITAL_COMMUNITY): Payer: Self-pay

## 2019-11-03 DIAGNOSIS — Y939 Activity, unspecified: Secondary | ICD-10-CM | POA: Insufficient documentation

## 2019-11-03 DIAGNOSIS — W2209XA Striking against other stationary object, initial encounter: Secondary | ICD-10-CM | POA: Insufficient documentation

## 2019-11-03 DIAGNOSIS — F1210 Cannabis abuse, uncomplicated: Secondary | ICD-10-CM | POA: Insufficient documentation

## 2019-11-03 DIAGNOSIS — Z79899 Other long term (current) drug therapy: Secondary | ICD-10-CM | POA: Insufficient documentation

## 2019-11-03 DIAGNOSIS — S52501A Unspecified fracture of the lower end of right radius, initial encounter for closed fracture: Secondary | ICD-10-CM | POA: Insufficient documentation

## 2019-11-03 DIAGNOSIS — Y929 Unspecified place or not applicable: Secondary | ICD-10-CM | POA: Insufficient documentation

## 2019-11-03 DIAGNOSIS — F17210 Nicotine dependence, cigarettes, uncomplicated: Secondary | ICD-10-CM | POA: Insufficient documentation

## 2019-11-03 DIAGNOSIS — Y999 Unspecified external cause status: Secondary | ICD-10-CM | POA: Insufficient documentation

## 2019-11-03 MED ORDER — HYDROCODONE-ACETAMINOPHEN 5-325 MG PO TABS
2.0000 | ORAL_TABLET | Freq: Once | ORAL | Status: AC
  Administered 2019-11-03: 2 via ORAL
  Filled 2019-11-03: qty 2

## 2019-11-03 MED ORDER — HYDROCODONE-ACETAMINOPHEN 5-325 MG PO TABS: 1.0000 | ORAL_TABLET | Freq: Four times a day (QID) | ORAL | 0 refills | Status: DC | PRN

## 2019-11-03 NOTE — ED Provider Notes (Signed)
[facility] EMERGENCY DEPARTMENT Provider Note   CSN: [id number] Arrival date & time: 11/03/19  1940     History Chief Complaint  Patient presents with  . Wrist Pain    Saint Hank is a 34 y.o. male.  Patient indicates this evening punched telephone pole with right hand. C/o acute onset right wrist pain, moderate-sev, constant, worse w palpation and movement. Skin intact. Denies numbness/weakness.  Denies other pain or injury. Right hand dom. Denies prior injury/fx.   The history is provided by the patient.  Wrist Pain       Past Medical History:  Diagnosis Date  . Asthma     Patient Active Problem List   Diagnosis Date Noted  . 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 Alcohol use disorder, severe, dependence (HCC) 04/25/2018    History reviewed. No pertinent surgical history.     History reviewed. No pertinent family history.  Social History   Tobacco Use  . Smoking status: Current Every Day Smoker    Packs/day: 0.50    Years: 10.00    Pack years: 5.00    Types: Cigarettes  . Smokeless tobacco: Never Used  Vaping Use  . Vaping Use: Unknown  Substance Use Topics  . Alcohol use: Yes    Alcohol/week: 4.0 standard drinks    Types: 4 Cans of beer per week    Comment: 4-5 cans a week  . Drug use: Yes    Types: Marijuana    Home Medications Prior to Admission medications   Medication Sig Start Date End Date Taking? Authorizing Provider  albuterol (PROVENTIL HFA;VENTOLIN HFA) 108 (90 Base) MCG/ACT inhaler Inhale 1-2 puffs into the lungs every 6 (six) hours as needed for wheezing or shortness of breath. Patient not taking: Reported on 07/12/2019 03/16/17   Liberty Handy, PA-C  escitalopram (LEXAPRO) 10 MG tablet Take 1 tablet (10 mg total) by mouth daily. Patient not taking: Reported on 07/12/2019 04/26/18    Micheal Likens, MD  hydrOXYzine (ATARAX/VISTARIL) 50 MG tablet Take 1 tablet (50 mg total) by mouth every 6 (six) hours as needed for anxiety. Patient not taking: Reported on 07/12/2019 04/26/18   Micheal Likens, MD    Allergies    Patient has no known allergies.  Review of Systems   Review of Systems  Constitutional: Negative for fever.  HENT: Negative for nosebleeds.   Musculoskeletal:       Right wrist injury/pain  Skin: Negative for rash.  Neurological: Negative for numbness.    Physical Exam Updated Vital Signs BP (!) 137/117 (BP Location: Right Arm)   Pulse (!) 123   Temp 98.4 F (36.9 C) (Oral)   Resp 20   Ht 1.676 m (5\' 6" )   Wt 77 kg   SpO2 98%   BMI 27.40 kg/m   Physical Exam Vitals and nursing note reviewed.  Constitutional:      Appearance: Normal appearance. He is well-developed.  HENT:     Head: Atraumatic.     Nose: Nose normal.     Mouth/Throat:     Mouth: Mucous membranes are moist.  Eyes:     Conjunctiva/sclera: Conjunctivae normal.  Neck:     Trachea: No tracheal deviation.  Cardiovascular:     Rate and Rhythm: Normal rate.     Pulses: Normal pulses.  Pulmonary:     Effort: Pulmonary effort is normal.  No accessory muscle usage or respiratory distress.  Genitourinary:    Comments: No cva tenderness. Musculoskeletal:     Cervical back: Normal range of motion.     Comments: Mild sts and tenderness right wrist/distal radius. No focal scaphoid tenderness. Skin is intact. Compartments of forearm are soft, not tense. Radial pulse 2+. Normal cap refill in digits.   Skin:    General: Skin is warm and dry.     Findings: No rash.  Neurological:     Mental Status: He is alert.     Comments: Alert, speech clear. Right hand, rad/med/uln fxn, motor and sens intact.   Psychiatric:        Mood and Affect: Mood normal.     ED Results / Procedures / Treatments   Labs (all labs ordered are listed, but only abnormal results are  displayed) Labs Reviewed - No data to display  EKG None  Radiology DG Wrist Complete Right  Result Date: 11/03/2019 CLINICAL DATA:  Pain after punching a telephone pole, first carpometacarpal pain, limited range of motion EXAM: RIGHT HAND - COMPLETE 3+ VIEW; RIGHT WRIST - COMPLETE 3+ VIEW COMPARISON:  11/24/2009 FINDINGS: Right hand: Frontal, oblique, and lateral views are obtained. There is a prior healed fifth metacarpal fracture. No acute fracture, subluxation, or dislocation. Joint spaces are well preserved. Soft tissues are normal. Right wrist: Frontal, oblique, lateral, and ulnar deviated views of the right wrist are obtained. There is a mildly comminuted and impacted intra-articular distal right radial fracture, with near anatomic alignment. Dorsal soft tissue swelling is noted. IMPRESSION: 1. Minimally displaced intra-articular distal right radial fracture, with dorsal soft tissue swelling at the wrist. 2. Unremarkable right hand. Electronically Signed   By: Sharlet Salina M.D.   On: 11/03/2019 20:30   DG Hand Complete Right  Result Date: 11/03/2019 CLINICAL DATA:  Pain after punching a telephone pole, first carpometacarpal pain, limited range of motion EXAM: RIGHT HAND - COMPLETE 3+ VIEW; RIGHT WRIST - COMPLETE 3+ VIEW COMPARISON:  11/24/2009 FINDINGS: Right hand: Frontal, oblique, and lateral views are obtained. There is a prior healed fifth metacarpal fracture. No acute fracture, subluxation, or dislocation. Joint spaces are well preserved. Soft tissues are normal. Right wrist: Frontal, oblique, lateral, and ulnar deviated views of the right wrist are obtained. There is a mildly comminuted and impacted intra-articular distal right radial fracture, with near anatomic alignment. Dorsal soft tissue swelling is noted. IMPRESSION: 1. Minimally displaced intra-articular distal right radial fracture, with dorsal soft tissue swelling at the wrist. 2. Unremarkable right hand. Electronically Signed    By: Sharlet Salina M.D.   On: 11/03/2019 20:30    Procedures Procedures (including critical care time)  Medications Ordered in ED Medications  HYDROcodone-acetaminophen (NORCO/VICODIN) 5-325 MG per tablet 2 tablet (has no administration in time range)    ED Course  I have reviewed the triage vital signs and the nursing notes.  Pertinent labs & imaging results that were available during my care of the patient were reviewed by me and considered in my medical decision making (see chart for details).    MDM Rules/Calculators/A&P                          No meds pta, confirmed nkda w pt. Imaging ordered.   Hydrocodone po.   Reviewed nursing notes and prior charts for additional history.   icepack to sore area. Sugartong splint to right wrist/forearm.   xrays reviewed/interpreted by me -  distal radius fx, right.    Final Clinical Impression(s) / ED Diagnoses Final diagnoses:  None    Rx / DC Orders ED Discharge Orders    None       Lajean Saver, MD 11/03/19 [id number]

## 2019-11-03 NOTE — Discharge Instructions (Addendum)
It was our pleasure to provide your ER care today - we hope that you feel better.  Keep splint clean/dry. Icepack to sore area.   Take motrin or aleve as need for pain. You may also take hydrocodone as need for pain. No driving for the next 6 hours or when taking hydrocodone. Also, do not take tylenol or acetaminophen containing medication when taking hydrocodone.  Follow up with orthopedic hand/wrist specialist in the coming week - call office tomorrow AM to arrange appointment.   Your blood pressure is high tonight - follow up with primary care doctor in the next 1-2 weeks.   Return to ER if worse, new symptoms, intractable pain, numbness/weakness, or other concern.

## 2019-11-03 NOTE — Progress Notes (Signed)
Orthopedic Tech Progress Note Patient Details:  Shlok Raz 1986/05/21 [id number]  Ortho Devices Type of Ortho Device: Arm sling, Sugartong splint Ortho Device/Splint Location: RUE Ortho Device/Splint Interventions: Application   Post Interventions Patient Tolerated: Well Instructions Provided: Adjustment of device, Care of device   Ijeoma Loor E Tamarius Rosenfield 11/03/2019, 11:15 PM
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
## 2020-06-15 ENCOUNTER — Other Ambulatory Visit: Payer: Self-pay

## 2020-06-15 DIAGNOSIS — Z20822 Contact with and (suspected) exposure to covid-19: Secondary | ICD-10-CM

## 2020-06-16 LAB — NOVEL CORONAVIRUS, NAA: SARS-CoV-2, NAA: NOT DETECTED

## 2020-06-16 LAB — SARS-COV-2, NAA 2 DAY TAT

## 2020-07-04 ENCOUNTER — Emergency Department (HOSPITAL_BASED_OUTPATIENT_CLINIC_OR_DEPARTMENT_OTHER)
Admission: EM | Admit: 2020-07-04 | Discharge: 2020-07-04 | Disposition: A | Discharge status: Home / Self Care | Payer: Self-pay | Attending: Emergency Medicine | Admitting: Emergency Medicine

## 2020-07-04 ENCOUNTER — Encounter (HOSPITAL_BASED_OUTPATIENT_CLINIC_OR_DEPARTMENT_OTHER): Payer: Self-pay | Admitting: *Deleted

## 2020-07-04 ENCOUNTER — Other Ambulatory Visit: Payer: Self-pay

## 2020-07-04 DIAGNOSIS — M546 Pain in thoracic spine: Secondary | ICD-10-CM | POA: Insufficient documentation

## 2020-07-04 DIAGNOSIS — J45909 Unspecified asthma, uncomplicated: Secondary | ICD-10-CM | POA: Insufficient documentation

## 2020-07-04 DIAGNOSIS — Y92410 Unspecified street and highway as the place of occurrence of the external cause: Secondary | ICD-10-CM | POA: Insufficient documentation

## 2020-07-04 DIAGNOSIS — M542 Cervicalgia: Secondary | ICD-10-CM | POA: Insufficient documentation

## 2020-07-04 DIAGNOSIS — F17210 Nicotine dependence, cigarettes, uncomplicated: Secondary | ICD-10-CM | POA: Insufficient documentation

## 2020-07-04 DIAGNOSIS — Z79899 Other long term (current) drug therapy: Secondary | ICD-10-CM | POA: Insufficient documentation

## 2020-07-04 MED ORDER — CYCLOBENZAPRINE HCL 10 MG PO TABS: 10.0000 mg | ORAL_TABLET | Freq: Every evening | ORAL | 0 refills | Status: DC | PRN
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
## 2020-07-04 NOTE — Discharge Instructions (Addendum)
Please take Ibuprofen (Advil, motrin) and Tylenol (acetaminophen) to relieve your pain.   ° °You may take up to 600 MG (3 pills) of normal strength ibuprofen every 8 hours as needed.   °You make take tylenol, up to 1,000 mg (two extra strength pills) every 8 hours as needed.  ° °It is safe to take ibuprofen and tylenol at the same time as they work differently.  ° Do not take more than 3,000 mg tylenol in a 24 hour period (not more than one dose every 8 hours.  Please check all medication labels as many medications such as pain and cold medications may contain tylenol.  Do not drink alcohol while taking these medications.  Do not take other NSAID'S while taking ibuprofen (such as aleve or naproxen).  Please take ibuprofen with food to decrease stomach upset. ° °You are being prescribed a medication which may make you sleepy. For 24 hours after one dose please do not drive, operate heavy machinery, care for a small child with out another adult present, or perform any activities that may cause harm to you or someone else if you were to fall asleep or be impaired.  ° °The best way to get rid of muscle pain is by taking NSAIDS, using heat, massage therapy, and gentle stretching/range of motion exercises. ° °While in the ED your blood pressure was high.  Please follow up with your primary care doctor or the wellness clinic for repeat evaluation as you may need medication.  High blood pressure can cause long term, potentially serious, damage if left untreated.  ° °
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
## 2020-08-12 ENCOUNTER — Other Ambulatory Visit: Payer: Self-pay

## 2020-08-12 ENCOUNTER — Emergency Department (HOSPITAL_COMMUNITY): Payer: Self-pay

## 2020-08-12 ENCOUNTER — Encounter (HOSPITAL_COMMUNITY): Payer: Self-pay

## 2020-08-12 ENCOUNTER — Emergency Department (HOSPITAL_COMMUNITY)
Admission: EM | Admit: 2020-08-12 | Discharge: 2020-08-12 | Disposition: A | Discharge status: Home / Self Care | Payer: Self-pay | Attending: Emergency Medicine | Admitting: Emergency Medicine

## 2020-08-12 DIAGNOSIS — F1290 Cannabis use, unspecified, uncomplicated: Secondary | ICD-10-CM

## 2020-08-12 DIAGNOSIS — F17210 Nicotine dependence, cigarettes, uncomplicated: Secondary | ICD-10-CM | POA: Insufficient documentation

## 2020-08-12 DIAGNOSIS — J45909 Unspecified asthma, uncomplicated: Secondary | ICD-10-CM | POA: Insufficient documentation

## 2020-08-12 DIAGNOSIS — F12188 Cannabis abuse with other cannabis-induced disorder: Secondary | ICD-10-CM | POA: Insufficient documentation

## 2020-08-12 LAB — CBC
HCT: 38.9 % — ABNORMAL LOW (ref 39.0–52.0)
Hemoglobin: 13.6 g/dL (ref 13.0–17.0)
MCH: 32.6 pg (ref 26.0–34.0)
MCHC: 35 g/dL (ref 30.0–36.0)
MCV: 93.3 fL (ref 80.0–100.0)
Platelets: 208 10*3/uL (ref 150–400)
RBC: 4.17 MIL/uL — ABNORMAL LOW (ref 4.22–5.81)
RDW: 11.9 % (ref 11.5–15.5)
WBC: 3.7 10*3/uL — ABNORMAL LOW (ref 4.0–10.5)
nRBC: 0 % (ref 0.0–0.2)

## 2020-08-12 LAB — URINALYSIS, ROUTINE W REFLEX MICROSCOPIC
Bilirubin Urine: NEGATIVE
Glucose, UA: NEGATIVE mg/dL
Hgb urine dipstick: NEGATIVE
Ketones, ur: NEGATIVE mg/dL
Leukocytes,Ua: NEGATIVE
Nitrite: NEGATIVE
Protein, ur: NEGATIVE mg/dL
Specific Gravity, Urine: 1.024 (ref 1.005–1.030)
pH: 6 (ref 5.0–8.0)

## 2020-08-12 LAB — COMPREHENSIVE METABOLIC PANEL
ALT: 50 U/L — ABNORMAL HIGH (ref 0–44)
AST: 49 U/L — ABNORMAL HIGH (ref 15–41)
Albumin: 3.5 g/dL (ref 3.5–5.0)
Alkaline Phosphatase: 65 U/L (ref 38–126)
Anion gap: 9 (ref 5–15)
BUN: 7 mg/dL (ref 6–20)
CO2: 21 mmol/L — ABNORMAL LOW (ref 22–32)
Calcium: 9.2 mg/dL (ref 8.9–10.3)
Chloride: 105 mmol/L (ref 98–111)
Creatinine, Ser: 0.97 mg/dL (ref 0.61–1.24)
GFR, Estimated: >60 mL/min (ref >60)
Glucose, Bld: 106 mg/dL — ABNORMAL HIGH (ref 70–99)
Potassium: 3.7 mmol/L (ref 3.5–5.1)
Sodium: 135 mmol/L (ref 135–145)
Total Bilirubin: 0.8 mg/dL (ref 0.3–1.2)
Total Protein: 6.7 g/dL (ref 6.5–8.1)

## 2020-08-12 LAB — LIPASE, BLOOD: Lipase: 33 U/L (ref 11–51)

## 2020-08-12 MED ORDER — SODIUM CHLORIDE 0.9 % IV BOLUS
1000.0000 mL | Freq: Once | INTRAVENOUS | Status: AC
  Administered 2020-08-12: 1000 mL via INTRAVENOUS

## 2020-08-12 MED ORDER — IOHEXOL 300 MG/ML  SOLN
100.0000 mL | Freq: Once | INTRAMUSCULAR | Status: AC | PRN
  Administered 2020-08-12: 100 mL via INTRAVENOUS

## 2020-08-12 MED ORDER — LIDOCAINE VISCOUS HCL 2 % MT SOLN
15.0000 mL | Freq: Once | OROMUCOSAL | Status: AC
  Administered 2020-08-12: 15 mL via ORAL
  Filled 2020-08-12: qty 15

## 2020-08-12 MED ORDER — ALUM & MAG HYDROXIDE-SIMETH 200-200-20 MG/5ML PO SUSP
30.0000 mL | Freq: Once | ORAL | Status: AC
  Administered 2020-08-12: 30 mL via ORAL
  Filled 2020-08-12: qty 30

## 2020-08-12 MED ORDER — ONDANSETRON 4 MG PO TBDP: 4.0000 mg | ORAL_TABLET | Freq: Three times a day (TID) | ORAL | 0 refills | Status: DC | PRN

## 2020-08-12 MED ORDER — FENTANYL CITRATE (PF) 100 MCG/2ML IJ SOLN
50.0000 ug | Freq: Once | INTRAMUSCULAR | Status: AC
  Administered 2020-08-12: 50 ug via INTRAVENOUS
  Filled 2020-08-12: qty 2

## 2020-08-12 MED ORDER — OXYCODONE-ACETAMINOPHEN 5-325 MG PO TABS
1.0000 | ORAL_TABLET | ORAL | Status: DC | PRN
  Administered 2020-08-12: 1 via ORAL
  Filled 2020-08-12: qty 1

## 2020-08-12 NOTE — ED Notes (Signed)
ED Provider  Rebekah, at bedside.

## 2020-08-12 NOTE — ED Provider Notes (Signed)
[facility] EMERGENCY DEPARTMENT Provider Note   CSN: [id number] Arrival date & time: 08/12/20  0408     History Chief Complaint  Patient presents with  . Abdominal Pain    Howard Adams is a 35 y.o. male who endorses severe right upper quadrant and epigastric pain is worse at night last 48 hours with associated nausea and vomiting as well as extensive watery diarrhea over the last 48 hours.  He states the pain resolves in the daytime, however nausea, vomiting, and diarrhea do not resolve.  He endorses greater than 10 episodes of NBNB emesis in the last 48 hours as well as approximately 12 episodes of watery diarrhea.  Endorses tolerating PO, states his right upper quadrant pain is worse after eating. He denies any chest pain or shortness of breath, endorses chills at home but denies fevers.  Endorses extensive alcohol use, up to > 12 beers at night. Additionally, endorses use of marijuana, most recently within the last 72 hours, as well as extensive history of cannabis use. Of note, his symptoms are cyclical occurring at night, and are completely resolved by a taking a steaming hot shower.   I personally reviewed the patient's medical records has history of major depressive disorder as well as alcohol use disorder, asthma.  He is not on any medications every day.  HPI     Past Medical History:  Diagnosis Date  . Asthma     Patient Active Problem List   Diagnosis Date Noted  . 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 Alcohol use disorder, severe, dependence (HCC) 04/25/2018    History reviewed. No pertinent surgical history.     History reviewed. No pertinent family history.  Social History   Tobacco Use  . Smoking status: Current Every Day Smoker    Packs/day: 0.50    Years: 10.00    Pack years: 5.00    Types: Cigarettes   . Smokeless tobacco: Never Used  Vaping Use  . Vaping Use: Unknown  Substance Use Topics  . Alcohol use: Yes    Alcohol/week: 4.0 standard drinks    Types: 4 Cans of beer per week    Comment: 4-5 cans a week  . Drug use: Yes    Types: Marijuana    Comment: 2 weeks ago    Home Medications Prior to Admission medications   Medication Sig Start Date End Date Taking? Authorizing Provider  ondansetron (ZOFRAN ODT) 4 MG disintegrating tablet Take 1 tablet (4 mg total) by mouth every 8 (eight) hours as needed for nausea or vomiting. 08/12/20  Yes ,  R, PA-C    Allergies    Patient has no known allergies.  Review of Systems   Review of Systems  Constitutional: Positive for activity change, appetite change and chills. Negative for diaphoresis, fatigue and fever.  HENT: Negative.   Respiratory: Negative.   Cardiovascular: Negative.   Gastrointestinal: Positive for abdominal pain, diarrhea, nausea and vomiting. Negative for blood in stool and constipation.  Genitourinary: Negative for dysuria, hematuria, penile pain, penile swelling, scrotal swelling, testicular pain and urgency.  Musculoskeletal: Negative.   Allergic/Immunologic: Negative.   Neurological: Negative.     Physical Exam Updated Vital Signs BP (!) 152/112   Pulse 72   Temp 98.3 F (36.8 C) (Oral)   Resp 18   Ht 5\' 6"  (1.676 m)   Wt 77.1 kg  SpO2 (!) 88%   BMI 27.43 kg/m   Physical Exam Vitals and nursing note reviewed.  Constitutional:      Appearance: He is ill-appearing. He is not diaphoretic.  HENT:     Head: Normocephalic and atraumatic.     Nose: Nose normal.     Mouth/Throat:     Mouth: Mucous membranes are moist.     Pharynx: Oropharynx is clear. Uvula midline. No oropharyngeal exudate or posterior oropharyngeal erythema.     Tonsils: No tonsillar exudate.  Eyes:     General: Lids are normal. Vision grossly intact.        Right eye: No discharge.        Left eye: No discharge.      Extraocular Movements: Extraocular movements intact.     Conjunctiva/sclera:     Right eye: Right conjunctiva is injected.     Left eye: Left conjunctiva is injected.     Pupils: Pupils are equal, round, and reactive to light.  Neck:     Trachea: Trachea and phonation normal.  Cardiovascular:     Rate and Rhythm: Normal rate and regular rhythm.     Pulses: Normal pulses.          Radial pulses are 2+ on the right side and 2+ on the left side.       Dorsalis pedis pulses are 2+ on the right side and 2+ on the left side.     Heart sounds: Normal heart sounds. No murmur heard.   Pulmonary:     Effort: Pulmonary effort is normal. Tachypnea present. No respiratory distress.     Breath sounds: Normal breath sounds. No wheezing or rales.  Chest:     Chest wall: No lacerations, deformity, swelling, tenderness, crepitus or edema.  Abdominal:     General: Bowel sounds are normal. There is no distension.     Palpations: Abdomen is soft.     Tenderness: There is generalized abdominal tenderness and tenderness in the right upper quadrant and epigastric area. There is guarding. There is no right CVA tenderness, left CVA tenderness or rebound. Positive signs include Murphy's sign. Negative signs include Rovsing's sign and McBurney's sign.  Musculoskeletal:        General: No deformity.     Cervical back: Neck supple. No crepitus. No pain with movement or spinous process tenderness.     Right lower leg: No edema.     Left lower leg: No edema.  Lymphadenopathy:     Cervical: No cervical adenopathy.  Skin:    General: Skin is warm and dry.     Capillary Refill: Capillary refill takes less than 2 seconds.  Neurological:     Mental Status: He is alert and oriented to person, place, and time. Mental status is at baseline.     Sensory: Sensation is intact.     Motor: Motor function is intact.  Psychiatric:        Mood and Affect: Mood normal.     ED Results / Procedures / Treatments    Labs (all labs ordered are listed, but only abnormal results are displayed) Labs Reviewed  COMPREHENSIVE METABOLIC PANEL - Abnormal; Notable for the following components:      Result Value   CO2 21 (*)    Glucose, Bld 106 (*)    AST 49 (*)    ALT 50 (*)    All other components within normal limits  CBC - Abnormal; Notable for the following components:  WBC 3.7 (*)    RBC 4.17 (*)    HCT 38.9 (*)    All other components within normal limits  LIPASE, BLOOD  URINALYSIS, ROUTINE W REFLEX MICROSCOPIC    EKG EKG: normal sinus rhythm, no STEMI.   Radiology CT Abdomen Pelvis W Contrast  Result Date: 08/12/2020 CLINICAL DATA:  35 year old male with right abdominal pain for 2 days. EXAM: CT ABDOMEN AND PELVIS WITH CONTRAST TECHNIQUE: Multidetector CT imaging of the abdomen and pelvis was performed using the standard protocol following bolus administration of intravenous contrast. CONTRAST:  OMNIPAQUE IOHEXOL 300 MG/ML  SOLN COMPARISON:  CT chest abdomen and Pelvis 03/27/2019. FINDINGS: Lower chest: Small calcified granuloma redemonstrated in the left lower lobe. Small calcified left hilar lymph node again noted. Otherwise negative. Hepatobiliary: Hepatic steatosis (series 3, image 23). Otherwise negative liver and gallbladder. Pancreas: Negative. Spleen: Negative; tiny calcified granulomas. Adrenals/Urinary Tract: Normal adrenal glands. Bilateral renal enhancement is symmetric and normal. There is a small area of chronic cortical scarring in the right upper pole which is unchanged (coronal image 68). No nephrolithiasis, hydronephrosis or perinephric stranding. Negative ureters and bladder. Stomach/Bowel: Normal appendix on series 3, image 61. Negative large bowel aside from some retained stool. Decompressed and negative terminal ileum. Some fluid-filled small bowel but no dilated loops. Decompressed stomach. Negative duodenum. No free air, free fluid, mesenteric inflammation.  Vascular/Lymphatic: Major arterial structures appear patent and normal. Portal venous system is patent. No lymphadenopathy. Reproductive: Negative. Other: No pelvic free fluid. Musculoskeletal: Stable, negative. IMPRESSION: 1. Normal appendix and no acute or inflammatory process identified in the abdomen or pelvis. 2. Hepatic steatosis. Minor upper pole scarring of the right kidney. Prior granulomatous disease in the left chest. Electronically Signed   By: Odessa Fleming M.D.   On: 08/12/2020 08:09    Procedures Procedures   Medications Ordered in ED Medications  oxyCODONE-acetaminophen (PERCOCET/ROXICET) 5-325 MG per tablet 1 tablet (1 tablet Oral Given 08/12/20 0420)  fentaNYL (SUBLIMAZE) injection 50 mcg (50 mcg Intravenous Given 08/12/20 0655)  sodium chloride 0.9 % bolus 1,000 mL (0 mLs Intravenous Stopped 08/12/20 0727)  iohexol (OMNIPAQUE) 300 MG/ML solution 100 mL (100 mLs Intravenous Contrast Given 08/12/20 0752)  alum & mag hydroxide-simeth (MAALOX/MYLANTA) 200-200-20 MG/5ML suspension 30 mL (30 mLs Oral Given 08/12/20 0936)    And  lidocaine (XYLOCAINE) 2 % viscous mouth solution 15 mL (15 mLs Oral Given 08/12/20 2440)    ED Course  I have reviewed the triage vital signs and the nursing notes.  Pertinent labs & imaging results that were available during my care of the patient were reviewed by me and considered in my medical decision making (see chart for details).    MDM Rules/Calculators/A&P                         35 year old male who presents with concern for 48 hours of right upper quadrant pain, nausea, vomiting, diarrhea.  Denies known sick contacts.  The differential diagnosis for RUQ includes but is not limited to:  Cholelithiasis / choledocholithiasis / cholecystitis / cholangitis, hepatitis (eg. viral, alcoholic, toxic),liver abscess, pancreatitis, liver / pancreatic / biliary tract cancer, ischemic hepatopathy (shock liver), hepatic vein obstruction (Budd-Chiari syndrome), liver  cell adenoma, peptic ulcer disease (duodenal), functional or nonulcer dyspepsia, right lower lobe pneumonia, pyelonephritis, urinary calculi, herpes zoster, trauma or musculoskeletal pain, herniated disk, abdominal abscess, intestinal ischemia, physical or sexual abuse, appendicitis, UTI/renal colic, IBD, cannabis hyperemesis syndrome.   Hypertensive  and tachypneic on intake, vital signs otherwise normal.  Cardiopulmonary exam is normal, abdominal exam revealed guarding with generalized tenderness palpation worse in the epigastrium and right upper quadrant with positive Murphy sign, without tenderness with patient in the lower abdomen.  Laboratory states obtained in triage CBC mild leukopenia 3.7, CMP with mild transaminitis AST/ALT 49/50, UA unremarkable.  Lipase is normal at 33.  Analgesia offered, will proceed with CT of the abdomen pelvis at this time.  CT abdomen pelvis negative for acute intra-abdominal abnormality. GI cocktail offered for epigastric pain.  Patient reevaluated after administration of analgesia and GI cocktail, with total resolution of his nausea and improvement in his abdominal pain.  He feels ready to be discharged home at this time.  Given reassuring physical exam, laboratory studies, and vital signs, no further work-up is warranted in the ED at this time.  Suspect patient symptoms are secondary to cannabis hyperemesis syndrome, given HPI including relief of symptoms with hot showers, extensive cannabis use, and cyclical pattern of symptoms.  Discussed cessation of cannabis use with patient.  Ronnald NianMarquis voiced understanding of his medical evaluation and treatment plan.  Each of his questions was answered to his expressed satisfaction.  Return precautions given. Patient is well-appearing, stable, appropriate for discharge.  This chart was dictated using voice recognition software, Dragon. Despite the best efforts of this provider to proofread and correct errors, errors may still  occur which can change documentation meaning.  Final Clinical Impression(s) / ED Diagnoses Final diagnoses:  Cannabinoid hyperemesis syndrome    Rx / DC Orders ED Discharge Orders         Ordered    ondansetron (ZOFRAN ODT) 4 MG disintegrating tablet  Every 8 hours PRN        08/12/20 1051           , DrewRebekah R, PA-C 08/12/20 1154    Margarita Grizzleay, Danielle, MD 08/14/20 1431

## 2020-08-12 NOTE — ED Notes (Signed)
Discussed effects of htn and his need to follow up with a clinic regarding high blood pressure, understanding of importance voiced.  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  Encouraged him to schedule an appointment with the health department.  Understanding voiced

## 2020-08-12 NOTE — ED Triage Notes (Signed)
Patient arrives from home with RUQ pain that has been intermittent since Friday, also with N/V/D.   EMS vitals 98.1 153/99 80 HR 98% RA 120 CBG 18 L FA 4 MG ZOFRAN

## 2020-08-12 NOTE — ED Notes (Signed)
Pt admits to smoking marijuana excessively at times.  I discussed with him the long term effects and how that can cause abd pain and emesis.  He was not aware of this.  Lupe Carney, PA notified

## 2020-08-12 NOTE — Discharge Instructions (Signed)
You were seen in the ER today for your abdominal pain, nausea, and vomiting.  Your physical exam, laboratory studies, and CT scan were very reassuring.  Your symptoms likely are secondary to cannabis hyperemesis syndrome.  Which is a reaction your body has to cannabis use, which is most commonly relieved by hot showers.  You have been prescribed a nausea medication to take at home as needed.  Additionally you may take over-the-counter Pepto-Bismol or Maalox/Mylanta for relief of your abdominal pain.  Ultimately you will need to completely stop using marijuana in order to avoid recurrence of the symptoms in the future.  Return to the ER if you develop worsening abdominal pain, nausea or vomiting does not stop, chest pain, difficulty breathing, or any other new severe symptoms.

## 2021-01-14 ENCOUNTER — Encounter (HOSPITAL_COMMUNITY): Payer: Self-pay

## 2021-01-14 ENCOUNTER — Emergency Department (HOSPITAL_COMMUNITY): Payer: Self-pay

## 2021-01-14 ENCOUNTER — Emergency Department (HOSPITAL_COMMUNITY)
Admission: EM | Admit: 2021-01-14 | Discharge: 2021-01-14 | Disposition: A | Discharge status: Home / Self Care | Payer: Self-pay | Attending: Emergency Medicine | Admitting: Emergency Medicine

## 2021-01-14 ENCOUNTER — Other Ambulatory Visit: Payer: Self-pay

## 2021-01-14 DIAGNOSIS — J4541 Moderate persistent asthma with (acute) exacerbation: Secondary | ICD-10-CM | POA: Insufficient documentation

## 2021-01-14 DIAGNOSIS — F17210 Nicotine dependence, cigarettes, uncomplicated: Secondary | ICD-10-CM | POA: Insufficient documentation

## 2021-01-14 DIAGNOSIS — J45901 Unspecified asthma with (acute) exacerbation: Secondary | ICD-10-CM

## 2021-01-14 DIAGNOSIS — J069 Acute upper respiratory infection, unspecified: Secondary | ICD-10-CM | POA: Insufficient documentation

## 2021-01-14 DIAGNOSIS — Z20822 Contact with and (suspected) exposure to covid-19: Secondary | ICD-10-CM | POA: Insufficient documentation

## 2021-01-14 LAB — RESP PANEL BY RT-PCR (FLU A&B, COVID) ARPGX2
Influenza A by PCR: NEGATIVE
Influenza B by PCR: NEGATIVE
SARS Coronavirus 2 by RT PCR: NEGATIVE

## 2021-01-14 MED ORDER — ALBUTEROL SULFATE HFA 108 (90 BASE) MCG/ACT IN AERS
1.0000 | INHALATION_SPRAY | RESPIRATORY_TRACT | Status: DC | PRN
  Administered 2021-01-14: 2 via RESPIRATORY_TRACT
  Filled 2021-01-14: qty 6.7

## 2021-01-14 MED ORDER — PREDNISONE 20 MG PO TABS: ORAL_TABLET | ORAL | 0 refills | Status: DC

## 2021-01-14 NOTE — ED Notes (Signed)
Pt's girlfriend at bedside requesting EDP be aware of elevated BP. EDP advises this RN to tell pt to follow up with PCP.

## 2021-01-14 NOTE — ED Triage Notes (Signed)
Patient complains of 1 week of congestion and cough and states some chest tightness from his asthma. Smoker and does not have inhaler. Alert and oriented, NAD

## 2021-01-14 NOTE — ED Notes (Signed)
RN able to exam pt, pt reports cough and congestion x1 week

## 2021-01-14 NOTE — ED Provider Notes (Signed)
[facility] EMERGENCY DEPARTMENT Provider Note   CSN: [id number] Arrival date & time: 01/14/21  5621     History No chief complaint on file.   Aarit Kashuba is a 35 y.o. male.  Patient is a 35 year old male with a history of asthma who presents with wheezing and cough.  He reports a 1 week history of some chest congestion and coughing.  He has had a little bit of a runny nose.  No known fevers.  He has some wheezing and some tightness in his chest which he says feels like his prior asthma exacerbations.  No other chest pain.  No leg swelling.  He ran out of his inhaler.  He has not had to use in a while.  He does smoke cigarettes.      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 No pertinent surgical history.     No family history on file.  Social History   Tobacco Use   Smoking status: Every Day    Packs/day: 0.50    Years: 10.00    Pack years: 5.00    Types: Cigarettes   Smokeless tobacco: Never  Vaping Use   Vaping Use: Unknown  Substance Use Topics   Alcohol use: Yes    Alcohol/week: 4.0 standard drinks    Types: 4 Cans of beer per week    Comment: 4-5 cans a week   Drug use: Yes    Types: Marijuana    Comment: 2 weeks ago    Home Medications Prior to Admission medications   Medication Sig Start Date End Date Taking? Authorizing Provider  predniSONE (DELTASONE) 20 MG tablet 3 tabs po day one, then 2 po daily x 4 days 01/14/21  Yes Rolan Bucco, MD  ondansetron (ZOFRAN ODT) 4 MG disintegrating tablet Take 1 tablet (4 mg total) by mouth every 8 (eight) hours as needed for nausea or vomiting. 08/12/20   Sponseller, Eugene Gavia, PA-C     Allergies    Patient has no known allergies.  Review of Systems   Review of Systems  Constitutional:  Negative for chills, diaphoresis, fatigue and fever.  HENT:  Negative for congestion, rhinorrhea and sneezing.   Eyes: Negative.   Respiratory:  Positive for cough, chest tightness and wheezing. Negative for shortness of breath.   Cardiovascular:  Negative for chest pain and leg swelling.  Gastrointestinal:  Negative for abdominal pain, blood in stool, diarrhea, nausea and vomiting.  Genitourinary:  Negative for difficulty urinating, flank pain, frequency and hematuria.  Musculoskeletal:  Negative for arthralgias and back pain.  Skin:  Negative for rash.  Neurological:  Negative for dizziness, speech difficulty, weakness, numbness and headaches.   Physical Exam Updated Vital Signs BP (!) 142/102 (BP Location: Left Arm)   Pulse 85   Temp 99.2 F (37.3 C) (Oral)   Resp 17   SpO2 100%   Physical Exam Constitutional:      Appearance: He is well-developed.  HENT:     Head: Normocephalic and atraumatic.  Eyes:     Pupils: Pupils are equal, round, and reactive to light.  Cardiovascular:     Rate and Rhythm: Normal rate and regular rhythm.  Heart sounds: Normal heart sounds.  Pulmonary:     Effort: Pulmonary effort is normal. No respiratory distress.     Breath sounds: Wheezing present. No rales.     Comments: Few scattered wheezes, no increased work of breathing Chest:     Chest wall: No tenderness.  Abdominal:     General: Bowel sounds are normal.     Palpations: Abdomen is soft.     Tenderness: There is no abdominal tenderness. There is no guarding or rebound.  Musculoskeletal:        General: Normal range of motion.     Cervical back: Normal range of motion and neck supple.     Comments: No edema or calf tenderness  Lymphadenopathy:     Cervical: No cervical adenopathy.  Skin:    General: Skin is warm and dry.     Findings: No rash.  Neurological:      Mental Status: He is alert and oriented to person, place, and time.    ED Results / Procedures / Treatments   Labs (all labs ordered are listed, but only abnormal results are displayed) Labs Reviewed  RESP PANEL BY RT-PCR (FLU A&B, COVID) [id number]    EKG EKG Interpretation  Date/Time:  Monday January 14 2021 09:53:02 EDT Ventricular Rate:  78 PR Interval:  154 QRS Duration: 78 QT Interval:  362 QTC Calculation: 412 R Axis:   80 Text Interpretation: Normal sinus rhythm Normal ECG since last tracing no significant change Confirmed by Rolan Bucco [phone]) on 01/14/2021 1:17:43 PM  Radiology DG Chest 2 View  Result Date: 01/14/2021 CLINICAL DATA:  Pt c/o cough and congestion x 1 day. Hx of asthma. Pt is a current smoker. EXAM: CHEST - 2 VIEW COMPARISON:  03/16/2017 FINDINGS: Chronic coarse perihilar interstitial prominence. No focal infiltrate or overt edema. Heart size and mediastinal contours are within normal limits. No effusion. Visualized bones unremarkable. IMPRESSION: No acute cardiopulmonary disease. Electronically Signed   By: Corlis Leak M.D.   On: 01/14/2021 14:32    Procedures Procedures   Medications Ordered in ED Medications  albuterol (VENTOLIN HFA) 108 (90 Base) MCG/ACT inhaler 1-2 puff (2 puffs Inhalation Given 01/14/21 1448)    ED Course  I have reviewed the triage vital signs and the nursing notes.  Pertinent labs & imaging results that were available during my care of the patient were reviewed by me and considered in my medical decision making (see chart for details).    MDM Rules/Calculators/A&P                           Patient is a 35 year old male who presents with cough and wheezing.  Chest x-ray shows no evidence of pneumonia.  He is otherwise well-appearing.  He was dispensed an albuterol inhaler.  I will give him prescription for a 5-day course of prednisone.  COVID test was done but is pending.  He will follow-up on this on his MyChart account.  He is  out of the window for oral antiviral treatment.  Return precautions were given. Final Clinical Impression(s) / ED Diagnoses Final diagnoses:  Upper respiratory tract infection, unspecified type  Moderate asthma with acute exacerbation, unspecified whether persistent    Rx / DC Orders ED Discharge Orders          Ordered    predniSONE (DELTASONE) 20 MG tablet        01/14/21 1454  Rolan Bucco, MD 01/14/21 1455

## 2021-04-16 ENCOUNTER — Other Ambulatory Visit: Payer: Self-pay

## 2021-04-16 ENCOUNTER — Encounter (HOSPITAL_COMMUNITY): Payer: Self-pay | Admitting: Emergency Medicine

## 2021-04-16 ENCOUNTER — Other Ambulatory Visit (HOSPITAL_COMMUNITY): Payer: Self-pay

## 2021-04-16 ENCOUNTER — Ambulatory Visit (HOSPITAL_COMMUNITY)
Admission: EM | Admit: 2021-04-16 | Discharge: 2021-04-16 | Disposition: A | Discharge status: Home / Self Care | Payer: Self-pay | Attending: Emergency Medicine | Admitting: Emergency Medicine

## 2021-04-16 ENCOUNTER — Telehealth (HOSPITAL_COMMUNITY): Payer: Self-pay

## 2021-04-16 DIAGNOSIS — R220 Localized swelling, mass and lump, head: Secondary | ICD-10-CM

## 2021-04-16 DIAGNOSIS — K0889 Other specified disorders of teeth and supporting structures: Secondary | ICD-10-CM

## 2021-04-16 MED ORDER — AMOXICILLIN-POT CLAVULANATE 875-125 MG PO TABS
1.0000 | ORAL_TABLET | Freq: Two times a day (BID) | ORAL | 0 refills | Status: DC
  Filled 2021-04-16: qty 14, 7d supply, fill #0

## 2021-04-16 MED ORDER — LIDOCAINE VISCOUS HCL 2 % MT SOLN: 15.0000 mL | OROMUCOSAL | 0 refills | Status: DC | PRN

## 2021-04-16 MED ORDER — LIDOCAINE VISCOUS HCL 2 % MT SOLN
15.0000 mL | OROMUCOSAL | 0 refills | Status: DC | PRN
  Filled 2021-04-16: qty 100, 7d supply, fill #0

## 2021-04-16 MED ORDER — PREDNISONE 20 MG PO TABS: 40.0000 mg | ORAL_TABLET | Freq: Every day | ORAL | 0 refills | Status: DC

## 2021-04-16 MED ORDER — AMOXICILLIN-POT CLAVULANATE 875-125 MG PO TABS: 1.0000 | ORAL_TABLET | Freq: Two times a day (BID) | ORAL | 0 refills | Status: DC

## 2021-04-16 MED ORDER — PREDNISONE 20 MG PO TABS
40.0000 mg | ORAL_TABLET | Freq: Every day | ORAL | 0 refills | Status: DC
  Filled 2021-04-16: qty 10, 5d supply, fill #0

## 2021-04-16 NOTE — ED Provider Notes (Signed)
[place]    CSN: [id number] Arrival date & time: 04/16/21  1125      History   Chief Complaint Chief Complaint  Patient presents with   Facial Swelling    HPI Howard Adams is a 35 y.o. male.   Patient presents with right-sided facial swelling for 4 days, 2 days of associated pain.  Painful to chew.  Denies radiation of pain, difficulty swallowing or difficulty breathing, fever, chills.  Possible need for dental work.  Does not have a Pharmacist, community or insurance.   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 No pertinent surgical history.     Home Medications    Prior to Admission medications   Medication Sig Start Date End Date Taking? Authorizing Provider  ondansetron (ZOFRAN ODT) 4 MG disintegrating tablet Take 1 tablet (4 mg total) by mouth every 8 (eight) hours as needed for nausea or vomiting. 08/12/20   Sponseller, Eugene Garnet R, PA-C  predniSONE (DELTASONE) 20 MG tablet 3 tabs po day one, then 2 po daily x 4 days 01/14/21   Malvin Johns, MD    Family History Family History  Problem Relation Age of Onset   Hypertension Father     Social History Social History   Tobacco Use   Smoking status: Every Day    Packs/day: 0.50    Years: 10.00    Pack years: 5.00    Types: Cigarettes   Smokeless tobacco: Never  Vaping Use   Vaping Use: Unknown  Substance Use Topics   Alcohol use: Yes    Alcohol/week: 4.0 standard drinks    Types: 4 Cans of beer per week    Comment: 4-5 cans a week   Drug use: Yes    Types: Marijuana    Comment: 2 weeks ago     Allergies   Patient has no known allergies.   Review of Systems Review of Systems   Constitutional: Negative.   HENT:  Positive for facial swelling. Negative for congestion, dental problem, drooling, ear discharge, ear pain, hearing loss, mouth sores, nosebleeds, postnasal drip, rhinorrhea, sinus pressure, sinus pain, sneezing, sore throat, tinnitus, trouble swallowing and voice change.   Respiratory: Negative.    Cardiovascular: Negative.   Skin: Negative.   Neurological: Negative.     Physical Exam Triage Vital Signs ED Triage Vitals [04/16/21 1318]  Enc Vitals Group     BP (!) 161/111     Pulse Rate 83     Resp 16     Temp 98.9 F (37.2 C)     Temp Source Oral     SpO2 98 %     Weight      Height      Head Circumference      Peak Flow      Pain Score 8     Pain Loc      Pain Edu?      Excl. in [place]?    No data found.  Updated Vital Signs BP (!) 161/111 (BP Location: Left Arm)   Pulse 83   Temp 98.9 F (37.2 C) (Oral)   Resp 16   SpO2 98%   Visual Acuity Right Eye Distance:  Left Eye Distance:   Bilateral Distance:    Right Eye Near:   Left Eye Near:    Bilateral Near:     Physical Exam Constitutional:      Appearance: Normal appearance. He is normal weight.  HENT:     Head: Normocephalic.     Jaw: Swelling present.     Mouth/Throat:      Comments: Partial to of the first or second premolar, dental decay present, possible abscess noted, moderate to severe gingival swelling, no drainage noted Neurological:     Mental Status: He is alert.     UC Treatments / Results  Labs (all labs ordered are listed, but only abnormal results are displayed) Labs Reviewed - No data to display  EKG   Radiology No results found.  Procedures Procedures (including critical care time)  Medications Ordered in UC Medications - No data to display  Initial Impression / Assessment and Plan / UC Course  I have reviewed the triage vital signs and the nursing notes.  Pertinent labs & imaging results that were available during my care of the  patient were reviewed by me and considered in my medical decision making (see chart for details).  Dental pain Right facial swelling  1.  Augmentin 875-120 mg twice daily for 7 days  2.  Lidocaine viscous 2% 15 mils every 4 hours 3.  Prednisone 40 mg daily for 5 days 4.  Patient given written handout of local dental resources to follow-up with Final Clinical Impressions(s) / UC Diagnoses   Final diagnoses:  None   Discharge Instructions   None    ED Prescriptions   None    PDMP not reviewed this encounter.   Valinda Hoar, NP 04/16/21 1443

## 2021-04-16 NOTE — Discharge Instructions (Signed)
Inside your packet are local dental resources, please reach out for follow-up  Take Augmentin twice a day for 7 days, this will treat any possible infection related to your tooth  You do take prednisone starting tomorrow every morning with food for 5 days to help reduce swelling and inflammation, if unable to afford prednisone you may take ibuprofen 800 mg every 8 hours  You may gargle and spit lidocaine solution every 4 hours as needed for additional comfort, can do Listerine and salt water gargles in addition

## 2021-04-16 NOTE — ED Triage Notes (Signed)
Pt presents with right side facial pain Xs 3 day.

## 2021-04-26 ENCOUNTER — Ambulatory Visit (HOSPITAL_COMMUNITY)
Admission: EM | Admit: 2021-04-26 | Discharge: 2021-04-26 | Disposition: A | Discharge status: Home / Self Care | Payer: No Payment, Other | Attending: Psychiatry | Admitting: Psychiatry

## 2021-04-26 DIAGNOSIS — F1094 Alcohol use, unspecified with alcohol-induced mood disorder: Secondary | ICD-10-CM | POA: Insufficient documentation

## 2021-04-26 DIAGNOSIS — Z5982 Transportation insecurity: Secondary | ICD-10-CM | POA: Insufficient documentation

## 2021-04-26 NOTE — BH Assessment (Signed)
Pt presents to [facility] with [employer] under the influence of alcohol. Pt states that he last used alcohol 10 minutes prior to arriving to [facility]. Pt states that he has had a disagreement with the mother of his children and is seeking substance use treatment for his alcohol use. Pt states that he has been having thought of SI and passive thoughts of HI towards a few family members. Pt states that he would benefit from housing resources and substance use treatment. Pt denies AVH.

## 2021-04-26 NOTE — Discharge Instructions (Addendum)
Patient is instructed prior to discharge to:  Take all medications as prescribed by his/her mental healthcare provider. Report any adverse effects and or reactions from the medicines to his/her outpatient provider promptly. Keep all scheduled appointments, to ensure that you are getting refills on time and to avoid any interruption in your medication.  If you are unable to keep an appointment call to reschedule.  Be sure to follow-up with resources and follow-up appointments provided.  Patient has been instructed & cautioned: To not engage in alcohol and or illegal drug use while on prescription medicines. In the event of worsening symptoms, patient is instructed to call the crisis hotline, 911 and or go to the nearest ED for appropriate evaluation and treatment of symptoms. To follow-up with his/her primary care provider for your other medical issues, concerns and or health care needs.    Substance Abuse Treatment Resources listed Below:  [facility] - Admissions are currently completed Monday through Friday at 8am; both appointments and walk-ins are accepted.  Any individual that is a [place] resident may present for a substance abuse screening and assessment for admission.  A person may be referred by numerous sources or self-refer.   Potential clients will be screened for medical necessity and appropriateness for the program.  Clients must meet criteria for high-intensity residential treatment services.  If clinically appropriate, a client will continue with the comprehensive clinical assessment and intake process, as well as enrollment in the [facility].  Address: [address] [place], [place] [address] Admin Hours: Mon-Fri 8AM to 5PM Center Hours: 24/7 Phone: [phone] Fax: [phone]  [facility] Address: [address], [place], [place] [address] Behavioral [facility] Urgent Care ([facility]) Hours: 24/7 Phone:  [phone] Fax: [phone]  [facility]): (offers outpatient therapy and intensive outpatient substance abuse therapy).  [address], [place], [place] [address] Phone: [phone]  [facility]: Offers FREE recovery skills classes, support groups, 1:1 Peer Support, and Compeer Classes. [address], [place], [place] [address] Phone: [phone] (Call to complete intake).   [facility] [address] [place], [place] [address] Phone: [phone] ext [phone] [facility] provides food, shelter and other programs and services to the homeless men of [place] through our men's program.  By offering safe shelter, three meals a day, clean clothing, Biblical counseling, financial planning, vocational training, GED/education and employment assistance, we've helped mend the shattered lives of many homeless men since opening in 1974.  We have approximately 267 beds available, with a max of 312 beds including mats for emergency situations and currently house an average of 270 men a night.  Prospective Client Check-In Information Photo ID Required (State/ Out of State/ DOC) - if photo ID is not available, clients are required to have a printout of a police/sheriff's criminal history report. Help out with chores around the Mission. No sex offender of any type (pending, charged, registered and/or any other sex related offenses) will be permitted to check in. Must be willing to abide by all rules, regulations, and policies established by the [facility]. The following will be provided - shelter, food, clothing, and biblical counseling. If you or someone you know is in need of assistance at our men's shelter in [place], [place], please call [phone] ext. [phone].  [facility]-will provide timely access to mental health services for children and adolescents (4-17) and adults    presenting in a mental health crisis. The program is designed for those who need urgent Behavioral Health or Substance Use treatment and are not experiencing a medical crisis that would typically require an emergency room visit.    [address] [place], [place] [address] Phone: [phone] [email or web address]  [facility]: Phone#: [phone]  The [facility] SA Intensive Outpatient Program (SAIOP) means structured individual and group addiction activities and services that are provided at an outpatient program designed to assist adult and adolescent consumers to begin recovery and learn skills for recovery maintenance. The [facility] SAIOP program is offered at least 3 hours a day, 3 days a week.SAIOP services shall include a structured program consisting of, but not limited to, the following services: Individual counseling and support; Group counseling and support; Family counseling, training or support; Biochemical assays to identify recent drug use (e.g., urine drug screens); Strategies for relapse prevention to include community and social support systems in treatment; Life skills; Crisis contingency planning; Disease Management; and Treatment support activities that have been adapted or specifically designed for persons with physical disabilities, or persons with co-occurring disorders of mental illness and substance abuse/dependence or mental retardation/developmental disability and substance abuse/dependence. Phone: [phone]  Address:   [facility] [place] [facility] will also offer the following outpatient services: (Monday through Friday 8am-5pm)   Partial Hospitalization Program (PHP) Substance Abuse Intensive Outpatient Program (SA-IOP) Group Therapy Medication Management Peer Living Room We also provide (24/7):  Assessments: Our mental health clinician and providers will conduct a focused mental health evaluation, assessing for immediate  safety concerns and further mental health needs. Referral: Our team will provide resources and help connect to community based mental health treatment, when indicated, including psychotherapy, psychiatry, and other specialized behavioral health or substance use disorder services (for those not already in treatment). Transitional Care: Our team providers in person bridging and/or telephonic follow-up during the patient's transition to outpatient services.  [facility] 24-Hour Call Center: 1-800-256-2452 Behavioral Health Crisis Line: [phone]  
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
## 2021-04-26 NOTE — ED Notes (Signed)
Pt discharged with  AVS.  AVS reviewed prior to discharge. Pt threw AVS to ground after presented it.  Pt alert, oriented, and ambulatory.  Safety maintained.

## 2021-05-03 ENCOUNTER — Telehealth (HOSPITAL_COMMUNITY): Payer: Self-pay

## 2021-05-03 NOTE — BH Assessment (Signed)
Care Management - [facility] Discharge Follow Up   ° °Writer attempted to make contact with patient today and was unsuccessful.  Writer left a HIPPA compliant voice message.  ° °Per chart review, patient was provided with outpatient resources. ° °

## 2021-08-16 ENCOUNTER — Ambulatory Visit (HOSPITAL_COMMUNITY)
Admission: EM | Admit: 2021-08-16 | Discharge: 2021-08-16 | Disposition: A | Discharge status: Home / Self Care | Payer: Self-pay | Attending: Urgent Care | Admitting: Urgent Care

## 2021-08-16 ENCOUNTER — Encounter (HOSPITAL_COMMUNITY): Payer: Self-pay

## 2021-08-16 DIAGNOSIS — H60392 Other infective otitis externa, left ear: Secondary | ICD-10-CM

## 2021-08-16 MED ORDER — CIPROFLOXACIN HCL 0.3 % OP SOLN: OPHTHALMIC | 0 refills | Status: DC

## 2021-08-16 NOTE — ED Triage Notes (Signed)
5 day h/o left ear pain. Pt reports for the last three days he's been using otc ear drops and using left over amoxicillin. He reports the pain has resolved, but he c/o feeling like his ear is clogged. ?

## 2021-08-16 NOTE — Discharge Instructions (Signed)
Your left ear canal has an infection.  ?The ear drops were >$100. ?I have called in an eye drop for you which should cost no more than $12 with a GoodRx coupon. THIS IS NOT FOR YOUR EYE - please place the drops in your ear. Put 4 drops in the left ear twice daily x 7 days. ?Please lay on your R side with L ear upwards for 10 minutes after administering. ?Do not swim or put anything in the ear canal x 1 week ?

## 2021-08-16 NOTE — ED Provider Notes (Signed)
?[facility] ? ? ? ?CSN: [id number] ?Arrival date & time: 08/16/21  1926 ? ? ?  ? ?History   ?Chief Complaint ?Chief Complaint  ?Patient presents with  ? Otalgia  ?  left  ? ? ?HPI ?Howard Adams is a 36 y.o. male.  ? ?36yo male presents with 5d hx of L ear pain. States 40mo ago he had a R ear infection and had "left over amoxicillin." Has been taking this for 5 days with some relief to the pain, but states it still hurts to open mouth or touch his ear. He did try OTC ear drops without relief. States he is still having a hard time hearing out of the L. Denies fever, or any additional URI sx. No recent swimming. ? ? ?Otalgia ? ?Past Medical History:  ?Diagnosis Date  ? Asthma   ? ? ?Patient Active Problem List  ? Diagnosis Date Noted  ? 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 Alcohol use disorder, severe, dependence (HCC) 04/25/2018  ? ? ?History reviewed. No pertinent surgical history. ? ? ? ? ?Home Medications   ? ?Prior to Admission medications   ?Medication Sig Start Date End Date Taking? Authorizing Provider  ?ciprofloxacin (CILOXAN) 0.3 % ophthalmic solution Place 4 drops in the left ear twice daily x 7 days 08/16/21  Yes Qamar Rosman L, PA  ? ? ?Family History ?Family History  ?Problem Relation Age of Onset  ? Hypertension Father   ? ? ?Social History ?Social History  ? ?Tobacco Use  ? Smoking status: Every Day  ?  Packs/day: 0.50  ?  Years: 10.00  ?  Pack years: 5.00  ?  Types: Cigarettes  ? Smokeless tobacco: Never  ?Vaping Use  ? Vaping Use: Unknown  ?Substance Use Topics  ? Alcohol use: Yes  ?  Alcohol/week: 4.0 standard drinks  ?  Types: 4 Cans of beer per week  ?  Comment: 4-5 cans a week  ? Drug use: Yes  ?  Types: Marijuana  ?  Comment: 2 weeks ago  ? ? ? ?Allergies   ?Patient has no known allergies. ? ? ?Review of Systems ?Review of Systems  ?HENT:  Positive for  ear pain.   ?All other systems reviewed and are negative. ? ? ?Physical Exam ?Triage Vital Signs ?ED Triage Vitals  ?Enc Vitals Group  ?   BP 08/16/21 1944 119/83  ?   Pulse Rate 08/16/21 1944 78  ?   Resp 08/16/21 1944 17  ?   Temp 08/16/21 1944 98.4 ?F (36.9 ?C)  ?   Temp Source 08/16/21 1944 Oral  ?   SpO2 08/16/21 1944 96 %  ?   Weight --   ?   Height --   ?   Head Circumference --   ?   Peak Flow --   ?   Pain Score 08/16/21 1943 0  ?   Pain Loc --   ?   Pain Edu? --   ?   Excl. in GC? --   ? ?No data found. ? ?Updated Vital Signs ?BP 119/83 (BP Location: Right Arm)   Pulse 78   Temp 98.4 ?F (36.9 ?C) (Oral)   Resp 17   SpO2 96%  ? ?Visual Acuity ?Right Eye Distance:   ?Left Eye Distance:   ?Bilateral Distance:   ? ?Right Eye Near:   ?Left Eye Near:    ?  Bilateral Near:    ? ?Physical Exam ?Vitals reviewed.  ?Constitutional:   ?   General: He is not in acute distress. ?   Appearance: Normal appearance. He is normal weight. He is not ill-appearing, toxic-appearing or diaphoretic.  ?HENT:  ?   Head: Normocephalic and atraumatic. No right periorbital erythema or left periorbital erythema.  ?   Jaw: There is normal jaw occlusion. No trismus, tenderness, swelling, pain on movement or malocclusion.  ?   Salivary Glands: Right salivary gland is not diffusely enlarged or tender. Left salivary gland is not diffusely enlarged.  ?   Right Ear: Hearing, tympanic membrane, ear canal and external ear normal. No drainage, swelling or tenderness. No middle ear effusion. There is no impacted cerumen. No mastoid tenderness. Tympanic membrane is not erythematous or bulging.  ?   Left Ear: Tympanic membrane and external ear normal. Decreased hearing noted. Drainage, swelling and tenderness present. There is no impacted cerumen. Tympanic membrane is not erythematous or bulging. Tympanic membrane has normal mobility.  ?   Ears:  ?   Comments: L EAC edematous with discharge. No bleeding ?TM intact.  ?   Nose: Nose normal. No nasal  deformity, signs of injury, nasal tenderness or congestion.  ?   Right Turbinates: Not enlarged or swollen.  ?   Left Turbinates: Not enlarged or swollen.  ?   Right Sinus: No maxillary sinus tenderness or frontal sinus tenderness.  ?   Left Sinus: No maxillary sinus tenderness or frontal sinus tenderness.  ?   Mouth/Throat:  ?   Lips: Pink. No lesions.  ?   Mouth: Mucous membranes are moist. No injury or oral lesions.  ?   Tongue: No lesions.  ?   Palate: No lesions.  ?   Pharynx: Oropharynx is clear. Uvula midline. No pharyngeal swelling, oropharyngeal exudate or posterior oropharyngeal erythema.  ?   Tonsils: No tonsillar exudate or tonsillar abscesses.  ?Neurological:  ?   Mental Status: He is alert.  ? ? ? ?UC Treatments / Results  ?Labs ?(all labs ordered are listed, but only abnormal results are displayed) ?Labs Reviewed - No data to display ? ?EKG ? ? ?Radiology ?No results found. ? ?Procedures ?Procedures (including critical care time) ? ?Medications Ordered in UC ?Medications - No data to display ? ?Initial Impression / Assessment and Plan / UC Course  ?I have reviewed the triage vital signs and the nursing notes. ? ?Pertinent labs & imaging results that were available during my care of the patient were reviewed by me and considered in my medical decision making (see chart for details). ? ?  ? ?L OE - pt does not have insurance and cannot afford Ciprodex. We discussed options, and will try cipro eye drops with understanding this is for the L ear. 4 drops BID x 7 days. Pt opting to forgo the steroid drop component due to cost, and will continue to take PRN NSAID. Stop PO abx as there is no indication for this. Avoid Q tips, ear buds, or anything in the ear. ? ?Final Clinical Impressions(s) / UC Diagnoses  ? ?Final diagnoses:  ?Infective otitis externa of left ear  ? ? ? ?Discharge Instructions   ? ?  ?Your left ear canal has an infection.  ?The ear drops were >$100. ?I have called in an eye drop for you  which should cost no more than $12 with a GoodRx coupon. THIS IS NOT FOR YOUR EYE - please place the drops  in your ear. Put 4 drops in the left ear twice daily x 7 days. ?Please lay on your R side with L ear upwards for 10 minutes after administering. ?Do not swim or put anything in the ear canal x 1 week ? ? ? ? ?ED Prescriptions   ? ? Medication Sig Dispense Auth. Provider  ? ciprofloxacin (CILOXAN) 0.3 % ophthalmic solution Place 4 drops in the left ear twice daily x 7 days 5 mL Cherisse Carrell L, PA  ? ?  ? ?PDMP not reviewed this encounter. ?  Maretta Bees, [place] ?08/16/21 2045 ? ?

## 2022-03-11 ENCOUNTER — Ambulatory Visit (HOSPITAL_COMMUNITY)
Admission: EM | Admit: 2022-03-11 | Discharge: 2022-03-12 | Disposition: A | Discharge status: Home / Self Care | Payer: No Payment, Other | Attending: Psychiatry | Admitting: Psychiatry

## 2022-03-11 DIAGNOSIS — F32A Depression, unspecified: Secondary | ICD-10-CM | POA: Insufficient documentation

## 2022-03-11 DIAGNOSIS — Z1152 Encounter for screening for COVID-19: Secondary | ICD-10-CM | POA: Insufficient documentation

## 2022-03-11 DIAGNOSIS — F10229 Alcohol dependence with intoxication, unspecified: Secondary | ICD-10-CM | POA: Insufficient documentation

## 2022-03-11 DIAGNOSIS — F10920 Alcohol use, unspecified with intoxication, uncomplicated: Secondary | ICD-10-CM

## 2022-03-11 DIAGNOSIS — F1020 Alcohol dependence, uncomplicated: Secondary | ICD-10-CM

## 2022-03-11 DIAGNOSIS — F1290 Cannabis use, unspecified, uncomplicated: Secondary | ICD-10-CM | POA: Insufficient documentation

## 2022-03-11 DIAGNOSIS — F1490 Cocaine use, unspecified, uncomplicated: Secondary | ICD-10-CM | POA: Insufficient documentation

## 2022-03-11 LAB — COMPREHENSIVE METABOLIC PANEL
ALT: 44 U/L (ref 0–44)
AST: 27 U/L (ref 15–41)
Albumin: 3.2 g/dL — ABNORMAL LOW (ref 3.5–5.0)
Alkaline Phosphatase: 56 U/L (ref 38–126)
Anion gap: 10 (ref 5–15)
BUN: 10 mg/dL (ref 6–20)
CO2: 21 mmol/L — ABNORMAL LOW (ref 22–32)
Calcium: 8.6 mg/dL — ABNORMAL LOW (ref 8.9–10.3)
Chloride: 113 mmol/L — ABNORMAL HIGH (ref 98–111)
Creatinine, Ser: 0.91 mg/dL (ref 0.61–1.24)
GFR, Estimated: >60 mL/min (ref >60)
Glucose, Bld: 99 mg/dL (ref 70–99)
Potassium: 3.7 mmol/L (ref 3.5–5.1)
Sodium: 144 mmol/L (ref 135–145)
Total Bilirubin: 0.3 mg/dL (ref 0.3–1.2)
Total Protein: 5.9 g/dL — ABNORMAL LOW (ref 6.5–8.1)

## 2022-03-11 LAB — LDL CHOLESTEROL, DIRECT: Direct LDL: 49 mg/dL (ref 0–99)

## 2022-03-11 LAB — POCT URINE DRUG SCREEN - MANUAL ENTRY (I-SCREEN)
POC Amphetamine UR: NOT DETECTED
POC Buprenorphine (BUP): NOT DETECTED
POC Cocaine UR: NOT DETECTED
POC Marijuana UR: POSITIVE — AB
POC Methadone UR: NOT DETECTED
POC Methamphetamine UR: POSITIVE — AB
POC Morphine: NOT DETECTED
POC Oxazepam (BZO): NOT DETECTED
POC Oxycodone UR: NOT DETECTED
POC Secobarbital (BAR): NOT DETECTED

## 2022-03-11 LAB — CBC WITH DIFFERENTIAL/PLATELET
Abs Immature Granulocytes: 0.02 10*3/uL (ref 0.00–0.07)
Basophils Absolute: 0 10*3/uL (ref 0.0–0.1)
Basophils Relative: 1 %
Eosinophils Absolute: 0.1 10*3/uL (ref 0.0–0.5)
Eosinophils Relative: 2 %
HCT: 42.9 % (ref 39.0–52.0)
Hemoglobin: 15.5 g/dL (ref 13.0–17.0)
Immature Granulocytes: 0 %
Lymphocytes Relative: 41 %
Lymphs Abs: 2.2 10*3/uL (ref 0.7–4.0)
MCH: 34.2 pg — ABNORMAL HIGH (ref 26.0–34.0)
MCHC: 36.1 g/dL — ABNORMAL HIGH (ref 30.0–36.0)
MCV: 94.7 fL (ref 80.0–100.0)
Monocytes Absolute: 0.4 10*3/uL (ref 0.1–1.0)
Monocytes Relative: 7 %
Neutro Abs: 2.7 10*3/uL (ref 1.7–7.7)
Neutrophils Relative %: 49 %
Platelets: 277 10*3/uL (ref 150–400)
RBC: 4.53 MIL/uL (ref 4.22–5.81)
RDW: 12.9 % (ref 11.5–15.5)
WBC: 5.4 10*3/uL (ref 4.0–10.5)
nRBC: 0 % (ref 0.0–0.2)

## 2022-03-11 LAB — LIPID PANEL
Cholesterol: 157 mg/dL (ref 0–200)
HDL: 60 mg/dL (ref >40)
LDL Cholesterol: UNDETERMINED mg/dL (ref 0–99)
Total CHOL/HDL Ratio: 2.6 RATIO
Triglycerides: 548 mg/dL — ABNORMAL HIGH (ref <150)
VLDL: UNDETERMINED mg/dL (ref 0–40)

## 2022-03-11 LAB — HEMOGLOBIN A1C
Hgb A1c MFr Bld: 4.9 % (ref 4.8–5.6)
Mean Plasma Glucose: 93.93 mg/dL

## 2022-03-11 LAB — RESP PANEL BY RT-PCR (FLU A&B, COVID) ARPGX2
Influenza A by PCR: NEGATIVE
Influenza B by PCR: NEGATIVE
SARS Coronavirus 2 by RT PCR: NEGATIVE

## 2022-03-11 LAB — TSH: TSH: 1.019 u[IU]/mL (ref 0.350–4.500)

## 2022-03-11 LAB — ETHANOL: Alcohol, Ethyl (B): 269 mg/dL — ABNORMAL HIGH (ref <10)

## 2022-03-11 LAB — MAGNESIUM: Magnesium: 2.3 mg/dL (ref 1.7–2.4)

## 2022-03-11 MED ORDER — ADULT MULTIVITAMIN W/MINERALS CH
1.0000 | ORAL_TABLET | Freq: Every day | ORAL | Status: DC
  Administered 2022-03-11: 1 via ORAL
  Filled 2022-03-11: qty 1

## 2022-03-11 MED ORDER — ACETAMINOPHEN 325 MG PO TABS: 650.0000 mg | ORAL_TABLET | Freq: Four times a day (QID) | ORAL | Status: DC | PRN

## 2022-03-11 MED ORDER — MAGNESIUM HYDROXIDE 400 MG/5ML PO SUSP: 30.0000 mL | Freq: Every day | ORAL | Status: DC | PRN

## 2022-03-11 MED ORDER — NICOTINE 21 MG/24HR TD PT24
21.0000 mg | MEDICATED_PATCH | Freq: Once | TRANSDERMAL | Status: DC
  Administered 2022-03-11: 21 mg via TRANSDERMAL

## 2022-03-11 MED ORDER — ALUM & MAG HYDROXIDE-SIMETH 200-200-20 MG/5ML PO SUSP: 30.0000 mL | ORAL | Status: DC | PRN

## 2022-03-11 MED ORDER — LORAZEPAM 1 MG PO TABS
1.0000 mg | ORAL_TABLET | Freq: Four times a day (QID) | ORAL | Status: DC | PRN
  Administered 2022-03-11: 1 mg via ORAL
  Filled 2022-03-11: qty 1

## 2022-03-11 MED ORDER — ONDANSETRON 4 MG PO TBDP: 4.0000 mg | ORAL_TABLET | Freq: Four times a day (QID) | ORAL | Status: DC | PRN

## 2022-03-11 MED ORDER — LOPERAMIDE HCL 2 MG PO CAPS: 2.0000 mg | ORAL_CAPSULE | ORAL | Status: DC | PRN

## 2022-03-11 MED ORDER — THIAMINE MONONITRATE 100 MG PO TABS: 100.0000 mg | ORAL_TABLET | Freq: Every day | ORAL | Status: DC

## 2022-03-11 MED ORDER — THIAMINE HCL 100 MG/ML IJ SOLN
100.0000 mg | Freq: Once | INTRAMUSCULAR | Status: DC
  Filled 2022-03-11: qty 2

## 2022-03-11 MED ORDER — TRAZODONE HCL 50 MG PO TABS
50.0000 mg | ORAL_TABLET | Freq: Every evening | ORAL | Status: DC | PRN
  Administered 2022-03-11: 50 mg via ORAL
  Filled 2022-03-11: qty 1

## 2022-03-11 MED ORDER — HYDROXYZINE HCL 25 MG PO TABS: 25.0000 mg | ORAL_TABLET | Freq: Four times a day (QID) | ORAL | Status: DC | PRN

## 2022-03-11 NOTE — ED Notes (Signed)
Pt given dinner.  He is talking with peer.    Complaining about his relationships but is redirectable  No further distress noted.

## 2022-03-11 NOTE — Progress Notes (Signed)
Received Howard Adams in the assessment room,he was irritable and demanded food upon introduction. He was relocated to the exam room for his EKG. The skin assessment was completed with security, Tillie Rung. His clothes was secured and he was escorted to the OBS area. He made a phone call and used profanity while talking on the phone. He was given nourishments per his choice. After he completed his phone call he continued with the loud talking and profanity at intervals.

## 2022-03-11 NOTE — Progress Notes (Signed)
Patient to transfer to [facility] after 0800 hours on 03/12/22.
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
## 2022-03-11 NOTE — ED Triage Notes (Signed)
Pt presents to [facility] escorted by [employer] for alcohol detox, he is currently intoxicated. He reports drinking a 40oz beer and a 16oz beer prior to coming to this facility. Pt denies SI/HI and AVH.
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
## 2022-03-11 NOTE — ED Notes (Signed)
Pt sitting up in bed. Talking to peers.  No distress noted.

## 2022-03-12 ENCOUNTER — Telehealth (HOSPITAL_COMMUNITY): Payer: Self-pay

## 2022-03-12 LAB — GC/CHLAMYDIA PROBE AMP (~~LOC~~) NOT AT ARMC
Chlamydia: NEGATIVE
Comment: NEGATIVE
Comment: NORMAL
Neisseria Gonorrhea: NEGATIVE

## 2022-03-12 LAB — RPR: RPR Ser Ql: NONREACTIVE

## 2022-03-12 NOTE — ED Provider Notes (Signed)
FBC/OBS ASAP Discharge Summary  Date and Time: 03/12/2022 9:03 AM  Name: Howard Adams  MRN:  [id number]   Discharge Diagnoses:  Final diagnoses:  Alcohol use disorder, severe, dependence (HCC)  Alcoholic intoxication without complication (HCC)   Subjective:   On reassessment, pt reports euthymic mood. He states he presented to this facility yesterday because "really needed to sober up". He is requesting discharge today. He states he is interested in detox and residential treatment, although not at this time. He believes he will be ready on January 1 of next year. He states this will symbolize a new start for him. He declined admission to [facility] when offered. He denies SI/VI/HI, AVH, paranoia. He reports he is not connected with psychiatric medication management or counseling. He is not connected with primary care. He is open to resources being provided. Discussed [facility] walk in hours and [facility] and Wellness. He states he will follow up with these resources and agrees to follow up on abnml lab values with primary care. He is also open to referral to [facility] program, provides best number for him as [phone], [email or web address]. He denies any physical complaints or withdrawal symptoms at this time. He denies hx of Dts or withdrawal seizures. Strict return cautions reviewed with pt, who verbalized understanding, and agrees to call 911/EMS, go to the nearest emergency room, or the nearest crisis center.   Stay Summary:  Pt is a 36 y/o male presenting to [facility] on 03/11/22 requesting detox. On reassessment, pt denies SI/VI/HI, AVH, paranoia. He states he is interested in detox and residential treatment, although not at this time. He declined admission to [facility] when offered. Pt discharged with resources.  Total Time spent with patient: 20 minutes  Past Psychiatric History: Polysubstance abuse Past Medical History:  Past Medical History:  Diagnosis Date   Asthma    No past  surgical history on file. Family History:  Family History  Problem Relation Age of Onset   Hypertension Father    Family Psychiatric History: Pt denied Social History:  Social History   Substance and Sexual Activity  Alcohol Use Yes   Alcohol/week: 4.0 standard drinks of alcohol   Types: 4 Cans of beer per week   Comment: 4-5 cans a week     Social History   Substance and Sexual Activity  Drug Use Yes   Types: Marijuana   Comment: 2 weeks ago    Social History   Socioeconomic History   Marital status: Significant Other    Spouse name: Not on file   Number of children: Not on file   Years of education: Not on file   Highest education level: Not on file  Occupational History   Not on file  Tobacco Use   Smoking status: Every Day    Packs/day: 0.50    Years: 10.00    Total pack years: 5.00    Types: Cigarettes   Smokeless tobacco: Never  Vaping Use   Vaping Use: Unknown  Substance and Sexual Activity   Alcohol use: Yes    Alcohol/week: 4.0 standard drinks of alcohol    Types: 4 Cans of beer per week    Comment: 4-5 cans a week   Drug use: Yes    Types: Marijuana    Comment: 2 weeks ago   Sexual activity: Yes    Birth control/protection: Condom  Other Topics Concern   Not on file  Social History Narrative   ** Merged History Encounter **  Social Determinants of Health   Financial Resource Strain: Unknown (03/28/2019)   Overall Financial Resource Strain (CARDIA)    Difficulty of Paying Living Expenses: Patient refused  Food Insecurity: Unknown (03/28/2019)   Hunger Vital Sign    Worried About Running Out of Food in the Last Year: Patient refused    Ran Out of Food in the Last Year: Patient refused  Transportation Needs: Unknown (03/28/2019)   PRAPARE - Transportation    Lack of Transportation (Medical): Patient refused    Lack of Transportation (Non-Medical): Patient refused  Physical Activity: Unknown (03/28/2019)   Exercise Vital Sign    Days of  Exercise per Week: Patient refused    Minutes of Exercise per Session: Patient refused  Stress: Stress Concern Present (03/28/2019)   [employer] of Occupational Health - Occupational Stress Questionnaire    Feeling of Stress : Very much  Social Connections: Unknown (03/28/2019)   Social Connection and Isolation Panel [NHANES]    Frequency of Communication with Friends and Family: Patient refused    Frequency of Social Gatherings with Friends and Family: Patient refused    Attends Religious Services: Patient refused    Active Member of Clubs or Organizations: Patient refused    Attends Banker Meetings: Patient refused    Marital Status: Patient refused   SDOH:  SDOH Screenings   Food Insecurity: Unknown (03/28/2019)  Transportation Needs: Unknown (03/28/2019)  Alcohol Screen: Medium Risk (07/12/2019)  Financial Resource Strain: Unknown (03/28/2019)  Physical Activity: Unknown (03/28/2019)  Social Connections: Unknown (03/28/2019)  Stress: Stress Concern Present (03/28/2019)  Tobacco Use: High Risk (08/16/2021)    Tobacco Cessation:  A prescription for an FDA-approved tobacco cessation medication was offered at discharge and the patient refused  Current Medications:  Current Facility-Administered Medications  Medication Dose Route Frequency Provider Last Rate Last Admin   acetaminophen (TYLENOL) tablet 650 mg  650 mg Oral Q6H PRN Ardis Hughs, NP       alum & mag hydroxide-simeth (MAALOX/MYLANTA) 200-200-20 MG/5ML suspension 30 mL  30 mL Oral Q4H PRN Ardis Hughs, NP       hydrOXYzine (ATARAX) tablet 25 mg  25 mg Oral Q6H PRN Ardis Hughs, NP       loperamide (IMODIUM) capsule 2-4 mg  2-4 mg Oral PRN Ardis Hughs, NP       LORazepam (ATIVAN) tablet 1 mg  1 mg Oral Q6H PRN Ardis Hughs, NP   1 mg at 03/11/22 2228   magnesium hydroxide (MILK OF MAGNESIA) suspension 30 mL  30 mL Oral Daily PRN Ardis Hughs, NP       multivitamin with  minerals tablet 1 tablet  1 tablet Oral Daily Ardis Hughs, NP   1 tablet at 03/11/22 1547   nicotine (NICODERM CQ - dosed in mg/24 hours) patch 21 mg  21 mg Transdermal Once Ardis Hughs, NP   21 mg at 03/11/22 1916   ondansetron (ZOFRAN-ODT) disintegrating tablet 4 mg  4 mg Oral Q6H PRN Ardis Hughs, NP       thiamine (VITAMIN B1) injection 100 mg  100 mg Intramuscular Once Ardis Hughs, NP       thiamine (VITAMIN B1) tablet 100 mg  100 mg Oral Daily Ardis Hughs, NP       traZODone (DESYREL) tablet 50 mg  50 mg Oral QHS PRN Ardis Hughs, NP   50 mg at 03/11/22 2228   No current outpatient medications on  file.    PTA Medications: (Not in a hospital admission)       No data to display          Flowsheet Row ED from 03/11/2022 in [facility] ED from 08/16/2021 in [facility] ED from 04/16/2021 in [facility] RISK CATEGORY No Risk No Risk Error: Question 6 not populated       Musculoskeletal  Strength & Muscle Tone: within normal limits Gait & Station: normal Patient leans: N/A  Psychiatric Specialty Exam  Presentation  General Appearance:  Appropriate for Environment  Eye Contact: Good  Speech: Clear and Coherent; Normal Rate  Speech Volume: Normal  Handedness: Right   Mood and Affect  Mood: Euthymic  Affect: Appropriate; Full Range   Thought Process  Thought Processes: Coherent; Goal Directed; Linear  Descriptions of Associations:Intact  Orientation:Full (Time, Place and Person)  Thought Content:Logical  Diagnosis of Schizophrenia or Schizoaffective disorder in past: No    Hallucinations:Hallucinations: None  Ideas of Reference:None  Suicidal Thoughts:Suicidal Thoughts: No  Homicidal Thoughts:Homicidal Thoughts: No   Sensorium  Memory: Immediate Good; Recent Good; Remote  Good  Judgment: Fair  Insight: Fair   Art therapist  Concentration: Good  Attention Span: Good  Recall: Good  Fund of Knowledge: Good  Language: Good   Psychomotor Activity  Psychomotor Activity: Psychomotor Activity: Normal   Assets  Assets: Communication Skills; Desire for Improvement; Financial Resources/Insurance; Physical Health; Resilience; Social Support   Sleep  Sleep: Sleep: Good Number of Hours of Sleep: 4   Nutritional Assessment (For OBS and [facility]BC admissions only) Has the patient had a weight loss or gain of 10 pounds or more in the last 3 months?: Yes Has the patient had a decrease in food intake/or appetite?: Yes Does the patient have dental problems?: No Does the patient have eating habits or behaviors that may be indicators of an eating disorder including binging or inducing vomiting?: No Has the patient recently lost weight without trying?: 2.0 Has the patient been eating poorly because of a decreased appetite?: 0 Malnutrition Screening Tool Score: 2    Physical Exam  Physical Exam Pulmonary:     Effort: Pulmonary effort is normal.  Neurological:     Mental Status: He is alert and oriented to person, place, and time.  Psychiatric:        Attention and Perception: Attention and perception normal.        Mood and Affect: Mood and affect normal.        Speech: Speech normal.        Behavior: Behavior normal. Behavior is cooperative.        Thought Content: Thought content normal.        Cognition and Memory: Cognition and memory normal.    Review of Systems  Constitutional:  Negative for chills and fever.  Respiratory:  Negative for shortness of breath.   Cardiovascular:  Negative for chest pain and palpitations.  Gastrointestinal:  Negative for abdominal pain, constipation, diarrhea, nausea and vomiting.  Neurological:  Negative for headaches.  Psychiatric/Behavioral:  Positive for substance abuse.    Blood pressure (!)  123/98, pulse (!) 110, temperature 98 F (36.7 C), temperature source Oral, resp. rate 18, SpO2 98 %. There is no height or weight on file to calculate BMI.  Demographic Factors:  Male  Loss Factors: Legal issues  Historical Factors: NA  Risk Reduction Factors:   Living with another  person, especially a relative  Continued Clinical Symptoms:  Alcohol/Substance Abuse/Dependencies Previous Psychiatric Diagnoses and Treatments  Cognitive Features That Contribute To Risk:  None    Suicide Risk:  Minimal: No identifiable suicidal ideation.  Patients presenting with no risk factors but with morbid ruminations; may be classified as minimal risk based on the severity of the depressive symptoms  Plan Of Care/Follow-up recommendations:  CDIOP referral Medication management and counseling Primary care  Disposition:  Discharge  Tharon Aquas, NP 03/12/2022, 9:03 AM

## 2022-03-12 NOTE — ED Notes (Signed)
Pt sleeping@this time. Breathing even and unlabored. Will continue to monitor for safety 

## 2022-03-12 NOTE — BH Assessment (Signed)
Care Management - [facility] Follow Up Discharges   Writer attempted to make contact with patient today and was unsuccessful.  Writer left a HIPPA compliant voice message.   Per chart review, patient was provided with substance abuse outpatient resources.  

## 2022-03-12 NOTE — Discharge Instructions (Addendum)
You have been referred to the [place] program. A staff member should be reaching out to you shortly.  Please come to [facility] (this facility, SECOND FLOOR) during walk in hours for appointment with psychiatrist/provider for further medication management and for therapists for therapy.   Walk in hours for therapy/counseling: Monday through Thursday 8AM until slots are full. Every Friday 1PM-4PM.  Walk in hours for psychiatry/medication management: Monday through Friday 8AM-11AM.   When you arrive please go upstairs for your appointment. If you are unsure of where to go, inform the front desk that you are here for a walk in appointment and they will assist you with directions upstairs.  Address:  [address] in [place], [place] Ph: [phone]

## 2022-03-19 ENCOUNTER — Other Ambulatory Visit (HOSPITAL_COMMUNITY)
Admission: EM | Admit: 2022-03-19 | Discharge: 2022-03-20 | Disposition: A | Discharge status: Home / Self Care | Payer: No Payment, Other | Attending: Psychiatry | Admitting: Psychiatry

## 2022-03-19 DIAGNOSIS — Z5900 Homelessness unspecified: Secondary | ICD-10-CM | POA: Insufficient documentation

## 2022-03-19 DIAGNOSIS — Z1152 Encounter for screening for COVID-19: Secondary | ICD-10-CM | POA: Insufficient documentation

## 2022-03-19 DIAGNOSIS — F1910 Other psychoactive substance abuse, uncomplicated: Secondary | ICD-10-CM | POA: Insufficient documentation

## 2022-03-19 DIAGNOSIS — F10129 Alcohol abuse with intoxication, unspecified: Secondary | ICD-10-CM | POA: Insufficient documentation

## 2022-03-19 DIAGNOSIS — F1010 Alcohol abuse, uncomplicated: Secondary | ICD-10-CM | POA: Diagnosis present

## 2022-03-19 LAB — POCT URINE DRUG SCREEN - MANUAL ENTRY (I-SCREEN)
POC Amphetamine UR: NOT DETECTED
POC Buprenorphine (BUP): NOT DETECTED
POC Cocaine UR: NOT DETECTED
POC Marijuana UR: POSITIVE — AB
POC Methadone UR: NOT DETECTED
POC Methamphetamine UR: NOT DETECTED
POC Morphine: NOT DETECTED
POC Oxazepam (BZO): NOT DETECTED
POC Oxycodone UR: NOT DETECTED
POC Secobarbital (BAR): NOT DETECTED

## 2022-03-19 LAB — RESP PANEL BY RT-PCR (FLU A&B, COVID) ARPGX2
Influenza A by PCR: NEGATIVE
Influenza B by PCR: NEGATIVE
SARS Coronavirus 2 by RT PCR: NEGATIVE

## 2022-03-19 LAB — POC SARS CORONAVIRUS 2 AG -  ED: SARS Coronavirus 2 Ag: NEGATIVE

## 2022-03-19 MED ORDER — THIAMINE MONONITRATE 100 MG PO TABS
100.0000 mg | ORAL_TABLET | Freq: Every day | ORAL | Status: DC
  Administered 2022-03-20: 100 mg via ORAL
  Filled 2022-03-19: qty 1

## 2022-03-19 MED ORDER — ONDANSETRON 4 MG PO TBDP: 4.0000 mg | ORAL_TABLET | Freq: Four times a day (QID) | ORAL | Status: DC | PRN

## 2022-03-19 MED ORDER — LORAZEPAM 1 MG PO TABS: 1.0000 mg | ORAL_TABLET | Freq: Four times a day (QID) | ORAL | Status: DC | PRN

## 2022-03-19 MED ORDER — LOPERAMIDE HCL 2 MG PO CAPS: 2.0000 mg | ORAL_CAPSULE | ORAL | Status: DC | PRN

## 2022-03-19 MED ORDER — ACETAMINOPHEN 325 MG PO TABS: 650.0000 mg | ORAL_TABLET | Freq: Four times a day (QID) | ORAL | Status: DC | PRN

## 2022-03-19 MED ORDER — HYDROXYZINE HCL 25 MG PO TABS
25.0000 mg | ORAL_TABLET | Freq: Four times a day (QID) | ORAL | Status: DC | PRN
  Filled 2022-03-19: qty 1

## 2022-03-19 MED ORDER — THIAMINE HCL 100 MG/ML IJ SOLN
100.0000 mg | Freq: Once | INTRAMUSCULAR | Status: AC
  Administered 2022-03-19: 100 mg via INTRAMUSCULAR
  Filled 2022-03-19: qty 2

## 2022-03-19 MED ORDER — ALUM & MAG HYDROXIDE-SIMETH 200-200-20 MG/5ML PO SUSP: 30.0000 mL | ORAL | Status: DC | PRN

## 2022-03-19 MED ORDER — ADULT MULTIVITAMIN W/MINERALS CH
1.0000 | ORAL_TABLET | Freq: Every day | ORAL | Status: DC
  Administered 2022-03-19 – 2022-03-20 (×2): 1 via ORAL
  Filled 2022-03-19 (×3): qty 1

## 2022-03-19 MED ORDER — MAGNESIUM HYDROXIDE 400 MG/5ML PO SUSP: 30.0000 mL | Freq: Every day | ORAL | Status: DC | PRN

## 2022-03-19 NOTE — ED Notes (Signed)
POC COVID NEGATIVE 

## 2022-03-19 NOTE — ED Notes (Signed)
[facility] called for transport of labs

## 2022-03-19 NOTE — ED Triage Notes (Signed)
Pt presents to [facility] voluntarily, unaccompanied at this time for alcohol detox. He reports drinking a 40oz beer and a 16oz beer prior to coming to this facility. Pt denies SI/HI and AVH.

## 2022-03-19 NOTE — ED Notes (Signed)
Lab called PCR COVID in progress

## 2022-03-19 NOTE — ED Notes (Signed)
Fluids encourage to gain urine specimen

## 2022-03-19 NOTE — ED Notes (Signed)
Pt unable to void at this time. 

## 2022-03-19 NOTE — ED Notes (Signed)
Pt admitted to [facility] requesting for alcohol detox.  Pt denies SI, HI, AVH at present. Patient was cooperative during the admission assessment. Skin assessment complete. Belongings in the locker. Patient oriented to unit and unit rules. Meal and drinks offered to patient.  Patient verbalized agreement to treatment plans. Patient verbally contracts for safety while hospitalized. Will monitor for safety.

## 2022-03-19 NOTE — ED Provider Notes (Signed)
Facility Based Crisis Admission H&P  Date: 03/20/22 Patient Name: Howard Adams MRN: [id number] Chief Complaint:  Chief Complaint  Patient presents with   Alcohol Problem      Diagnoses:  Final diagnoses:  Alcohol abuse with intoxication (HCC)  Homelessness  Substance abuse (HCC)    HPI: Howard BillMarquis Seckinger,  36 y.o male, with a history of alcohol abuse with intoxication, [facility], seeking detox from alcohol.  Per the patient he is trying to get detox so he can get into [facility].  According to patient he is currently homeless.  He was kicked out by his girlfriend.  According to the patient he drinks 40 ounce beer and a 16 ounce beer prior to coming in.  Patient was seen in this facility last week but [facility]  However patient stated he is willing to stay at this time. Patient denies seeing a psychiatrist or therapist at this time, denies taking any medication at this time.  Face-to-face observation of patient, patient is alert and oriented, speech is clear, maintained minimal eye contact.  Patient appearance seem to be under the influence of alcohol.  Patient denies SI, HI, AVH or paranoia.  Patient admitted to drinking alcohol prior to coming in. Pt has a history of marijuana and meth use.  Discuss with patient the need to comply with treatment,  pt agree to stay on the unit at this time.   Recommend FBC  PHQ 2-9:   Flowsheet Row [facility]  C-SSRS RISK CATEGORY No Risk No Risk No Risk        Total Time spent with patient: 20 minutes  Musculoskeletal  Strength & Muscle Tone: within normal limits Gait & Station: normal Patient leans: N/A  Psychiatric Specialty Exam  Presentation General Appearance:  Disheveled  Eye Contact: Fair  Speech: Clear and  Coherent  Speech Volume: Normal  Handedness: Right   Mood and Affect  Mood: Euthymic  Affect: Congruent   Thought Process  Thought Processes: Linear  Descriptions of Associations:Intact  Orientation:Full (Time, Place and Person)  Thought Content:WDL  Diagnosis of Schizophrenia or Schizoaffective disorder in past: No   Hallucinations:Hallucinations: None  Ideas of Reference:None  Suicidal Thoughts:Suicidal Thoughts: No  Homicidal Thoughts:Homicidal Thoughts: No   Sensorium  Memory: Immediate Fair  Judgment: Fair  Insight: Fair   Art therapistxecutive Functions  Concentration: Fair  Attention Span: Fair  Recall: Fair  Fund of Knowledge: Fair  Language: Fair   Psychomotor Activity  Psychomotor Activity: Psychomotor Activity: Normal   Assets  Assets: Desire for Improvement; Housing   Sleep  Sleep: Sleep: Fair Number of Hours of Sleep: 4   Nutritional Assessment (For OBS and FBC admissions only) Has the patient had a weight loss or gain of 10 pounds or more in the last 3 months?: No Has the patient had a decrease in food intake/or appetite?: No Does the patient have dental problems?: No Does the patient have eating habits or behaviors that may be indicators of an eating disorder including binging or inducing vomiting?: No Has the patient recently lost weight without trying?: 0 Has the patient been eating poorly because of a decreased appetite?: 0 Malnutrition Screening Tool Score: 0    Physical Exam HENT:     Head: Normocephalic.     Nose: Nose normal.  Cardiovascular:  Rate and Rhythm: Tachycardia present.  Pulmonary:     Effort: Pulmonary effort is normal.  Musculoskeletal:        General: Normal range of motion.     Cervical back: Normal range of motion.  Neurological:     General: No focal deficit present.     Mental Status: He is alert.  Psychiatric:        Mood and Affect: Mood normal.        Behavior: Behavior  normal.        Thought Content: Thought content normal.        Judgment: Judgment normal.    Review of Systems  Constitutional: Negative.   HENT: Negative.    Eyes: Negative.   Respiratory: Negative.    Cardiovascular: Negative.   Gastrointestinal: Negative.   Genitourinary: Negative.   Musculoskeletal: Negative.   Skin: Negative.   Neurological: Negative.   Endo/Heme/Allergies: Negative.   Psychiatric/Behavioral:  Positive for substance abuse. The patient is nervous/anxious.     Blood pressure (!) 138/93, pulse (!) 110, temperature 98 F (36.7 C), temperature source Oral, resp. rate 20, SpO2 99 %. There is no height or weight on file to calculate BMI.  Past Psychiatric History: Alcohol abuse  Is the patient at risk to self? No  Has the patient been a risk to self in the past 6 months? No .    Has the patient been a risk to self within the distant past? No   Is the patient a risk to others? No   Has the patient been a risk to others in the past 6 months? No   Has the patient been a risk to others within the distant past? No   Past Medical History:  Past Medical History:  Diagnosis Date   Asthma    No past surgical history on file.  Family History:  Family History  Problem Relation Age of Onset   Hypertension Father     Social History:  Social History   Socioeconomic History   Marital status: Significant Other    Spouse name: Not on file   Number of children: Not on file   Years of education: Not on file   Highest education level: Not on file  Occupational History   Not on file  Tobacco Use   Smoking status: Every Day    Packs/day: 0.50    Years: 10.00    Total pack years: 5.00    Types: Cigarettes   Smokeless tobacco: Never  Vaping Use   Vaping Use: Unknown  Substance and Sexual Activity   Alcohol use: Yes    Alcohol/week: 4.0 standard drinks of alcohol    Types: 4 Cans of beer per week    Comment: 4-5 cans a week   Drug use: Yes    Types:  Marijuana    Comment: 2 weeks ago   Sexual activity: Yes    Birth control/protection: Condom  Other Topics Concern   Not on file  Social History Narrative   ** Merged History Encounter **       Social Determinants of Health   Financial Resource Strain: Unknown (03/28/2019)   Overall Financial Resource Strain (CARDIA)    Difficulty of Paying Living Expenses: Patient refused  Food Insecurity: Unknown (03/28/2019)   Hunger Vital Sign    Worried About Running Out of Food in the Last Year: Patient refused    Ran Out of Food in the Last Year: Patient refused  Transportation Needs: Unknown (03/28/2019)  PRAPARE - Administrator, Civil Service (Medical): Patient refused    Lack of Transportation (Non-Medical): Patient refused  Physical Activity: Unknown (03/28/2019)   Exercise Vital Sign    Days of Exercise per Week: Patient refused    Minutes of Exercise per Session: Patient refused  Stress: Stress Concern Present (03/28/2019)   [employer] of Occupational Health - Occupational Stress Questionnaire    Feeling of Stress : Very much  Social Connections: Unknown (03/28/2019)   Social Connection and Isolation Panel [NHANES]    Frequency of Communication with Friends and Family: Patient refused    Frequency of Social Gatherings with Friends and Family: Patient refused    Attends Religious Services: Patient refused    Active Member of Clubs or Organizations: Patient refused    Attends Banker Meetings: Patient refused    Marital Status: Patient refused  Intimate Partner Violence: Unknown (03/28/2019)   Humiliation, Afraid, Rape, and Kick questionnaire    Fear of Current or Ex-Partner: Patient refused    Emotionally Abused: Patient refused    Physically Abused: Patient refused    Sexually Abused: Patient refused    SDOH:  SDOH Screenings   Food Insecurity: Unknown (03/28/2019)  Transportation Needs: Unknown (03/28/2019)  Alcohol Screen: Medium Risk  (07/12/2019)  Financial Resource Strain: Unknown (03/28/2019)  Physical Activity: Unknown (03/28/2019)  Social Connections: Unknown (03/28/2019)  Stress: Stress Concern Present (03/28/2019)  Tobacco Use: High Risk (08/16/2021)    Last Labs:  Admission on 03/19/2022  Component Date Value Ref Range Status   SARS Coronavirus 2 by RT PCR 03/19/2022 NEGATIVE  NEGATIVE Final   Comment: (NOTE) SARS-CoV-2 target nucleic acids are NOT DETECTED.  The SARS-CoV-2 RNA is generally detectable in upper respiratory specimens during the acute phase of infection. The lowest concentration of SARS-CoV-2 viral copies this assay can detect is 138 copies/mL. A negative result does not preclude SARS-Cov-2 infection and should not be used as the sole basis for treatment or other patient management decisions. A negative result may occur with  improper specimen collection/handling, submission of specimen other than nasopharyngeal swab, presence of viral mutation(s) within the areas targeted by this assay, and inadequate number of viral copies(<138 copies/mL). A negative result must be combined with clinical observations, patient history, and epidemiological information. The expected result is Negative.  Fact Sheet for Patients:  BloggerCourse.com  Fact Sheet for Healthcare Providers:  SeriousBroker.it  This test is no                          t yet approved or cleared by the [place] FDA and  has been authorized for detection and/or diagnosis of SARS-CoV-2 by FDA under an Emergency Use Authorization (EUA). This EUA will remain  in effect (meaning this test can be used) for the duration of the COVID-19 declaration under Section 564(b)(1) of the Act, 21 U.S.C.section 360bbb-3(b)(1), unless the authorization is terminated  or revoked sooner.       Influenza A by PCR 03/19/2022 NEGATIVE  NEGATIVE Final   Influenza B by PCR 03/19/2022 NEGATIVE  NEGATIVE  Final   Comment: (NOTE) The Xpert Xpress SARS-CoV-2/FLU/RSV plus assay is intended as an aid in the diagnosis of influenza from Nasopharyngeal swab specimens and should not be used as a sole basis for treatment. Nasal washings and aspirates are unacceptable for Xpert Xpress SARS-CoV-2/FLU/RSV testing.  Fact Sheet for Patients: BloggerCourse.com  Fact Sheet for Healthcare Providers: SeriousBroker.it  This test is  not yet approved or cleared by the [place] and has been authorized for detection and/or diagnosis of SARS-CoV-2 by FDA under an Emergency Use Authorization (EUA). This EUA will remain in effect (meaning this test can be used) for the duration of the COVID-19 declaration under Section 564(b)(1) of the Act, 21 U.S.C. section 360bbb-3(b)(1), unless the authorization is terminated or revoked.  Performed at [facility], [address] [place], [place] [address]    Alcohol, Ethyl (B) 03/19/2022 318 (HH)  <10 mg/dL Final   Comment: CRITICAL RESULT CALLED TO, READ BACK BY AND VERIFIED WITH BOBBY BROOKS RN 03/20/22 0058 M KOROLESKI (NOTE) Lowest detectable limit for serum alcohol is 10 mg/dL.  For medical purposes only. Performed at [facility], [address], [place], [place] [address]    WBC 03/19/2022 3.9 (L)  4.0 - 10.5 K/uL Final   RBC 03/19/2022 4.48  4.22 - 5.81 MIL/uL Final   Hemoglobin 03/19/2022 14.9  13.0 - 17.0 g/dL Final   HCT 47/82/9562 42.9  39.0 - 52.0 % Final   MCV 03/19/2022 95.8  80.0 - 100.0 fL Final   MCH 03/19/2022 33.3  26.0 - 34.0 pg Final   MCHC 03/19/2022 34.7  30.0 - 36.0 g/dL Final   RDW 13/12/6576 13.1  11.5 - 15.5 % Final   Platelets 03/19/2022 183  150 - 400 K/uL Final   nRBC 03/19/2022 0.0  0.0 - 0.2 % Final   Neutrophils Relative % 03/19/2022 42  % Final   Neutro Abs 03/19/2022 1.6 (L)  1.7 - 7.7 K/uL Final   Lymphocytes Relative 03/19/2022 47  % Final   Lymphs Abs  03/19/2022 1.8  0.7 - 4.0 K/uL Final   Monocytes Relative 03/19/2022 9  % Final   Monocytes Absolute 03/19/2022 0.4  0.1 - 1.0 K/uL Final   Eosinophils Relative 03/19/2022 1  % Final   Eosinophils Absolute 03/19/2022 0.1  0.0 - 0.5 K/uL Final   Basophils Relative 03/19/2022 1  % Final   Basophils Absolute 03/19/2022 0.0  0.0 - 0.1 K/uL Final   Immature Granulocytes 03/19/2022 0  % Final   Abs Immature Granulocytes 03/19/2022 0.01  0.00 - 0.07 K/uL Final   Performed at [facility], [address], [place], [place] [address]   Sodium 03/19/2022 145  135 - 145 mmol/L Final   Potassium 03/19/2022 3.9  3.5 - 5.1 mmol/L Final   Chloride 03/19/2022 113 (H)  98 - 111 mmol/L Final   CO2 03/19/2022 23  22 - 32 mmol/L Final   Glucose, Bld 03/19/2022 77  70 - 99 mg/dL Final   Glucose reference range applies only to samples taken after fasting for at least 8 hours.   BUN 03/19/2022 7  6 - 20 mg/dL Final   Creatinine, Ser 03/19/2022 0.85  0.61 - 1.24 mg/dL Final   Calcium 95/28/4132 8.2 (L)  8.9 - 10.3 mg/dL Final   Total Protein 44/05/270 4.9 (L)  6.5 - 8.1 g/dL Final   Albumin 53/66/4403 2.6 (L)  3.5 - 5.0 g/dL Final   AST 47/42/5956 48 (H)  15 - 41 U/L Final   ALT 03/19/2022 56 (H)  0 - 44 U/L Final   Alkaline Phosphatase 03/19/2022 53  38 - 126 U/L Final   Total Bilirubin 03/19/2022 0.2 (L)  0.3 - 1.2 mg/dL Final   GFR, Estimated 03/19/2022 >60  >60 mL/min Final   Comment: (NOTE) Calculated using the CKD-EPI Creatinine Equation (2021)  Anion gap 03/19/2022 9  5 - 15 Final   Performed at [facility], [address] [place], [place] [address]   TSH 03/19/2022 0.734  0.350 - 4.500 uIU/mL Final   Comment: Performed by a 3rd Generation assay with a functional sensitivity of <=0.01 uIU/mL. Performed at [facility], [address], [place], [place] [address]    POC Amphetamine UR 03/19/2022 None Detected  NONE DETECTED (Cut Off Level 1000 ng/mL) Final   POC Secobarbital  (BAR) 03/19/2022 None Detected  NONE DETECTED (Cut Off Level 300 ng/mL) Final   POC Buprenorphine (BUP) 03/19/2022 None Detected  NONE DETECTED (Cut Off Level 10 ng/mL) Final   POC Oxazepam (BZO) 03/19/2022 None Detected  NONE DETECTED (Cut Off Level 300 ng/mL) Final   POC Cocaine UR 03/19/2022 None Detected  NONE DETECTED (Cut Off Level 300 ng/mL) Final   POC Methamphetamine UR 03/19/2022 None Detected  NONE DETECTED (Cut Off Level 1000 ng/mL) Final   POC Morphine 03/19/2022 None Detected  NONE DETECTED (Cut Off Level 300 ng/mL) Final   POC Methadone UR 03/19/2022 None Detected  NONE DETECTED (Cut Off Level 300 ng/mL) Final   POC Oxycodone UR 03/19/2022 None Detected  NONE DETECTED (Cut Off Level 100 ng/mL) Final   POC Marijuana UR 03/19/2022 Positive (A)  NONE DETECTED (Cut Off Level 50 ng/mL) Final   SARSCOV2ONAVIRUS 2 AG 03/20/2022 NEGATIVE  NEGATIVE Final   Comment: (NOTE) SARS-CoV-2 antigen NOT DETECTED.   Negative results are presumptive.  Negative results do not preclude SARS-CoV-2 infection and should not be used as the sole basis for treatment or other patient management decisions, including infection  control decisions, particularly in the presence of clinical signs and  symptoms consistent with COVID-19, or in those who have been in contact with the virus.  Negative results must be combined with clinical observations, patient history, and epidemiological information. The expected result is Negative.  Fact Sheet for Patients: https://www.jennings-kim.com/  Fact Sheet for Healthcare Providers: https://alexander-rogers.biz/  This test is not yet approved or cleared by the [place] FDA and  has been authorized for detection and/or diagnosis of SARS-CoV-2 by FDA under an Emergency Use Authorization (EUA).  This EUA will remain in effect (meaning this test can be used) for the duration of  the COV                          ID-19 declaration under  Section 564(b)(1) of the Act, 21 U.S.C. section 360bbb-3(b)(1), unless the authorization is terminated or revoked sooner.    Admission on 03/11/2022, Discharged on 03/12/2022  Component Date Value Ref Range Status   SARS Coronavirus 2 by RT PCR 03/11/2022 NEGATIVE  NEGATIVE Final   Comment: (NOTE) SARS-CoV-2 target nucleic acids are NOT DETECTED.  The SARS-CoV-2 RNA is generally detectable in upper respiratory specimens during the acute phase of infection. The lowest concentration of SARS-CoV-2 viral copies this assay can detect is 138 copies/mL. A negative result does not preclude SARS-Cov-2 infection and should not be used as the sole basis for treatment or other patient management decisions. A negative result may occur with  improper specimen collection/handling, submission of specimen other than nasopharyngeal swab, presence of viral mutation(s) within the areas targeted by this assay, and inadequate number of viral copies(<138 copies/mL). A negative result must be combined with clinical observations, patient history, and epidemiological information. The expected result is Negative.  Fact Sheet for Patients:  BloggerCourse.com  Fact Sheet for Healthcare Providers:  SeriousBroker.it  This test is no                          t yet approved or cleared by the [place] FDA and  has been authorized for detection and/or diagnosis of SARS-CoV-2 by FDA under an Emergency Use Authorization (EUA). This EUA will remain  in effect (meaning this test can be used) for the duration of the COVID-19 declaration under Section 564(b)(1) of the Act, 21 U.S.C.section 360bbb-3(b)(1), unless the authorization is terminated  or revoked sooner.       Influenza A by PCR 03/11/2022 NEGATIVE  NEGATIVE Final   Influenza B by PCR 03/11/2022 NEGATIVE  NEGATIVE Final   Comment: (NOTE) The Xpert Xpress SARS-CoV-2/FLU/RSV plus assay is intended as an  aid in the diagnosis of influenza from Nasopharyngeal swab specimens and should not be used as a sole basis for treatment. Nasal washings and aspirates are unacceptable for Xpert Xpress SARS-CoV-2/FLU/RSV testing.  Fact Sheet for Patients: BloggerCourse.com  Fact Sheet for Healthcare Providers: SeriousBroker.it  This test is not yet approved or cleared by the [place] FDA and has been authorized for detection and/or diagnosis of SARS-CoV-2 by FDA under an Emergency Use Authorization (EUA). This EUA will remain in effect (meaning this test can be used) for the duration of the COVID-19 declaration under Section 564(b)(1) of the Act, 21 U.S.C. section 360bbb-3(b)(1), unless the authorization is terminated or revoked.  Performed at [facility], [address] [place], [place] [address]    WBC 03/11/2022 5.4  4.0 - 10.5 K/uL Final   RBC 03/11/2022 4.53  4.22 - 5.81 MIL/uL Final   Hemoglobin 03/11/2022 15.5  13.0 - 17.0 g/dL Final   HCT 49/67/5916 42.9  39.0 - 52.0 % Final   MCV 03/11/2022 94.7  80.0 - 100.0 fL Final   MCH 03/11/2022 34.2 (H)  26.0 - 34.0 pg Final   MCHC 03/11/2022 36.1 (H)  30.0 - 36.0 g/dL Final   RDW 38/46/6599 12.9  11.5 - 15.5 % Final   Platelets 03/11/2022 277  150 - 400 K/uL Final   nRBC 03/11/2022 0.0  0.0 - 0.2 % Final   Neutrophils Relative % 03/11/2022 49  % Final   Neutro Abs 03/11/2022 2.7  1.7 - 7.7 K/uL Final   Lymphocytes Relative 03/11/2022 41  % Final   Lymphs Abs 03/11/2022 2.2  0.7 - 4.0 K/uL Final   Monocytes Relative 03/11/2022 7  % Final   Monocytes Absolute 03/11/2022 0.4  0.1 - 1.0 K/uL Final   Eosinophils Relative 03/11/2022 2  % Final   Eosinophils Absolute 03/11/2022 0.1  0.0 - 0.5 K/uL Final   Basophils Relative 03/11/2022 1  % Final   Basophils Absolute 03/11/2022 0.0  0.0 - 0.1 K/uL Final   Immature Granulocytes 03/11/2022 0  % Final   Abs Immature Granulocytes  03/11/2022 0.02  0.00 - 0.07 K/uL Final   Performed at [facility], [address] [place], [place] [address]   Sodium 03/11/2022 144  135 - 145 mmol/L Final   Potassium 03/11/2022 3.7  3.5 - 5.1 mmol/L Final   Chloride 03/11/2022 113 (H)  98 - 111 mmol/L Final   CO2 03/11/2022 21 (L)  22 - 32 mmol/L Final   Glucose, Bld 03/11/2022 99  70 - 99 mg/dL Final   Glucose reference range applies only to samples taken after fasting for  at least 8 hours.   BUN 03/11/2022 10  6 - 20 mg/dL Final   Creatinine, Ser 03/11/2022 0.91  0.61 - 1.24 mg/dL Final   Calcium 16/02/9603 8.6 (L)  8.9 - 10.3 mg/dL Final   Total Protein 54/01/8118 5.9 (L)  6.5 - 8.1 g/dL Final   Albumin 14/78/2956 3.2 (L)  3.5 - 5.0 g/dL Final   AST 21/30/8657 27  15 - 41 U/L Final   ALT 03/11/2022 44  0 - 44 U/L Final   Alkaline Phosphatase 03/11/2022 56  38 - 126 U/L Final   Total Bilirubin 03/11/2022 0.3  0.3 - 1.2 mg/dL Final   GFR, Estimated 03/11/2022 >60  >60 mL/min Final   Comment: (NOTE) Calculated using the CKD-EPI Creatinine Equation (2021)    Anion gap 03/11/2022 10  5 - 15 Final   Performed at [facility], [address] [place], [place] [address]   Hgb A1c MFr Bld 03/11/2022 4.9  4.8 - 5.6 % Final   Comment: (NOTE) Pre diabetes:          5.7%-6.4%  Diabetes:              >6.4%  Glycemic control for   <7.0% adults with diabetes    Mean Plasma Glucose 03/11/2022 93.93  mg/dL Final   Performed at [facility], [address] [place], [place] [address]   Magnesium 03/11/2022 2.3  1.7 - 2.4 mg/dL Final   Performed at [facility], [address] [place], [place] [address]   Alcohol, Ethyl (B) 03/11/2022 269 (H)  <10 mg/dL Final   Comment: (NOTE) Lowest detectable limit for serum alcohol is 10 mg/dL.  For medical purposes only. Performed at [facility], [address], [place], [place] [address]    Cholesterol 03/11/2022 157  0 - 200 mg/dL Final   Triglycerides  03/11/2022 548 (H)  <150 mg/dL Final   HDL 72/53/6644 60  >40 mg/dL Final   Total CHOL/HDL Ratio 03/11/2022 2.6  RATIO Final   VLDL 03/11/2022 UNABLE TO CALCULATE IF TRIGLYCERIDE OVER 400 mg/dL  0 - 40 mg/dL Final   LDL Cholesterol 03/11/2022 UNABLE TO CALCULATE IF TRIGLYCERIDE OVER 400 mg/dL  0 - 99 mg/dL Final   Comment:        Total Cholesterol/HDL:CHD Risk Coronary Heart Disease Risk Table                     Men   Women  1/2 Average Risk   3.4   3.3  Average Risk       5.0   4.4  2 X Average Risk   9.6   7.1  3 X Average Risk  23.4   11.0        Use the calculated Patient Ratio above and the CHD Risk Table to determine the patient's CHD Risk.        ATP III CLASSIFICATION (LDL):  <100     mg/dL   Optimal  034-742  mg/dL   Near or Above                    Optimal  130-159  mg/dL   Borderline  595-638  mg/dL   High  >756     mg/dL   Very High Performed at [facility], [address], [place], [place] [address]    TSH 03/11/2022 1.019  0.350 - 4.500 uIU/mL Final  Comment: Performed by a 3rd Generation assay with a functional sensitivity of <=0.01 uIU/mL. Performed at [facility], [address] [place], [place] [address]    RPR Ser Ql 03/11/2022 NON REACTIVE  NON REACTIVE Final   Performed at [facility], [address], [place], [place] [address]   Chlamydia 03/11/2022 Negative   Final   Neisseria Gonorrhea 03/11/2022 Negative   Final   Comment 03/11/2022 Normal Reference Ranger Chlamydia - Negative   Final   Comment 03/11/2022 Normal Reference Range Neisseria Gonorrhea - Negative   Final   POC Amphetamine UR 03/11/2022 None Detected  NONE DETECTED (Cut Off Level 1000 ng/mL) Final   POC Secobarbital (BAR) 03/11/2022 None Detected  NONE DETECTED (Cut Off Level 300 ng/mL) Final   POC Buprenorphine (BUP) 03/11/2022 None Detected  NONE DETECTED (Cut Off Level 10 ng/mL) Final   POC Oxazepam (BZO) 03/11/2022 None Detected  NONE DETECTED (Cut Off Level 300  ng/mL) Final   POC Cocaine UR 03/11/2022 None Detected  NONE DETECTED (Cut Off Level 300 ng/mL) Final   POC Methamphetamine UR 03/11/2022 Positive (A)  NONE DETECTED (Cut Off Level 1000 ng/mL) Final   POC Morphine 03/11/2022 None Detected  NONE DETECTED (Cut Off Level 300 ng/mL) Final   POC Methadone UR 03/11/2022 None Detected  NONE DETECTED (Cut Off Level 300 ng/mL) Final   POC Oxycodone UR 03/11/2022 None Detected  NONE DETECTED (Cut Off Level 100 ng/mL) Final   POC Marijuana UR 03/11/2022 Positive (A)  NONE DETECTED (Cut Off Level 50 ng/mL) Final   SARS Coronavirus 2 Ag 03/11/2022 Negative  Negative Final   Direct LDL 03/11/2022 49  0 - 99 mg/dL Final   Performed at [facility], [address] [place], [place] [address]    Allergies: Patient has no known allergies.  PTA Medications: (Not in a hospital admission)   Long Term Goals: [facility]  Short Term Goals: Patient will verbalize feelings in meetings with treatment team members., Patient will attend at least of 50% of the groups daily., Pt will complete the PHQ9 on admission, day 3 and discharge., and Patient will participate in completing the [place] Suicide Severity Rating Scale  Medical Decision Making  Inpatient [facility] Lab Orders         Resp Panel by RT-PCR (Flu A&B, Covid) Anterior Nasal Swab         Ethanol         CBC with Differential/Platelet         Comprehensive metabolic panel         TSH         POCT Urine Drug Screen - (I-Screen)         POC SARS Coronavirus 2 Ag      Meds ordered this encounter  Medications   acetaminophen (TYLENOL) tablet 650 mg   alum & mag hydroxide-simeth (MAALOX/MYLANTA) 200-200-20 MG/5ML suspension 30 mL   magnesium hydroxide (MILK OF MAGNESIA) suspension 30 mL   thiamine (VITAMIN B1) injection 100 mg   thiamine (VITAMIN B1) tablet 100 mg   multivitamin with minerals tablet 1 tablet   LORazepam (ATIVAN) tablet 1 mg   hydrOXYzine (ATARAX) tablet 25 mg   loperamide  (IMODIUM) capsule 2-4 mg   ondansetron (ZOFRAN-ODT) disintegrating tablet 4 mg      Recommendations  Based on my evaluation the patient appears to have an emergency medical condition for which I recommend the patient be transferred to the emergency  department for further evaluation.  Evette Georges, NP 03/20/22  5:41 AM

## 2022-03-19 NOTE — ED Notes (Signed)
Pt arrived [facility] 

## 2022-03-19 NOTE — ED Notes (Signed)
Pt is in the bed sleeping. Respirations are even and unlabored. No acute distress noted. Will continue to monitor for safety. 
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
## 2022-03-19 NOTE — ED Notes (Signed)
Pt was offered food and drinks

## 2022-03-20 ENCOUNTER — Encounter (HOSPITAL_COMMUNITY): Payer: Self-pay

## 2022-03-20 DIAGNOSIS — F10129 Alcohol abuse with intoxication, unspecified: Secondary | ICD-10-CM

## 2022-03-20 DIAGNOSIS — Z5900 Homelessness unspecified: Secondary | ICD-10-CM | POA: Diagnosis not present

## 2022-03-20 DIAGNOSIS — Z1152 Encounter for screening for COVID-19: Secondary | ICD-10-CM | POA: Diagnosis not present

## 2022-03-20 DIAGNOSIS — F1910 Other psychoactive substance abuse, uncomplicated: Secondary | ICD-10-CM | POA: Diagnosis not present

## 2022-03-20 LAB — CBC WITH DIFFERENTIAL/PLATELET
Abs Immature Granulocytes: 0.01 10*3/uL (ref 0.00–0.07)
Basophils Absolute: 0 10*3/uL (ref 0.0–0.1)
Basophils Relative: 1 %
Eosinophils Absolute: 0.1 10*3/uL (ref 0.0–0.5)
Eosinophils Relative: 1 %
HCT: 42.9 % (ref 39.0–52.0)
Hemoglobin: 14.9 g/dL (ref 13.0–17.0)
Immature Granulocytes: 0 %
Lymphocytes Relative: 47 %
Lymphs Abs: 1.8 10*3/uL (ref 0.7–4.0)
MCH: 33.3 pg (ref 26.0–34.0)
MCHC: 34.7 g/dL (ref 30.0–36.0)
MCV: 95.8 fL (ref 80.0–100.0)
Monocytes Absolute: 0.4 10*3/uL (ref 0.1–1.0)
Monocytes Relative: 9 %
Neutro Abs: 1.6 10*3/uL — ABNORMAL LOW (ref 1.7–7.7)
Neutrophils Relative %: 42 %
Platelets: 183 10*3/uL (ref 150–400)
RBC: 4.48 MIL/uL (ref 4.22–5.81)
RDW: 13.1 % (ref 11.5–15.5)
WBC: 3.9 10*3/uL — ABNORMAL LOW (ref 4.0–10.5)
nRBC: 0 % (ref 0.0–0.2)

## 2022-03-20 LAB — COMPREHENSIVE METABOLIC PANEL
ALT: 56 U/L — ABNORMAL HIGH (ref 0–44)
AST: 48 U/L — ABNORMAL HIGH (ref 15–41)
Albumin: 2.6 g/dL — ABNORMAL LOW (ref 3.5–5.0)
Alkaline Phosphatase: 53 U/L (ref 38–126)
Anion gap: 9 (ref 5–15)
BUN: 7 mg/dL (ref 6–20)
CO2: 23 mmol/L (ref 22–32)
Calcium: 8.2 mg/dL — ABNORMAL LOW (ref 8.9–10.3)
Chloride: 113 mmol/L — ABNORMAL HIGH (ref 98–111)
Creatinine, Ser: 0.85 mg/dL (ref 0.61–1.24)
GFR, Estimated: >60 mL/min (ref >60)
Glucose, Bld: 77 mg/dL (ref 70–99)
Potassium: 3.9 mmol/L (ref 3.5–5.1)
Sodium: 145 mmol/L (ref 135–145)
Total Bilirubin: 0.2 mg/dL — ABNORMAL LOW (ref 0.3–1.2)
Total Protein: 4.9 g/dL — ABNORMAL LOW (ref 6.5–8.1)

## 2022-03-20 LAB — POC SARS CORONAVIRUS 2 AG: SARSCOV2ONAVIRUS 2 AG: NEGATIVE

## 2022-03-20 LAB — ETHANOL: Alcohol, Ethyl (B): 318 mg/dL (ref <10)

## 2022-03-20 LAB — TSH: TSH: 0.734 u[IU]/mL (ref 0.350–4.500)

## 2022-03-20 MED ORDER — THIAMINE MONONITRATE 100 MG PO TABS
100.0000 mg | ORAL_TABLET | Freq: Every day | ORAL | Status: DC
  Filled 2022-03-20: qty 1

## 2022-03-20 MED ORDER — ADULT MULTIVITAMIN W/MINERALS CH
1.0000 | ORAL_TABLET | Freq: Every day | ORAL | Status: DC
  Filled 2022-03-20: qty 1

## 2022-03-20 MED ORDER — CHLORDIAZEPOXIDE HCL 25 MG PO CAPS: 25.0000 mg | ORAL_CAPSULE | Freq: Every day | ORAL | Status: DC

## 2022-03-20 MED ORDER — CHLORDIAZEPOXIDE HCL 25 MG PO CAPS
25.0000 mg | ORAL_CAPSULE | Freq: Four times a day (QID) | ORAL | Status: DC
  Administered 2022-03-20 (×2): 25 mg via ORAL
  Filled 2022-03-20 (×4): qty 1

## 2022-03-20 MED ORDER — CHLORDIAZEPOXIDE HCL 25 MG PO CAPS: 25.0000 mg | ORAL_CAPSULE | ORAL | Status: DC

## 2022-03-20 MED ORDER — CHLORDIAZEPOXIDE HCL 25 MG PO CAPS: 25.0000 mg | ORAL_CAPSULE | Freq: Three times a day (TID) | ORAL | Status: DC

## 2022-03-20 NOTE — ED Provider Notes (Signed)
Behavioral Health Progress Note  Date and Time: 03/20/2022 12:08 PM Name: Howard Adams MRN:  [id number]  Subjective:   Lorenso Ellman is a 36 year old male with a history of alcohol use disorder and two prior behavioral health admissions both which involved the patient making suicidal statements. He presented to the [facility] urgent care and was admitted to the facility based crisis. He is currently requesting placement in residential rehab. He is currently homeless, has no source of income, and no insurance. He reports experiencing alcohol withdrawal symptoms previously and has been started on a Librium taper. He has been accepted to [facility] for Monday.   The patient denies auditory/visual hallucinations and first rank symptoms.  The patient reports good mood, appetite, and sleep. They deny suicidal and homicidal thoughts. The patient denies side effects from their medications.  Review of systems as below. The patient denies experiencing any withdrawal symptoms.    Diagnosis:  Final diagnoses:  Alcohol abuse with intoxication ([place])  Homelessness  Substance abuse ([place])    Total Time spent with patient: 30 minutes  Past Psychiatric History: as above Past Medical History:  Past Medical History:  Diagnosis Date   Asthma    No past surgical history on file. Family History:  Family History  Problem Relation Age of Onset   Hypertension Father    Family Psychiatric  History: per H and P Social History:  Social History   Substance and Sexual Activity  Alcohol Use Yes   Alcohol/week: 4.0 standard drinks of alcohol   Types: 4 Cans of beer per week   Comment: 4-5 cans a week     Social History   Substance and Sexual Activity  Drug Use Yes   Types: Marijuana   Comment: 2 weeks ago    Social History   Socioeconomic History   Marital status: Significant Other    Spouse name: Not on file   Number of children: Not on file   Years of education: Not on file    Highest education level: Not on file  Occupational History   Not on file  Tobacco Use   Smoking status: Every Day    Packs/day: 0.50    Years: 10.00    Total pack years: 5.00    Types: Cigarettes   Smokeless tobacco: Never  Vaping Use   Vaping Use: Unknown  Substance and Sexual Activity   Alcohol use: Yes    Alcohol/week: 4.0 standard drinks of alcohol    Types: 4 Cans of beer per week    Comment: 4-5 cans a week   Drug use: Yes    Types: Marijuana    Comment: 2 weeks ago   Sexual activity: Yes    Birth control/protection: Condom  Other Topics Concern   Not on file  Social History Narrative   ** Merged History Encounter **       Social Determinants of Health   Financial Resource Strain: Unknown (03/28/2019)   Overall Financial Resource Strain (CARDIA)    Difficulty of Paying Living Expenses: Patient refused  Food Insecurity: Unknown (03/28/2019)   Hunger Vital Sign    Worried About Running Out of Food in the Last Year: Patient refused    [place] in the Last Year: Patient refused  Transportation Needs: Unknown (03/28/2019)   PRAPARE - Transportation    Lack of Transportation (Medical): Patient refused    Lack of Transportation (Non-Medical): Patient refused  Physical Activity: Unknown (03/28/2019)   Exercise Vital  Sign    Days of Exercise per Week: Patient refused    Minutes of Exercise per Session: Patient refused  Stress: Stress Concern Present (03/28/2019)   [place]    Feeling of Stress : Very much  Social Connections: Unknown (03/28/2019)   Social Connection and Isolation Panel [NHANES]    Frequency of Communication with Friends and Family: Patient refused    Frequency of Social Gatherings with Friends and Family: Patient refused    Attends Religious Services: Patient refused    Active Member of Clubs or Organizations: Patient refused    Attends Archivist Meetings: Patient  refused    Marital Status: Patient refused   SDOH:  SDOH Screenings   Food Insecurity: Unknown (03/28/2019)  Transportation Needs: Unknown (03/28/2019)  Alcohol Screen: Medium Risk (07/12/2019)  Financial Resource Strain: Unknown (03/28/2019)  Physical Activity: Unknown (03/28/2019)  Social Connections: Unknown (03/28/2019)  Stress: Stress Concern Present (03/28/2019)  Tobacco Use: High Risk (08/16/2021)   Additional Social History:    Pain Medications: Please see MAR Prescriptions: Please see MAR Over the Counter: Please see MAR History of alcohol / drug use?: Yes Longest period of sobriety (when/how long): 90 days (incarcerated during that time frame) Negative Consequences of Use: Personal relationships, Legal, Financial Withdrawal Symptoms: Patient aware of relationship between substance abuse and physical/medical complications, Agitation, Irritability, Aggressive/Assaultive Name of Substance 1: Alcohol ` 1 - Age of First Use: early 20's 1 - Amount (size/oz): varies; 42 ounce beer or more 1 - Frequency: daily 1 - Duration: on-going 1 - Last Use / Amount: Prior to arrival. He reports drinking a 40 ounce beer and (2) 16 ounce Mike[employer] Hard lemonade. 1 - Method of Aquiring: local stores 1- Route of Use: oral Name of Substance 2: THC 2 - Age of First Use: 36 years old 2 - Amount (size/oz): 1 gram 2 - Frequency: "every other day" 2 - Duration: unknown 2 - Last Use / Amount: 2 weeks ago 2 - Method of Aquiring: varies 2 - Route of Substance Use: inhalation Name of Substance 3: Cocaine 3 - Age of First Use: 36 years old 3 - Amount (size/oz): unkown 3 - Frequency: 1-2 times per month 3 - Duration: on-going 3 - Last Use / Amount: 3.5 months ago 3 - Method of Aquiring: varies 3 - Route of Substance Use: snorting              Sleep: Fair  Appetite:  Fair  Current Medications:  Current Facility-Administered Medications  Medication Dose Route Frequency Provider Last Rate Last  Admin   acetaminophen (TYLENOL) tablet 650 mg  650 mg Oral Q6H PRN Evette Georges, NP       alum & mag hydroxide-simeth (MAALOX/MYLANTA) 200-200-20 MG/5ML suspension 30 mL  30 mL Oral Q4H PRN Evette Georges, NP       chlordiazePOXIDE (LIBRIUM) capsule 25 mg  25 mg Oral QID Corky Sox, MD   25 mg at 03/20/22 1103   Followed by   Derrill Memo ON 03/21/2022] chlordiazePOXIDE (LIBRIUM) capsule 25 mg  25 mg Oral TID Corky Sox, MD       Followed by   Derrill Memo ON 03/22/2022] chlordiazePOXIDE (LIBRIUM) capsule 25 mg  25 mg Oral Leonard Schwartz, MD       Followed by   Derrill Memo ON 03/23/2022] chlordiazePOXIDE (LIBRIUM) capsule 25 mg  25 mg Oral Daily Corky Sox, MD       hydrOXYzine (ATARAX) tablet 25 mg  25 mg Oral Q6H PRN Evette Georges, NP       loperamide (IMODIUM) capsule 2-4 mg  2-4 mg Oral PRN Evette Georges, NP       LORazepam (ATIVAN) tablet 1 mg  1 mg Oral Q6H PRN Evette Georges, NP       magnesium hydroxide (MILK OF MAGNESIA) suspension 30 mL  30 mL Oral Daily PRN Evette Georges, NP       multivitamin with minerals tablet 1 tablet  1 tablet Oral Daily Evette Georges, NP   1 tablet at 03/20/22 1100   multivitamin with minerals tablet 1 tablet  1 tablet Oral Daily Corky Sox, MD       ondansetron (ZOFRAN-ODT) disintegrating tablet 4 mg  4 mg Oral Q6H PRN Evette Georges, NP       Derrill Memo ON 03/21/2022] thiamine (VITAMIN B1) tablet 100 mg  100 mg Oral Daily Corky Sox, MD       No current outpatient medications on file.    Labs  Lab Results:  Admission on 03/19/2022  Component Date Value Ref Range Status   SARS Coronavirus 2 by RT PCR 03/19/2022 NEGATIVE  NEGATIVE Final   Comment: (NOTE) SARS-CoV-2 target nucleic acids are NOT DETECTED.  The SARS-CoV-2 RNA is generally detectable in upper respiratory specimens during the acute phase of infection. The lowest concentration of SARS-CoV-2 viral copies this assay can detect is 138 copies/mL. A negative result does not  preclude SARS-Cov-2 infection and should not be used as the sole basis for treatment or other patient management decisions. A negative result may occur with  improper specimen collection/handling, submission of specimen other than nasopharyngeal swab, presence of viral mutation(s) within the areas targeted by this assay, and inadequate number of viral copies(<138 copies/mL). A negative result must be combined with clinical observations, patient history, and epidemiological information. The expected result is Negative.  Fact Sheet for Patients:  EntrepreneurPulse.com.au  Fact Sheet for Healthcare Providers:  IncredibleEmployment.be  This test is no                          t yet approved or cleared by the [place] FDA and  has been authorized for detection and/or diagnosis of SARS-CoV-2 by FDA under an Emergency Use Authorization (EUA). This EUA will remain  in effect (meaning this test can be used) for the duration of the COVID-19 declaration under Section 564(b)(1) of the Act, 21 U.S.C.section 360bbb-3(b)(1), unless the authorization is terminated  or revoked sooner.       Influenza A by PCR 03/19/2022 NEGATIVE  NEGATIVE Final   Influenza B by PCR 03/19/2022 NEGATIVE  NEGATIVE Final   Comment: (NOTE) The Xpert Xpress SARS-CoV-2/FLU/RSV plus assay is intended as an aid in the diagnosis of influenza from Nasopharyngeal swab specimens and should not be used as a sole basis for treatment. Nasal washings and aspirates are unacceptable for Xpert Xpress SARS-CoV-2/FLU/RSV testing.  Fact Sheet for Patients: EntrepreneurPulse.com.au  Fact Sheet for Healthcare Providers: IncredibleEmployment.be  This test is not yet approved or cleared by the [place] FDA and has been authorized for detection and/or diagnosis of SARS-CoV-2 by FDA under an Emergency Use Authorization (EUA). This EUA will remain in  effect (meaning this test can be used) for the duration of the COVID-19 declaration under Section 564(b)(1) of the Act, 21 U.S.C. section 360bbb-3(b)(1), unless the authorization is terminated or revoked.  Performed at [facility], [place] [address], [place], [place]  [id number]    Alcohol, Ethyl (B) 03/19/2022 318 (HH)  <10 mg/dL Final   Comment: CRITICAL RESULT CALLED TO, READ BACK BY AND VERIFIED WITH BOBBY BROOKS RN 03/20/22 0058 M KOROLESKI (NOTE) Lowest detectable limit for serum alcohol is 10 mg/dL.  For medical purposes only. Performed at [facility], [place] [address] [place], [place] [address]    WBC 03/19/2022 3.9 (L)  4.0 - 10.5 K/uL Final   RBC 03/19/2022 4.48  4.22 - 5.81 MIL/uL Final   Hemoglobin 03/19/2022 14.9  13.0 - 17.0 g/dL Final   HCT 03/19/2022 42.9  39.0 - 52.0 % Final   MCV 03/19/2022 95.8  80.0 - 100.0 fL Final   MCH 03/19/2022 33.3  26.0 - 34.0 pg Final   MCHC 03/19/2022 34.7  30.0 - 36.0 g/dL Final   RDW 03/19/2022 13.1  11.5 - 15.5 % Final   Platelets 03/19/2022 183  150 - 400 K/uL Final   nRBC 03/19/2022 0.0  0.0 - 0.2 % Final   Neutrophils Relative % 03/19/2022 42  % Final   Neutro Abs 03/19/2022 1.6 (L)  1.7 - 7.7 K/uL Final   Lymphocytes Relative 03/19/2022 47  % Final   Lymphs Abs 03/19/2022 1.8  0.7 - 4.0 K/uL Final   Monocytes Relative 03/19/2022 9  % Final   Monocytes Absolute 03/19/2022 0.4  0.1 - 1.0 K/uL Final   Eosinophils Relative 03/19/2022 1  % Final   Eosinophils Absolute 03/19/2022 0.1  0.0 - 0.5 K/uL Final   Basophils Relative 03/19/2022 1  % Final   Basophils Absolute 03/19/2022 0.0  0.0 - 0.1 K/uL Final   Immature Granulocytes 03/19/2022 0  % Final   Abs Immature Granulocytes 03/19/2022 0.01  0.00 - 0.07 K/uL Final   Performed at [facility], [place] [address], [place], [place] [address]   Sodium 03/19/2022 145  135 - 145 mmol/L Final   Potassium 03/19/2022 3.9  3.5 - 5.1 mmol/L Final   Chloride 03/19/2022 113 (H)   98 - 111 mmol/L Final   CO2 03/19/2022 23  22 - 32 mmol/L Final   Glucose, Bld 03/19/2022 77  70 - 99 mg/dL Final   Glucose reference range applies only to samples taken after fasting for at least 8 hours.   BUN 03/19/2022 7  6 - 20 mg/dL Final   Creatinine, Ser 03/19/2022 0.85  0.61 - 1.24 mg/dL Final   Calcium 03/19/2022 8.2 (L)  8.9 - 10.3 mg/dL Final   Total Protein 03/19/2022 4.9 (L)  6.5 - 8.1 g/dL Final   Albumin 03/19/2022 2.6 (L)  3.5 - 5.0 g/dL Final   AST 03/19/2022 48 (H)  15 - 41 U/L Final   ALT 03/19/2022 56 (H)  0 - 44 U/L Final   Alkaline Phosphatase 03/19/2022 53  38 - 126 U/L Final   Total Bilirubin 03/19/2022 0.2 (L)  0.3 - 1.2 mg/dL Final   GFR, Estimated 03/19/2022 >60  >60 mL/min Final   Comment: (NOTE) Calculated using the CKD-EPI Creatinine Equation (2021)    Anion gap 03/19/2022 9  5 - 15 Final   Performed at [facility] [address], [place], [place] [address]   TSH 03/19/2022 0.734  0.350 - 4.500 uIU/mL Final   Comment: Performed by a 3rd Generation assay with a functional sensitivity of <=0.01 uIU/mL. Performed at [facility], [place] [address] [place], [place] [address]    POC Amphetamine UR 03/19/2022 None Detected  NONE DETECTED (Cut Off Level 1000 ng/mL) Final   POC Secobarbital (BAR) 03/19/2022 None Detected  NONE DETECTED (Cut Off Level 300 ng/mL) Final   POC Buprenorphine (BUP) 03/19/2022 None Detected  NONE DETECTED (Cut Off Level 10 ng/mL) Final   POC Oxazepam (BZO) 03/19/2022 None Detected  NONE DETECTED (Cut Off Level 300 ng/mL) Final   POC Cocaine UR 03/19/2022 None Detected  NONE DETECTED (Cut Off Level 300 ng/mL) Final   POC Methamphetamine UR 03/19/2022 None Detected  NONE DETECTED (Cut Off Level 1000 ng/mL) Final   POC Morphine 03/19/2022 None Detected  NONE DETECTED (Cut Off Level 300 ng/mL) Final   POC Methadone UR 03/19/2022 None Detected  NONE DETECTED (Cut Off Level 300 ng/mL) Final   POC Oxycodone UR 03/19/2022 None  Detected  NONE DETECTED (Cut Off Level 100 ng/mL) Final   POC Marijuana UR 03/19/2022 Positive (A)  NONE DETECTED (Cut Off Level 50 ng/mL) Final   SARSCOV2ONAVIRUS 2 AG 03/20/2022 NEGATIVE  NEGATIVE Final   Comment: (NOTE) SARS-CoV-2 antigen NOT DETECTED.   Negative results are presumptive.  Negative results do not preclude SARS-CoV-2 infection and should not be used as the sole basis for treatment or other patient management decisions, including infection  control decisions, particularly in the presence of clinical signs and  symptoms consistent with COVID-19, or in those who have been in contact with the virus.  Negative results must be combined with clinical observations, patient history, and epidemiological information. The expected result is Negative.  Fact Sheet for Patients: HandmadeRecipes.com.cy  Fact Sheet for Healthcare Providers: FuneralLife.at  This test is not yet approved or cleared by the [place] FDA and  has been authorized for detection and/or diagnosis of SARS-CoV-2 by FDA under an Emergency Use Authorization (EUA).  This EUA will remain in effect (meaning this test can be used) for the duration of  the COV                          ID-19 declaration under Section 564(b)(1) of the Act, 21 U.S.C. section 360bbb-3(b)(1), unless the authorization is terminated or revoked sooner.    Admission on 03/11/2022, Discharged on 03/12/2022  Component Date Value Ref Range Status   SARS Coronavirus 2 by RT PCR 03/11/2022 NEGATIVE  NEGATIVE Final   Comment: (NOTE) SARS-CoV-2 target nucleic acids are NOT DETECTED.  The SARS-CoV-2 RNA is generally detectable in upper respiratory specimens during the acute phase of infection. The lowest concentration of SARS-CoV-2 viral copies this assay can detect is 138 copies/mL. A negative result does not preclude SARS-Cov-2 infection and should not be used as the sole basis for treatment  or other patient management decisions. A negative result may occur with  improper specimen collection/handling, submission of specimen other than nasopharyngeal swab, presence of viral mutation(s) within the areas targeted by this assay, and inadequate number of viral copies(<138 copies/mL). A negative result must be combined with clinical observations, patient history, and epidemiological information. The expected result is Negative.  Fact Sheet for Patients:  EntrepreneurPulse.com.au  Fact Sheet for Healthcare Providers:  IncredibleEmployment.be  This test is no                          t yet approved or cleared by the [place] FDA and  has been authorized for detection and/or diagnosis of SARS-CoV-2 by FDA under an Emergency Use Authorization (EUA). This EUA will remain  in effect (meaning  this test can be used) for the duration of the COVID-19 declaration under Section 564(b)(1) of the Act, 21 U.S.C.section 360bbb-3(b)(1), unless the authorization is terminated  or revoked sooner.       Influenza A by PCR 03/11/2022 NEGATIVE  NEGATIVE Final   Influenza B by PCR 03/11/2022 NEGATIVE  NEGATIVE Final   Comment: (NOTE) The Xpert Xpress SARS-CoV-2/FLU/RSV plus assay is intended as an aid in the diagnosis of influenza from Nasopharyngeal swab specimens and should not be used as a sole basis for treatment. Nasal washings and aspirates are unacceptable for Xpert Xpress SARS-CoV-2/FLU/RSV testing.  Fact Sheet for Patients: EntrepreneurPulse.com.au  Fact Sheet for Healthcare Providers: IncredibleEmployment.be  This test is not yet approved or cleared by the [place] FDA and has been authorized for detection and/or diagnosis of SARS-CoV-2 by FDA under an Emergency Use Authorization (EUA). This EUA will remain in effect (meaning this test can be used) for the duration of the COVID-19 declaration under  Section 564(b)(1) of the Act, 21 U.S.C. section 360bbb-3(b)(1), unless the authorization is terminated or revoked.  Performed at [facility], [place] [address], [place], [place] [address]    WBC 03/11/2022 5.4  4.0 - 10.5 K/uL Final   RBC 03/11/2022 4.53  4.22 - 5.81 MIL/uL Final   Hemoglobin 03/11/2022 15.5  13.0 - 17.0 g/dL Final   HCT 03/11/2022 42.9  39.0 - 52.0 % Final   MCV 03/11/2022 94.7  80.0 - 100.0 fL Final   MCH 03/11/2022 34.2 (H)  26.0 - 34.0 pg Final   MCHC 03/11/2022 36.1 (H)  30.0 - 36.0 g/dL Final   RDW 03/11/2022 12.9  11.5 - 15.5 % Final   Platelets 03/11/2022 277  150 - 400 K/uL Final   nRBC 03/11/2022 0.0  0.0 - 0.2 % Final   Neutrophils Relative % 03/11/2022 49  % Final   Neutro Abs 03/11/2022 2.7  1.7 - 7.7 K/uL Final   Lymphocytes Relative 03/11/2022 41  % Final   Lymphs Abs 03/11/2022 2.2  0.7 - 4.0 K/uL Final   Monocytes Relative 03/11/2022 7  % Final   Monocytes Absolute 03/11/2022 0.4  0.1 - 1.0 K/uL Final   Eosinophils Relative 03/11/2022 2  % Final   Eosinophils Absolute 03/11/2022 0.1  0.0 - 0.5 K/uL Final   Basophils Relative 03/11/2022 1  % Final   Basophils Absolute 03/11/2022 0.0  0.0 - 0.1 K/uL Final   Immature Granulocytes 03/11/2022 0  % Final   Abs Immature Granulocytes 03/11/2022 0.02  0.00 - 0.07 K/uL Final   Performed at [facility], [place] [address] [place], [place] [address]   Sodium 03/11/2022 144  135 - 145 mmol/L Final   Potassium 03/11/2022 3.7  3.5 - 5.1 mmol/L Final   Chloride 03/11/2022 113 (H)  98 - 111 mmol/L Final   CO2 03/11/2022 21 (L)  22 - 32 mmol/L Final   Glucose, Bld 03/11/2022 99  70 - 99 mg/dL Final   Glucose reference range applies only to samples taken after fasting for at least 8 hours.   BUN 03/11/2022 10  6 - 20 mg/dL Final   Creatinine, Ser 03/11/2022 0.91  0.61 - 1.24 mg/dL Final   Calcium 03/11/2022 8.6 (L)  8.9 - 10.3 mg/dL Final   Total Protein 03/11/2022 5.9 (L)  6.5 - 8.1 g/dL Final   Albumin  03/11/2022 3.2 (L)  3.5 - 5.0 g/dL Final   AST 03/11/2022 27  15 - 41 U/L  Final   ALT 03/11/2022 44  0 - 44 U/L Final   Alkaline Phosphatase 03/11/2022 56  38 - 126 U/L Final   Total Bilirubin 03/11/2022 0.3  0.3 - 1.2 mg/dL Final   GFR, Estimated 03/11/2022 >60  >60 mL/min Final   Comment: (NOTE) Calculated using the CKD-EPI Creatinine Equation (2021)    Anion gap 03/11/2022 10  5 - 15 Final   Performed at [facility], [address], [place], [place] [address]   Hgb A1c MFr Bld 03/11/2022 4.9  4.8 - 5.6 % Final   Comment: (NOTE) Pre diabetes:          5.7%-6.4%  Diabetes:              >6.4%  Glycemic control for   <7.0% adults with diabetes    Mean Plasma Glucose 03/11/2022 93.93  mg/dL Final   Performed at [facility], [address] [place], [place] [address]   Magnesium 03/11/2022 2.3  1.7 - 2.4 mg/dL Final   Performed at [facility], [address] [place], [place] [address]   Alcohol, Ethyl (B) 03/11/2022 269 (H)  <10 mg/dL Final   Comment: (NOTE) Lowest detectable limit for serum alcohol is 10 mg/dL.  For medical purposes only. Performed at [facility], [address] [place], [place] [address]    Cholesterol 03/11/2022 157  0 - 200 mg/dL Final   Triglycerides 93/57/0177 548 (H)  <150 mg/dL Final   HDL 93/90/3009 60  >40 mg/dL Final   Total CHOL/HDL Ratio 03/11/2022 2.6  RATIO Final   VLDL 03/11/2022 UNABLE TO CALCULATE IF TRIGLYCERIDE OVER 400 mg/dL  0 - 40 mg/dL Final   LDL Cholesterol 03/11/2022 UNABLE TO CALCULATE IF TRIGLYCERIDE OVER 400 mg/dL  0 - 99 mg/dL Final   Comment:        Total Cholesterol/HDL:CHD Risk Coronary Heart Disease Risk Table                     Men   Women  1/2 Average Risk   3.4   3.3  Average Risk       5.0   4.4  2 X Average Risk   9.6   7.1  3 X Average Risk  23.4   11.0        Use the calculated Patient Ratio above and the CHD Risk Table to determine the patient's CHD Risk.        ATP III  CLASSIFICATION (LDL):  <100     mg/dL   Optimal  233-007  mg/dL   Near or Above                    Optimal  130-159  mg/dL   Borderline  622-633  mg/dL   High  >354     mg/dL   Very High Performed at [facility], [address], [place], [place] [address]    TSH 03/11/2022 1.019  0.350 - 4.500 uIU/mL Final   Comment: Performed by a 3rd Generation assay with a functional sensitivity of <=0.01 uIU/mL. Performed at [facility], [address], [place], [place] [address]    RPR Ser Ql 03/11/2022 NON REACTIVE  NON REACTIVE Final   Performed at [facility], [address] [place], [place] [address]   Chlamydia 03/11/2022 Negative   Final   Neisseria Gonorrhea 03/11/2022 Negative   Final  Comment 03/11/2022 Normal Reference [place] Chlamydia - Negative   Final   Comment 03/11/2022 Normal Reference Range Neisseria Gonorrhea - Negative   Final   POC Amphetamine UR 03/11/2022 None Detected  NONE DETECTED (Cut Off Level 1000 ng/mL) Final   POC Secobarbital (BAR) 03/11/2022 None Detected  NONE DETECTED (Cut Off Level 300 ng/mL) Final   POC Buprenorphine (BUP) 03/11/2022 None Detected  NONE DETECTED (Cut Off Level 10 ng/mL) Final   POC Oxazepam (BZO) 03/11/2022 None Detected  NONE DETECTED (Cut Off Level 300 ng/mL) Final   POC Cocaine UR 03/11/2022 None Detected  NONE DETECTED (Cut Off Level 300 ng/mL) Final   POC Methamphetamine UR 03/11/2022 Positive (A)  NONE DETECTED (Cut Off Level 1000 ng/mL) Final   POC Morphine 03/11/2022 None Detected  NONE DETECTED (Cut Off Level 300 ng/mL) Final   POC Methadone UR 03/11/2022 None Detected  NONE DETECTED (Cut Off Level 300 ng/mL) Final   POC Oxycodone UR 03/11/2022 None Detected  NONE DETECTED (Cut Off Level 100 ng/mL) Final   POC Marijuana UR 03/11/2022 Positive (A)  NONE DETECTED (Cut Off Level 50 ng/mL) Final   SARS Coronavirus 2 Ag 03/11/2022 Negative  Negative Final   Direct LDL 03/11/2022 49  0 - 99 mg/dL Final   Performed at  [facility], [place] [address], [place], [place] [address]    Blood Alcohol level:  Lab Results  Component Value Date   ETH 318 [facility]) 03/19/2022   ETH 269 (H) 123XX123    Metabolic Disorder Labs: Lab Results  Component Value Date   HGBA1C 4.9 03/11/2022   MPG 93.93 03/11/2022   No results found for: "PROLACTIN" Lab Results  Component Value Date   CHOL 157 03/11/2022   TRIG 548 (H) 03/11/2022   HDL 60 03/11/2022   CHOLHDL 2.6 03/11/2022   VLDL UNABLE TO CALCULATE IF TRIGLYCERIDE OVER 400 mg/dL 03/11/2022   LDLCALC UNABLE TO CALCULATE IF TRIGLYCERIDE OVER 400 mg/dL 03/11/2022    Therapeutic Lab Levels: No results found for: "LITHIUM" No results found for: "VALPROATE" No results found for: "CBMZ"  Physical Findings   AIMS    Flowsheet Row Admission (Discharged) from OP Visit from 07/12/2019 in [facility]  AIMS Total Score 0      AUDIT    Flowsheet Row Admission (Discharged) from OP Visit from 07/12/2019 in [facility] [facility] Admission (Discharged) from 04/25/2018 in [facility]  Alcohol Use Disorder Identification Test Final Score (AUDIT) 13 28      [place] ED from 03/19/2022 in [facility] ED from 03/11/2022 in [facility] ED from 08/16/2021 in [facility] [place] No Risk No Risk No Risk        Musculoskeletal  Strength & Muscle Tone: within normal limits Gait & Station: normal Patient leans: N/A  Psychiatric Specialty Exam  Presentation  General Appearance:  Casual  Eye Contact: Fair  Speech: Clear and Coherent  Speech Volume: normal  Mood and Affect  Mood: Euthymic  Affect: Appropriate; Congruent   Thought Process  Thought Processes: Coherent; Goal Directed  Descriptions of Associations:Intact  Orientation:Full (Time, Place and  Person)  Thought Content:Logical; WDL  Diagnosis of Schizophrenia or Schizoaffective disorder in past: No    Hallucinations:none Ideas of Reference:None  Suicidal Thoughts: none Homicidal Thoughts: none  Sensorium  Memory: Immediate Fair; Recent Fair  Judgment: Fair  Insight: fair   Executive Functions  Concentration: Fair  Attention Span: Fair  Recall: [employer] of Knowledge: Fair  Language: Fair   Psychomotor Activity  Psychomotor Activity: normal  Assets  Assets: Desire for Improvement; Resilience   Sleep  Sleep: fair    Physical Exam  Physical Exam Constitutional:      Appearance: the patient is not toxic-appearing.  Pulmonary:     Effort: Pulmonary effort is normal.  Neurological:     General: No focal deficit present.     Mental Status: the patient is alert and oriented to person, place, and time.   Review of Systems  Respiratory:  Negative for shortness of breath.   Cardiovascular:  Negative for chest pain.  Gastrointestinal:  Negative for abdominal pain, constipation, diarrhea, nausea and vomiting.  Neurological:  Negative for headaches.   Blood pressure 122/89, pulse 83, temperature 98.3 F (36.8 C), temperature source Oral, resp. rate 18, SpO2 100 %. There is no height or weight on file to calculate BMI.  Treatment Plan Summary: Daily contact with patient to assess and evaluate symptoms and progress in treatment and Medication management  Status: Voluntary  AUD -Librium taper -CIWA w PRN Ativan  Patient denies taking any home medications  Labs: leukopenia of 3.9 with ANC of 1.6, possible BEN Slight increased AST/ALT BAL 318 on admission UDS with MJ  Dispo: [facility] on Monday  Corky Sox, MD 03/20/2022 12:08 PM

## 2022-03-20 NOTE — Progress Notes (Signed)
Rodd completed his safety plan, a copy was given to him and one was placed in his chart.

## 2022-03-20 NOTE — ED Notes (Signed)
Patient attended group meeting in courtyard we talked about a variety of subjects, including, dealing with their addictions, taking responsibility for their actions, what are their plans when they leave here, working on mending old wounds and maybe trying to heal and make things right with their loved ones. 

## 2022-03-20 NOTE — Progress Notes (Signed)
Patient is requesting to see a provider and wants to discharge tonight.  Providers Levonne Spiller NP and C. Onuoha NP notified of request.

## 2022-03-20 NOTE — ED Provider Notes (Signed)
FBC/OBS ASAP Discharge Summary  Date and Time: 03/20/2022 9:24 PM  Name: Howard Adams  MRN:  [id number]   Discharge Diagnoses:  Final diagnoses:  Alcohol abuse with intoxication ([place])  Homelessness  Substance abuse ([place])    Subjective: Per chart review "Howard Adams is a 36 year old male with a history of alcohol use disorder and two prior behavioral health admissions both which involved the patient making suicidal statements. He presented to the [facility] urgent care and was admitted to the facility based crisis. He is currently requesting placement in residential rehab. He is currently homeless, has no source of income, and no insurance. He reports experiencing alcohol withdrawal symptoms previously and has been started on a Librium taper. He has been accepted to [facility] for Monday".   Stay Summary: Patient admitted 03/20/22 to the [facility] for substance abuse treatment.  Pt requested to leave AMA tonight because " I have got some family stuff that I need to handle and I missed my daughter's birthday and I talked to my lady and we got this huge [employer] bill and other bills we have to pay, and I can't afford to go nowhere for 30 days. "I just spoke to the [place] people and I will start classes outside, but  I need money, and need to return back to work because I have 6 kids and my woman at home and can't take the risk of having the power cut off".  Patient denies SI, denies HI, denies AVH or paranoia. On evaluation, patient is alert, oriented x 4, and cooperative. Speech is clear, coherent and logical. Pt appears disheveled. Eye contact is fair. Mood is euthymic, affect is congruent with mood. Thought process and thought content is coherent. Pt denies SI/HI/AVH. There is no indication that the patient is responding to internal stimuli. No delusions elicited during this assessment.    Total Time spent with patient: 15 minutes  Past Psychiatric History: See H & P Past  Medical History:  Past Medical History:  Diagnosis Date   Asthma    No past surgical history on file. Family History:  Family History  Problem Relation Age of Onset   Hypertension Father    Family Psychiatric History: See H & P Social History:  Social History   Substance and Sexual Activity  Alcohol Use Yes   Alcohol/week: 4.0 standard drinks of alcohol   Types: 4 Cans of beer per week   Comment: 4-5 cans a week     Social History   Substance and Sexual Activity  Drug Use Yes   Types: Marijuana   Comment: 2 weeks ago    Social History   Socioeconomic History   Marital status: Significant Other    Spouse name: Not on file   Number of children: Not on file   Years of education: Not on file   Highest education level: Not on file  Occupational History   Not on file  Tobacco Use   Smoking status: Every Day    Packs/day: 0.50    Years: 10.00    Total pack years: 5.00    Types: Cigarettes   Smokeless tobacco: Never  Vaping Use   Vaping Use: Unknown  Substance and Sexual Activity   Alcohol use: Yes    Alcohol/week: 4.0 standard drinks of alcohol    Types: 4 Cans of beer per week    Comment: 4-5 cans a week   Drug use: Yes    Types: Marijuana  Comment: 2 weeks ago   Sexual activity: Yes    Birth control/protection: Condom  Other Topics Concern   Not on file  Social History Narrative   ** Merged History Encounter **       Social Determinants of Health   Financial Resource Strain: Unknown (03/28/2019)   Overall Financial Resource Strain (CARDIA)    Difficulty of Paying Living Expenses: Patient refused  Food Insecurity: Unknown (03/28/2019)   Hunger Vital Sign    Worried About Running Out of Food in the Last Year: Patient refused    Ran Out of Food in the Last Year: Patient refused  Transportation Needs: Unknown (03/28/2019)   PRAPARE - Transportation    Lack of Transportation (Medical): Patient refused    Lack of Transportation (Non-Medical): Patient  refused  Physical Activity: Unknown (03/28/2019)   Exercise Vital Sign    Days of Exercise per Week: Patient refused    Minutes of Exercise per Session: Patient refused  Stress: Stress Concern Present (03/28/2019)   [employer] of Occupational Health - Occupational Stress Questionnaire    Feeling of Stress : Very much  Social Connections: Unknown (03/28/2019)   Social Connection and Isolation Panel [NHANES]    Frequency of Communication with Friends and Family: Patient refused    Frequency of Social Gatherings with Friends and Family: Patient refused    Attends Religious Services: Patient refused    Active Member of Clubs or Organizations: Patient refused    Attends Banker Meetings: Patient refused    Marital Status: Patient refused   SDOH:  SDOH Screenings   Food Insecurity: Unknown (03/28/2019)  Transportation Needs: Unknown (03/28/2019)  Alcohol Screen: Medium Risk (07/12/2019)  Depression (PHQ2-9): Low Risk  (03/20/2022)  Recent Concern: Depression (PHQ2-9) - High Risk (03/20/2022)  Financial Resource Strain: Unknown (03/28/2019)  Physical Activity: Unknown (03/28/2019)  Social Connections: Unknown (03/28/2019)  Stress: Stress Concern Present (03/28/2019)  Tobacco Use: High Risk (08/16/2021)    Tobacco Cessation:  A prescription for an FDA-approved tobacco cessation medication was offered at discharge and the patient refused  Current Medications:  Current Facility-Administered Medications  Medication Dose Route Frequency Provider Last Rate Last Admin   acetaminophen (TYLENOL) tablet 650 mg  650 mg Oral Q6H PRN Sindy Guadeloupe, NP       alum & mag hydroxide-simeth (MAALOX/MYLANTA) 200-200-20 MG/5ML suspension 30 mL  30 mL Oral Q4H PRN Sindy Guadeloupe, NP       chlordiazePOXIDE (LIBRIUM) capsule 25 mg  25 mg Oral QID Carlyn Reichert, MD   25 mg at 03/20/22 1502   Followed by   Melene Muller ON 03/21/2022] chlordiazePOXIDE (LIBRIUM) capsule 25 mg  25 mg Oral TID Carlyn Reichert, MD       Followed by   Melene Muller ON 03/22/2022] chlordiazePOXIDE (LIBRIUM) capsule 25 mg  25 mg Oral Charlestine Night, MD       Followed by   Melene Muller ON 03/23/2022] chlordiazePOXIDE (LIBRIUM) capsule 25 mg  25 mg Oral Daily Carlyn Reichert, MD       hydrOXYzine (ATARAX) tablet 25 mg  25 mg Oral Q6H PRN Sindy Guadeloupe, NP       loperamide (IMODIUM) capsule 2-4 mg  2-4 mg Oral PRN Sindy Guadeloupe, NP       LORazepam (ATIVAN) tablet 1 mg  1 mg Oral Q6H PRN Sindy Guadeloupe, NP       magnesium hydroxide (MILK OF MAGNESIA) suspension 30 mL  30 mL Oral Daily PRN Sindy Guadeloupe, NP  multivitamin with minerals tablet 1 tablet  1 tablet Oral Daily Sindy Guadeloupe, NP   1 tablet at 03/20/22 1100   multivitamin with minerals tablet 1 tablet  1 tablet Oral Daily Carlyn Reichert, MD       ondansetron (ZOFRAN-ODT) disintegrating tablet 4 mg  4 mg Oral Q6H PRN Sindy Guadeloupe, NP       Melene Muller ON 03/21/2022] thiamine (VITAMIN B1) tablet 100 mg  100 mg Oral Daily Carlyn Reichert, MD       No current outpatient medications on file.    PTA Medications: (Not in a hospital admission)      03/20/2022    9:24 PM 03/20/2022    6:05 PM  Depression screen PHQ 2/9  Decreased Interest 0 0  Down, Depressed, Hopeless 0 3  PHQ - 2 Score 0 3  Altered sleeping 0 3  Tired, decreased energy 0 3  Change in appetite 0 3  Feeling bad or failure about yourself  0 3  Trouble concentrating 0 0  Moving slowly or fidgety/restless 0 0  Suicidal thoughts 0 0  PHQ-9 Score 0 15  Difficult doing work/chores Not difficult at all Somewhat difficult    Flowsheet Row ED from 03/19/2022 in [facility] ED from 03/11/2022 in [facility] ED from 08/16/2021 in [facility] RISK CATEGORY No Risk No Risk No Risk       Musculoskeletal  Strength & Muscle Tone: within normal limits Gait & Station: normal Patient leans: N/A  Psychiatric  Specialty Exam  Presentation  General Appearance:  Disheveled  Eye Contact: Fair  Speech: Clear and Coherent  Speech Volume: Normal  Handedness: Right   Mood and Affect  Mood: Euthymic  Affect: Congruent   Thought Process  Thought Processes: Coherent  Descriptions of Associations:Intact  Orientation:Full (Time, Place and Person)  Thought Content:WDL  Diagnosis of Schizophrenia or Schizoaffective disorder in past: No    Hallucinations:Hallucinations: None  Ideas of Reference:None  Suicidal Thoughts:Suicidal Thoughts: No  Homicidal Thoughts:Homicidal Thoughts: No   Sensorium  Memory: Immediate Good  Judgment: Fair  Insight: Fair   Art therapist  Concentration: Good  Attention Span: Good  Recall: Good  Fund of Knowledge: Good  Language: Good   Psychomotor Activity  Psychomotor Activity: Psychomotor Activity: Normal   Assets  Assets: Communication Skills; Desire for Improvement; Social Support   Sleep  Sleep: Sleep: Fair Number of Hours of Sleep: 4   Nutritional Assessment (For OBS and FBC admissions only) Has the patient had a weight loss or gain of 10 pounds or more in the last 3 months?: No Has the patient had a decrease in food intake/or appetite?: No Does the patient have dental problems?: No Does the patient have eating habits or behaviors that may be indicators of an eating disorder including binging or inducing vomiting?: No Has the patient recently lost weight without trying?: 0 Has the patient been eating poorly because of a decreased appetite?: 0 Malnutrition Screening Tool Score: 0    Physical Exam  Physical Exam Constitutional:      General: He is not in acute distress.    Appearance: He is normal weight. He is not diaphoretic.  HENT:     Head: Normocephalic.     Right Ear: External ear normal.     Left Ear: External ear normal.     Nose: No congestion.  Eyes:     General:  Right eye:  No discharge.        Left eye: No discharge.  Pulmonary:     Effort: Pulmonary effort is normal. No respiratory distress.  Chest:     Chest wall: No tenderness.  Neurological:     Mental Status: He is alert and oriented to person, place, and time.  Psychiatric:        Attention and Perception: Attention and perception normal.        Mood and Affect: Mood and affect normal.        Speech: Speech normal.        Behavior: Behavior normal.        Thought Content: Thought content normal.        Cognition and Memory: Cognition and memory normal.        Judgment: Judgment normal.    Review of Systems  Constitutional: Negative.   HENT: Negative.    Eyes: Negative.   Respiratory:  Negative for cough, shortness of breath and wheezing.   Cardiovascular: Negative.   Gastrointestinal:  Negative for diarrhea, nausea and vomiting.  Neurological:  Negative for dizziness, tingling, seizures, loss of consciousness, weakness and headaches.  Psychiatric/Behavioral: Negative.  Negative for depression, hallucinations, memory loss, substance abuse and suicidal ideas. The patient is not nervous/anxious and does not have insomnia.    Blood pressure (!) 138/108, pulse (!) 105, temperature 99.6 F (37.6 C), temperature source Oral, resp. rate 19, SpO2 100 %. There is no height or weight on file to calculate BMI.  Demographic Factors:  Male and Adolescent or young adult  Loss Factors: Financial problems/change in socioeconomic status  Historical Factors: Family history of mental illness or substance abuse  Risk Reduction Factors:   Responsible for children under 22 years of age, Sense of responsibility to family, Religious beliefs about death, Employed, Living with another person, especially a relative, Positive social support, Positive therapeutic relationship, and Positive coping skills or problem solving skills  Continued Clinical Symptoms:  Alcohol/Substance Abuse/Dependencies  Cognitive  Features That Contribute To Risk:  Thought constriction (tunnel vision)    Suicide Risk:  Minimal: No identifiable suicidal ideation.  Patients presenting with no risk factors but with morbid ruminations; may be classified as minimal risk based on the severity of the depressive symptoms  Plan Of Care/Follow-up recommendations:  Activity:  as tolerated  Disposition: Pt left AMA. Condition is stable.  Mancel Bale, NP 03/20/2022, 9:24 PM

## 2022-03-20 NOTE — BH IP Treatment Plan (Signed)
Interdisciplinary Treatment and Diagnostic Plan Update  03/20/2022 Time of Session: 10:04am Howard Adams MRN: [id number]  Diagnosis:  Final diagnoses:  Alcohol abuse with intoxication (HCC)  Homelessness  Substance abuse (HCC)     Current Medications:  Current Facility-Administered Medications  Medication Dose Route Frequency Provider Last Rate Last Admin   acetaminophen (TYLENOL) tablet 650 mg  650 mg Oral Q6H PRN Sindy Guadeloupe, NP       alum & mag hydroxide-simeth (MAALOX/MYLANTA) 200-200-20 MG/5ML suspension 30 mL  30 mL Oral Q4H PRN Sindy Guadeloupe, NP       chlordiazePOXIDE (LIBRIUM) capsule 25 mg  25 mg Oral QID Carlyn Reichert, MD       Followed by   Melene Muller ON 03/21/2022] chlordiazePOXIDE (LIBRIUM) capsule 25 mg  25 mg Oral TID Carlyn Reichert, MD       Followed by   Melene Muller ON 03/22/2022] chlordiazePOXIDE (LIBRIUM) capsule 25 mg  25 mg Oral Charlestine Night, MD       Followed by   Melene Muller ON 03/23/2022] chlordiazePOXIDE (LIBRIUM) capsule 25 mg  25 mg Oral Daily Carlyn Reichert, MD       hydrOXYzine (ATARAX) tablet 25 mg  25 mg Oral Q6H PRN Sindy Guadeloupe, NP       loperamide (IMODIUM) capsule 2-4 mg  2-4 mg Oral PRN Sindy Guadeloupe, NP       LORazepam (ATIVAN) tablet 1 mg  1 mg Oral Q6H PRN Sindy Guadeloupe, NP       magnesium hydroxide (MILK OF MAGNESIA) suspension 30 mL  30 mL Oral Daily PRN Sindy Guadeloupe, NP       multivitamin with minerals tablet 1 tablet  1 tablet Oral Daily Sindy Guadeloupe, NP   1 tablet at 03/19/22 2156   multivitamin with minerals tablet 1 tablet  1 tablet Oral Daily Carlyn Reichert, MD       ondansetron (ZOFRAN-ODT) disintegrating tablet 4 mg  4 mg Oral Q6H PRN Sindy Guadeloupe, NP       Melene Muller ON 03/21/2022] thiamine (VITAMIN B1) tablet 100 mg  100 mg Oral Daily Carlyn Reichert, MD       No current outpatient medications on file.   PTA Medications: Prior to Admission medications   Not on File    Patient Stressors: Educational concerns   Financial  difficulties   Legal issue   Marital or family conflict   Occupational concerns   Substance abuse    Patient Strengths: Ability for insight  Average or above average intelligence  Capable of independent living  [employer] of knowledge  Motivation for treatment/growth  Work skills   Treatment Modalities: Medication Management, Group therapy, Case management,  1 to 1 session with clinician, Psychoeducation, Recreational therapy.   Physician Treatment Plan for Primary and Secondary Diagnosis:  Final diagnoses:  Alcohol abuse with intoxication (HCC)  Homelessness  Substance abuse (HCC)   Long Term Goal(s):    Short Term Goals: Patient will verbalize feelings in meetings with treatment team members. Patient will attend at least of 50% of the groups daily. Pt will complete the PHQ9 on admission, day 3 and discharge. Patient will participate in completing the [place] Suicide Severity Rating Scale  Medication Management: Evaluate patient's response, side effects, and tolerance of medication regimen.  Therapeutic Interventions: 1 to 1 sessions, Unit Group sessions and Medication administration.  Evaluation of Outcomes: Progressing  LCSW Treatment Plan for Primary Diagnosis:  Final diagnoses:  Alcohol abuse with intoxication (HCC)  Homelessness  Substance abuse (HCC)  Long Term Goal(s): Safe transition to appropriate next level of care at discharge.  Short Term Goals: Facilitate acceptance of mental health diagnosis and concerns through verbal commitment to aftercare plan and appointments at discharge., Patient will identify one social support prior to discharge to aid in patient's recovery., Patient will attend AA/NA groups as scheduled., Identify minimum of 2 triggers associated with mental health/substance abuse issues with treatment team members., and Increase skills for wellness and recovery by attending 50% of scheduled groups.  Therapeutic Interventions: Assess for  all discharge needs, 1 to 1 time with Education officer, museum, Explore available resources and support systems, Assess for adequacy in community support network, Educate family and significant other(s) on suicide prevention, Complete Psychosocial Assessment, Interpersonal group therapy.  Evaluation of Outcomes: Progressing   Progress in Treatment: Attending groups: Yes. Participating in groups: Yes. Taking medication as prescribed: Yes. Toleration medication: Yes. Family/Significant other contact made: No, will contact:  patient did not provide permission for LCSW to gain collateral.  Patient understands diagnosis: Yes. Discussing patient identified problems/goals with staff: Yes. Medical problems stabilized or resolved: Yes. Denies suicidal/homicidal ideation: Yes. Issues/concerns per patient self-inventory: Yes. Other: substance use; current living situation; stressors about instability.   New problem(s) identified: No, Describe:  other than issues reported on admission  New Short Term/Long Term Goal(s): Patient reports an interest in residential treatment at this time.   Patient Goals:  Patient reports an interest in residential treatment at this time. Patient reports he also wants to be more intentional about his personal hygiene and how he treats himself.   Discharge Plan or Barriers: Patient has been accepted to [facility] and can transfer to the facility on Monday by 9:00am. Patient is agreeable to plan.   Reason for Continuation of Hospitalization: Medication stabilization Withdrawal symptoms Other; describe further monitoring and treatment   Estimated Length of Stay: 3-5 days  Last 3 [place] Suicide Severity Risk Score: [place] ED from 03/19/2022 in [facility] ED from 03/11/2022 in [facility] ED from 08/16/2021 in [facility] [place] No Risk No Risk No Risk        Last PHQ 2/9 Scores:     No data to display          Scribe for Treatment Team: Gigi Gin 03/20/2022 10:51 AM

## 2022-03-20 NOTE — ED Notes (Signed)
Pt. Is attending AA. ?

## 2022-03-20 NOTE — ED Notes (Signed)
Provider Jimmye Norman made notified of bld ETOH 318 no new orders obtained.

## 2022-03-20 NOTE — Progress Notes (Signed)
He received his 2pm Librium and went to his room to rest until dinner. He has made several calls throughout the day. He verbalized earlier while filling out his safety plan how much he enjoys drinking alcohol.

## 2022-03-20 NOTE — Progress Notes (Signed)
Received Howard Adams this AM in the dining room eating his breakfast, afterwards he returned to bed. Later he showered and received clean scrubs. He was compliant with his medications. He endorsed feeling depressed this AM. He denied all of the other psychiatric symptoms.
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
## 2022-03-20 NOTE — ED Notes (Signed)
Pt a/o x 4. Denies SI/HI/AVH. Denies withdrawal symptoms. Requests dc this evening. No noted resp distress. Will continue to monitor for safety.

## 2022-03-20 NOTE — ED Notes (Addendum)
Provider met with pt.agrees pt can dc AMA.  Pt continues to request dc tonight. Pt. Signed AMA form. Belongings  given to pt by security.  Pt d/c from unit

## 2022-03-20 NOTE — Discharge Instructions (Signed)

## 2022-04-20 ENCOUNTER — Other Ambulatory Visit (HOSPITAL_COMMUNITY)
Admission: EM | Admit: 2022-04-20 | Discharge: 2022-04-22 | Disposition: A | Discharge status: Home / Self Care | Payer: No Payment, Other | Attending: Psychiatry | Admitting: Psychiatry

## 2022-04-20 DIAGNOSIS — F1020 Alcohol dependence, uncomplicated: Secondary | ICD-10-CM

## 2022-04-20 DIAGNOSIS — Z1152 Encounter for screening for COVID-19: Secondary | ICD-10-CM | POA: Diagnosis not present

## 2022-04-20 DIAGNOSIS — F1090 Alcohol use, unspecified, uncomplicated: Secondary | ICD-10-CM

## 2022-04-20 DIAGNOSIS — F1990 Other psychoactive substance use, unspecified, uncomplicated: Secondary | ICD-10-CM

## 2022-04-20 DIAGNOSIS — F1010 Alcohol abuse, uncomplicated: Secondary | ICD-10-CM | POA: Diagnosis present

## 2022-04-20 DIAGNOSIS — F1994 Other psychoactive substance use, unspecified with psychoactive substance-induced mood disorder: Secondary | ICD-10-CM | POA: Diagnosis not present

## 2022-04-20 LAB — CBC WITH DIFFERENTIAL/PLATELET
Abs Immature Granulocytes: 0.02 10*3/uL (ref 0.00–0.07)
Basophils Absolute: 0 10*3/uL (ref 0.0–0.1)
Basophils Relative: 0 %
Eosinophils Absolute: 0 10*3/uL (ref 0.0–0.5)
Eosinophils Relative: 1 %
HCT: 36.7 % — ABNORMAL LOW (ref 39.0–52.0)
Hemoglobin: 13 g/dL (ref 13.0–17.0)
Immature Granulocytes: 0 %
Lymphocytes Relative: 35 %
Lymphs Abs: 1.6 10*3/uL (ref 0.7–4.0)
MCH: 33.4 pg (ref 26.0–34.0)
MCHC: 35.4 g/dL (ref 30.0–36.0)
MCV: 94.3 fL (ref 80.0–100.0)
Monocytes Absolute: 0.3 10*3/uL (ref 0.1–1.0)
Monocytes Relative: 7 %
Neutro Abs: 2.6 10*3/uL (ref 1.7–7.7)
Neutrophils Relative %: 57 %
Platelets: 200 10*3/uL (ref 150–400)
RBC: 3.89 MIL/uL — ABNORMAL LOW (ref 4.22–5.81)
RDW: 13 % (ref 11.5–15.5)
WBC: 4.6 10*3/uL (ref 4.0–10.5)
nRBC: 0 % (ref 0.0–0.2)

## 2022-04-20 LAB — POC SARS CORONAVIRUS 2 AG: SARSCOV2ONAVIRUS 2 AG: NEGATIVE

## 2022-04-20 MED ORDER — THIAMINE MONONITRATE 100 MG PO TABS
100.0000 mg | ORAL_TABLET | Freq: Every day | ORAL | Status: DC
  Administered 2022-04-21 – 2022-04-22 (×2): 100 mg via ORAL
  Filled 2022-04-20 (×2): qty 1

## 2022-04-20 MED ORDER — LOPERAMIDE HCL 2 MG PO CAPS: 2.0000 mg | ORAL_CAPSULE | ORAL | Status: DC | PRN

## 2022-04-20 MED ORDER — THIAMINE HCL 100 MG/ML IJ SOLN: 100.0000 mg | Freq: Once | INTRAMUSCULAR | Status: DC

## 2022-04-20 MED ORDER — HYDROXYZINE HCL 25 MG PO TABS: 25.0000 mg | ORAL_TABLET | Freq: Four times a day (QID) | ORAL | Status: DC | PRN

## 2022-04-20 MED ORDER — LORAZEPAM 1 MG PO TABS: 1.0000 mg | ORAL_TABLET | Freq: Four times a day (QID) | ORAL | Status: DC | PRN

## 2022-04-20 MED ORDER — MAGNESIUM HYDROXIDE 400 MG/5ML PO SUSP: 30.0000 mL | Freq: Every day | ORAL | Status: DC | PRN

## 2022-04-20 MED ORDER — METHOCARBAMOL 500 MG PO TABS: 500.0000 mg | ORAL_TABLET | Freq: Three times a day (TID) | ORAL | Status: DC | PRN

## 2022-04-20 MED ORDER — ONDANSETRON 4 MG PO TBDP: 4.0000 mg | ORAL_TABLET | Freq: Four times a day (QID) | ORAL | Status: DC | PRN

## 2022-04-20 MED ORDER — ALUM & MAG HYDROXIDE-SIMETH 200-200-20 MG/5ML PO SUSP: 30.0000 mL | ORAL | Status: DC | PRN

## 2022-04-20 MED ORDER — NAPROXEN 500 MG PO TABS: 500.0000 mg | ORAL_TABLET | Freq: Two times a day (BID) | ORAL | Status: DC | PRN

## 2022-04-20 MED ORDER — ADULT MULTIVITAMIN W/MINERALS CH
1.0000 | ORAL_TABLET | Freq: Every day | ORAL | Status: DC
  Administered 2022-04-21 – 2022-04-22 (×2): 1 via ORAL
  Filled 2022-04-20 (×2): qty 1

## 2022-04-20 MED ORDER — ACETAMINOPHEN 325 MG PO TABS: 650.0000 mg | ORAL_TABLET | Freq: Four times a day (QID) | ORAL | Status: DC | PRN

## 2022-04-20 MED ORDER — DICYCLOMINE HCL 20 MG PO TABS: 20.0000 mg | ORAL_TABLET | Freq: Four times a day (QID) | ORAL | Status: DC | PRN

## 2022-04-20 NOTE — ED Notes (Signed)
Patient alert and oriented x 4, calm and cooperative. Patient denies SI/HI/AVH. Skin check completed and no abnormalities noted. Pt was given dinner and oriented to unit. Pt is here for substance abuse treatment and will be transferred to the [facility] unit after 2 hr covid test is back and negative. Pt is safe on unit

## 2022-04-20 NOTE — ED Provider Notes (Signed)
Facility Based Crisis Admission H&P  Date: 04/21/22 Patient Name: Howard Adams MRN: [id number] Chief Complaint:  Chief Complaint  Patient presents with   Alcohol Problem      Diagnoses:  Final diagnoses:  Substance use disorder  Alcohol use disorder    HPI: Howard Adams is a 36 year old male with history of substance use disorder, alcohol dependency, and depression.  Patient presented voluntarily to [facility] requesting alcohol detox and substance abuse treatment.  This nurse practitioner met with patient face-to-face, reviewed his chart, and consulted with Dr. Lovette Cliche.  On evaluation, Howard Adams was noted to be sleeping on the chair in assessment room in no apparent distress.  He is arousable to verbal prompt.  He is alert and oriented x 4; he is calm and cooperative.  He appears disheveled.  His speech is clear and coherent.  He has good eye contact.  His mood is depressed with congruent affect.  He did not appear to be responding to any internal/external stimuli or experiencing any delusional thought content.  patient reports that he has extensive history of alcohol abuse.  He reports that he has attempted to stop drinking alcohol several times this year but was unsuccessful. He says he typically drinks 2-3 cans of 40oz beers daily but over the past 3 days he has been drinking excessive amount of alcohol in celebration of his birthday. He reports "I've been drinking liquor till I black out." He says that he is disappointed and feels guilty due to alcohol abuse however he denies other depressive symptoms. He says he wants to stop drinking alcohol, get his life together, and be active in his kids lives. He is requesting alcohol detox and would like to go to a "28-days residential substance abuse program." He denies suicidal ideation and history of suicidal attempt. He denies homicidal ideation, paranoia, and hallucination. He reports that he smokes marijuana 2 days/week. He denies all other  substance use. He says his girlfriend recently kicked him out of their home due to his alcohol abuse. He denies history of alcohol withdrawal seizure or DTs. He says he experiences chills, sweats, anxiety, and hand tremors during detox.   Discussed admission to [facility] for management of alcohol withdrawal symptoms with patient and patient is agreeable with plan.      PHQ 2-9:  Howard Adams ED from 04/20/2022 in [facility] ED from 03/19/2022 in [facility]  Thoughts that you would be better off dead, or of hurting yourself in some way Not at all Not at all  PHQ-9 Total Score 0 0       Flowsheet Row ED from 04/20/2022 in [facility] ED from 03/19/2022 in [facility] ED from 03/11/2022 in [place] No Risk No Risk No Risk        Total Time spent with patient: 30 minutes  Musculoskeletal  Strength & Muscle Tone: within normal limits Gait & Station: normal Patient leans: Right  Psychiatric Specialty Exam  Presentation General Appearance:  Disheveled  Eye Contact: Good  Speech: Clear and Coherent  Speech Volume: Normal  Handedness: Right   Mood and Affect  Mood: Euthymic  Affect: Congruent   Thought Process  Thought Processes: Coherent  Descriptions of Associations:Intact  Orientation:Full (Time, Place and Person)  Thought Content:WDL  Diagnosis of Schizophrenia or Schizoaffective disorder in past: No   Hallucinations:Hallucinations: None  Ideas of Reference:None  Suicidal Thoughts:Suicidal Thoughts:  No  Homicidal Thoughts:Homicidal Thoughts: No   Sensorium  Memory: Immediate Good; Recent Fair; Remote Fair  Judgment: Fair  Insight: Good   Executive Functions  Concentration: Good  Attention Span: Good  Recall: Good  Fund of  Knowledge: Good  Language: Good   Psychomotor Activity  Psychomotor Activity: Psychomotor Activity: Normal   Assets  Assets: Communication Skills; Desire for Improvement; Physical Health   Sleep  Sleep: Sleep: Fair Number of Hours of Sleep: 6   Nutritional Assessment (For OBS and FBC admissions only) Has the patient had a weight loss or gain of 10 pounds or more in the last 3 months?: No Has the patient had a decrease in food intake/or appetite?: No Does the patient have dental problems?: No Does the patient have eating habits or behaviors that may be indicators of an eating disorder including binging or inducing vomiting?: No Has the patient recently lost weight without trying?: 0 Has the patient been eating poorly because of a decreased appetite?: 0 Malnutrition Screening Tool Score: 0    Physical Exam Vitals and nursing note reviewed.  Constitutional:      General: He is not in acute distress.    Appearance: He is well-developed. He is not ill-appearing or diaphoretic.  HENT:     Head: Normocephalic and atraumatic.  Eyes:     Conjunctiva/sclera: Conjunctivae normal.  Cardiovascular:     Rate and Rhythm: Tachycardia present.     Heart sounds: No murmur heard. Pulmonary:     Effort: Pulmonary effort is normal. No respiratory distress.     Breath sounds: Normal breath sounds.  Abdominal:     Palpations: Abdomen is soft.     Tenderness: There is no abdominal tenderness.  Musculoskeletal:        General: Normal range of motion.     Cervical back: Normal range of motion and neck supple.  Skin:    General: Skin is warm and dry.     Capillary Refill: Capillary refill takes less than 2 seconds.  Neurological:     Mental Status: He is alert and oriented to person, place, and time.  Psychiatric:        Attention and Perception: Attention and perception normal.        Mood and Affect: Mood normal.        Speech: Speech normal.        Behavior: Behavior normal.  Behavior is cooperative.        Thought Content: Thought content normal.        Cognition and Memory: Cognition normal.    Review of Systems  Constitutional: Negative.   HENT: Negative.    Eyes: Negative.   Respiratory: Negative.    Cardiovascular: Negative.   Gastrointestinal: Negative.   Genitourinary: Negative.   Musculoskeletal: Negative.   Skin: Negative.   Neurological: Negative.   Endo/Heme/Allergies: Negative.   Psychiatric/Behavioral:  Positive for substance abuse. The patient is nervous/anxious.     Blood pressure (!) 146/98, pulse (!) 115, temperature 98.8 F (37.1 C), temperature source Oral, resp. rate 20, SpO2 98 %. There is no height or weight on file to calculate BMI.  Past Psychiatric History:  f substance use disorder, alcohol dependency, and depression  Is the patient at risk to self? No  Has the patient been a risk to self in the past 6 months? No .    Has the patient been a risk to self within the distant past? Yes   Is the patient a risk  to others? No   Has the patient been a risk to others in the past 6 months? No   Has the patient been a risk to others within the distant past? No   Past Medical History:  Past Medical History:  Diagnosis Date   Asthma    No past surgical history on file.  Family History:  Family History  Problem Relation Age of Onset   Hypertension Father     Social History:  Social History   Socioeconomic History   Marital status: Significant Other    Spouse name: Not on file   Number of children: Not on file   Years of education: Not on file   Highest education level: Not on file  Occupational History   Not on file  Tobacco Use   Smoking status: Every Day    Packs/day: 0.50    Years: 10.00    Total pack years: 5.00    Types: Cigarettes   Smokeless tobacco: Never  Vaping Use   Vaping Use: Unknown  Substance and Sexual Activity   Alcohol use: Yes    Alcohol/week: 4.0 standard drinks of alcohol    Types: 4 Cans  of beer per week    Comment: 4-5 cans a week   Drug use: Yes    Types: Marijuana    Comment: 2 weeks ago   Sexual activity: Yes    Birth control/protection: Condom  Other Topics Concern   Not on file  Social History Narrative   ** Merged History Encounter **       Social Determinants of Health   Financial Resource Strain: Unknown (03/28/2019)   Overall Financial Resource Strain (CARDIA)    Difficulty of Paying Living Expenses: Patient refused  Food Insecurity: Unknown (03/28/2019)   Hunger Vital Sign    Worried About Running Out of Food in the Last Year: Patient refused    [place] in the Last Year: Patient refused  Transportation Needs: Unknown (03/28/2019)   [place] - Transportation    Lack of Transportation (Medical): Patient refused    Lack of Transportation (Non-Medical): Patient refused  Physical Activity: Unknown (03/28/2019)   Exercise Vital Sign    Days of Exercise per Week: Patient refused    Minutes of Exercise per Session: Patient refused  Stress: Stress Concern Present (03/28/2019)   [employer] of Stress : Very much  Social Connections: Unknown (03/28/2019)   Social Connection and Isolation Panel [NHANES]    Frequency of Communication with Friends and Family: Patient refused    Frequency of Social Gatherings with Friends and Family: Patient refused    Attends Religious Services: Patient refused    Active Member of Clubs or Organizations: Patient refused    Attends Archivist Meetings: Patient refused    Marital Status: Patient refused  Intimate Partner Violence: Unknown (03/28/2019)   Humiliation, Afraid, Rape, and Kick questionnaire    Fear of Current or Ex-Partner: Patient refused    Emotionally Abused: Patient refused    Physically Abused: Patient refused    Sexually Abused: Patient refused    SDOH:  SDOH Screenings   Food Insecurity: Unknown (03/28/2019)   Transportation Needs: Unknown (03/28/2019)  Alcohol Screen: Medium Risk (07/12/2019)  Depression (PHQ2-9): Low Risk  (04/20/2022)  Recent Concern: Depression (PHQ2-9) - High Risk (03/20/2022)  Financial Resource Strain: Unknown (03/28/2019)  Physical Activity: Unknown (03/28/2019)  Social Connections: Unknown (03/28/2019)  Stress: Stress Concern Present (  03/28/2019)  Tobacco Use: High Risk (08/16/2021)    Last Labs:  Admission on 04/20/2022  Component Date Value Ref Range Status   SARS Coronavirus 2 by RT PCR 04/20/2022 NEGATIVE  NEGATIVE Final   Comment: (NOTE) SARS-CoV-2 target nucleic acids are NOT DETECTED.  The SARS-CoV-2 RNA is generally detectable in upper respiratory specimens during the acute phase of infection. The lowest concentration of SARS-CoV-2 viral copies this assay can detect is 138 copies/mL. A negative result does not preclude SARS-Cov-2 infection and should not be used as the sole basis for treatment or other patient management decisions. A negative result may occur with  improper specimen collection/handling, submission of specimen other than nasopharyngeal swab, presence of viral mutation(s) within the areas targeted by this assay, and inadequate number of viral copies(<138 copies/mL). A negative result must be combined with clinical observations, patient history, and epidemiological information. The expected result is Negative.  Fact Sheet for Patients:  EntrepreneurPulse.com.au  Fact Sheet for Healthcare Providers:  IncredibleEmployment.be  This test is no                          t yet approved or cleared by the [place] FDA and  has been authorized for detection and/or diagnosis of SARS-CoV-2 by FDA under an Emergency Use Authorization (EUA). This EUA will remain  in effect (meaning this test can be used) for the duration of the COVID-19 declaration under Section 564(b)(1) of the Act, 21 U.S.C.section  360bbb-3(b)(1), unless the authorization is terminated  or revoked sooner.       Influenza A by PCR 04/20/2022 NEGATIVE  NEGATIVE Final   Influenza B by PCR 04/20/2022 NEGATIVE  NEGATIVE Final   Comment: (NOTE) The Xpert Xpress SARS-CoV-2/FLU/RSV plus assay is intended as an aid in the diagnosis of influenza from Nasopharyngeal swab specimens and should not be used as a sole basis for treatment. Nasal washings and aspirates are unacceptable for Xpert Xpress SARS-CoV-2/FLU/RSV testing.  Fact Sheet for Patients: EntrepreneurPulse.com.au  Fact Sheet for Healthcare Providers: IncredibleEmployment.be  This test is not yet approved or cleared by the [place] FDA and has been authorized for detection and/or diagnosis of SARS-CoV-2 by FDA under an Emergency Use Authorization (EUA). This EUA will remain in effect (meaning this test can be used) for the duration of the COVID-19 declaration under Section 564(b)(1) of the Act, 21 U.S.C. section 360bbb-3(b)(1), unless the authorization is terminated or revoked.  Performed at [facility], [place] [address], [place], [place] [address]    WBC 04/20/2022 4.6  4.0 - 10.5 K/uL Final   RBC 04/20/2022 3.89 (L)  4.22 - 5.81 MIL/uL Final   Hemoglobin 04/20/2022 13.0  13.0 - 17.0 g/dL Final   HCT 04/20/2022 36.7 (L)  39.0 - 52.0 % Final   MCV 04/20/2022 94.3  80.0 - 100.0 fL Final   MCH 04/20/2022 33.4  26.0 - 34.0 pg Final   MCHC 04/20/2022 35.4  30.0 - 36.0 g/dL Final   RDW 04/20/2022 13.0  11.5 - 15.5 % Final   Platelets 04/20/2022 200  150 - 400 K/uL Final   nRBC 04/20/2022 0.0  0.0 - 0.2 % Final   Neutrophils Relative % 04/20/2022 57  % Final   Neutro Abs 04/20/2022 2.6  1.7 - 7.7 K/uL Final   Lymphocytes Relative 04/20/2022 35  % Final   Lymphs Abs 04/20/2022 1.6  0.7 - 4.0 K/uL Final   Monocytes Relative 04/20/2022 7  %  Final   Monocytes Absolute 04/20/2022 0.3  0.1 - 1.0 K/uL Final    Eosinophils Relative 04/20/2022 1  % Final   Eosinophils Absolute 04/20/2022 0.0  0.0 - 0.5 K/uL Final   Basophils Relative 04/20/2022 0  % Final   Basophils Absolute 04/20/2022 0.0  0.0 - 0.1 K/uL Final   Immature Granulocytes 04/20/2022 0  % Final   Abs Immature Granulocytes 04/20/2022 0.02  0.00 - 0.07 K/uL Final   Performed at [facility], [place] [address] [place], [place] [address]   Sodium 04/20/2022 140  135 - 145 mmol/L Final   Potassium 04/20/2022 4.1  3.5 - 5.1 mmol/L Final   Chloride 04/20/2022 105  98 - 111 mmol/L Final   CO2 04/20/2022 21 (L)  22 - 32 mmol/L Final   Glucose, Bld 04/20/2022 83  70 - 99 mg/dL Final   Glucose reference range applies only to samples taken after fasting for at least 8 hours.   BUN 04/20/2022 <5 (L)  6 - 20 mg/dL Final   Creatinine, Ser 04/20/2022 0.79  0.61 - 1.24 mg/dL Final   Calcium 04/20/2022 8.8 (L)  8.9 - 10.3 mg/dL Final   Total Protein 04/20/2022 5.3 (L)  6.5 - 8.1 g/dL Final   Albumin 04/20/2022 2.9 (L)  3.5 - 5.0 g/dL Final   AST 04/20/2022 84 (H)  15 - 41 U/L Final   ALT 04/20/2022 50 (H)  0 - 44 U/L Final   Alkaline Phosphatase 04/20/2022 57  38 - 126 U/L Final   Total Bilirubin 04/20/2022 0.7  0.3 - 1.2 mg/dL Final   GFR, Estimated 04/20/2022 >60  >60 mL/min Final   Comment: (NOTE) Calculated using the CKD-EPI Creatinine Equation (2021)    Anion gap 04/20/2022 14  5 - 15 Final   Performed at [place] [address] [place], [place] [address]   Alcohol, Ethyl (B) 04/20/2022 302 (HH)  <10 mg/dL Final   Comment: CRITICAL RESULT CALLED TO, READ BACK BY AND VERIFIED WITH MARGARET CINDERELLA AT [facility] 04/21/22 0036 M KOROLESKI (NOTE) Lowest detectable limit for serum alcohol is 10 mg/dL.  For medical purposes only. Performed at [facility], [place] [address] [place], [place] [address]    Cholesterol 04/20/2022 206 (H)  0 - 200 mg/dL Final   Triglycerides 04/20/2022 64  <150 mg/dL Final   HDL 04/20/2022 123   >40 mg/dL Final   Total CHOL/HDL Ratio 04/20/2022 1.7  RATIO Final   VLDL 04/20/2022 13  0 - 40 mg/dL Final   LDL Cholesterol 04/20/2022 70  0 - 99 mg/dL Final   Comment:        Total Cholesterol/HDL:CHD Risk Coronary Heart Disease Risk Table                     Men   Women  1/2 Average Risk   3.4   3.3  Average Risk       5.0   4.4  2 X Average Risk   9.6   7.1  3 X Average Risk  23.4   11.0        Use the calculated Patient Ratio above and the CHD Risk Table to determine the patient's CHD Risk.        ATP III CLASSIFICATION (LDL):  <100     mg/dL   Optimal  100-129  mg/dL   Near or Above  Optimal  130-159  mg/dL   Borderline  160-189  mg/dL   High  >190     mg/dL   Very High Performed at [place] [address], [place], [place] [address]    TSH 04/20/2022 0.771  0.350 - 4.500 uIU/mL Final   Comment: Performed by a 3rd Generation assay with a functional sensitivity of <=0.01 uIU/mL. Performed at [facility], [place] [address] [place], [place] [address]    SARSCOV2ONAVIRUS 2 AG 04/20/2022 NEGATIVE  NEGATIVE Final   Comment: (NOTE) SARS-CoV-2 antigen NOT DETECTED.   Negative results are presumptive.  Negative results do not preclude SARS-CoV-2 infection and should not be used as the sole basis for treatment or other patient management decisions, including infection  control decisions, particularly in the presence of clinical signs and  symptoms consistent with COVID-19, or in those who have been in contact with the virus.  Negative results must be combined with clinical observations, patient history, and epidemiological information. The expected result is Negative.  Fact Sheet for Patients: HandmadeRecipes.com.cy  Fact Sheet for Healthcare Providers: FuneralLife.at  This test is not yet approved or cleared by the [place] FDA and  has been authorized for detection and/or diagnosis of  SARS-CoV-2 by FDA under an Emergency Use Authorization (EUA).  This EUA will remain in effect (meaning this test can be used) for the duration of  the COV                          ID-19 declaration under Section 564(b)(1) of the Act, 21 U.S.C. section 360bbb-3(b)(1), unless the authorization is terminated or revoked sooner.    Admission on 03/19/2022, Discharged on 03/20/2022  Component Date Value Ref Range Status   SARS Coronavirus 2 by RT PCR 03/19/2022 NEGATIVE  NEGATIVE Final   Comment: (NOTE) SARS-CoV-2 target nucleic acids are NOT DETECTED.  The SARS-CoV-2 RNA is generally detectable in upper respiratory specimens during the acute phase of infection. The lowest concentration of SARS-CoV-2 viral copies this assay can detect is 138 copies/mL. A negative result does not preclude SARS-Cov-2 infection and should not be used as the sole basis for treatment or other patient management decisions. A negative result may occur with  improper specimen collection/handling, submission of specimen other than nasopharyngeal swab, presence of viral mutation(s) within the areas targeted by this assay, and inadequate number of viral copies(<138 copies/mL). A negative result must be combined with clinical observations, patient history, and epidemiological information. The expected result is Negative.  Fact Sheet for Patients:  EntrepreneurPulse.com.au  Fact Sheet for Healthcare Providers:  IncredibleEmployment.be  This test is no                          t yet approved or cleared by the [place] FDA and  has been authorized for detection and/or diagnosis of SARS-CoV-2 by FDA under an Emergency Use Authorization (EUA). This EUA will remain  in effect (meaning this test can be used) for the duration of the COVID-19 declaration under Section 564(b)(1) of the Act, 21 U.S.C.section 360bbb-3(b)(1), unless the authorization is terminated  or revoked sooner.        Influenza A by PCR 03/19/2022 NEGATIVE  NEGATIVE Final   Influenza B by PCR 03/19/2022 NEGATIVE  NEGATIVE Final   Comment: (NOTE) The Xpert Xpress SARS-CoV-2/FLU/RSV plus assay is intended as an aid in the diagnosis of influenza from Nasopharyngeal swab  specimens and should not be used as a sole basis for treatment. Nasal washings and aspirates are unacceptable for Xpert Xpress SARS-CoV-2/FLU/RSV testing.  Fact Sheet for Patients: EntrepreneurPulse.com.au  Fact Sheet for Healthcare Providers: IncredibleEmployment.be  This test is not yet approved or cleared by the [place] FDA and has been authorized for detection and/or diagnosis of SARS-CoV-2 by FDA under an Emergency Use Authorization (EUA). This EUA will remain in effect (meaning this test can be used) for the duration of the COVID-19 declaration under Section 564(b)(1) of the Act, 21 U.S.C. section 360bbb-3(b)(1), unless the authorization is terminated or revoked.  Performed at [facility], [place] [address] [place], [place] [address]    Alcohol, Ethyl (B) 03/19/2022 318 (HH)  <10 mg/dL Final   Comment: CRITICAL RESULT CALLED TO, READ BACK BY AND VERIFIED WITH BOBBY BROOKS RN 03/20/22 0058 M KOROLESKI (NOTE) Lowest detectable limit for serum alcohol is 10 mg/dL.  For medical purposes only. Performed at [facility], [place] [address] [place], [place] [address]    WBC 03/19/2022 3.9 (L)  4.0 - 10.5 K/uL Final   RBC 03/19/2022 4.48  4.22 - 5.81 MIL/uL Final   Hemoglobin 03/19/2022 14.9  13.0 - 17.0 g/dL Final   HCT 03/19/2022 42.9  39.0 - 52.0 % Final   MCV 03/19/2022 95.8  80.0 - 100.0 fL Final   MCH 03/19/2022 33.3  26.0 - 34.0 pg Final   MCHC 03/19/2022 34.7  30.0 - 36.0 g/dL Final   RDW 03/19/2022 13.1  11.5 - 15.5 % Final   Platelets 03/19/2022 183  150 - 400 K/uL Final   nRBC 03/19/2022 0.0  0.0 - 0.2 % Final   Neutrophils Relative % 03/19/2022 42  %  Final   Neutro Abs 03/19/2022 1.6 (L)  1.7 - 7.7 K/uL Final   Lymphocytes Relative 03/19/2022 47  % Final   Lymphs Abs 03/19/2022 1.8  0.7 - 4.0 K/uL Final   Monocytes Relative 03/19/2022 9  % Final   Monocytes Absolute 03/19/2022 0.4  0.1 - 1.0 K/uL Final   Eosinophils Relative 03/19/2022 1  % Final   Eosinophils Absolute 03/19/2022 0.1  0.0 - 0.5 K/uL Final   Basophils Relative 03/19/2022 1  % Final   Basophils Absolute 03/19/2022 0.0  0.0 - 0.1 K/uL Final   Immature Granulocytes 03/19/2022 0  % Final   Abs Immature Granulocytes 03/19/2022 0.01  0.00 - 0.07 K/uL Final   Performed at [facility], [place] [address], [place], [place] [address]   Sodium 03/19/2022 145  135 - 145 mmol/L Final   Potassium 03/19/2022 3.9  3.5 - 5.1 mmol/L Final   Chloride 03/19/2022 113 (H)  98 - 111 mmol/L Final   CO2 03/19/2022 23  22 - 32 mmol/L Final   Glucose, Bld 03/19/2022 77  70 - 99 mg/dL Final   Glucose reference range applies only to samples taken after fasting for at least 8 hours.   BUN 03/19/2022 7  6 - 20 mg/dL Final   Creatinine, Ser 03/19/2022 0.85  0.61 - 1.24 mg/dL Final   Calcium 03/19/2022 8.2 (L)  8.9 - 10.3 mg/dL Final   Total Protein 03/19/2022 4.9 (L)  6.5 - 8.1 g/dL Final   Albumin 03/19/2022 2.6 (L)  3.5 - 5.0 g/dL Final   AST 03/19/2022 48 (H)  15 - 41 U/L Final   ALT 03/19/2022 56 (H)  0 - 44 U/L Final   Alkaline Phosphatase 03/19/2022 53  38 - 126 U/L Final   Total Bilirubin 03/19/2022 0.2 (L)  0.3 - 1.2 mg/dL Final   GFR, Estimated 03/19/2022 >60  >60 mL/min Final   Comment: (NOTE) Calculated using the CKD-EPI Creatinine Equation (2021)    Anion gap 03/19/2022 9  5 - 15 Final   Performed at [place] [address] [place],  [address]   TSH 03/19/2022 0.734  0.350 - 4.500 uIU/mL Final   Comment: Performed by a 3rd Generation assay with a functional sensitivity of <=0.01 uIU/mL. Performed at [facility], [place] [address], [place], [place]  [address]    POC Amphetamine UR 03/19/2022 None Detected  NONE DETECTED (Cut Off Level 1000 ng/mL) Final   POC Secobarbital (BAR) 03/19/2022 None Detected  NONE DETECTED (Cut Off Level 300 ng/mL) Final   POC Buprenorphine (BUP) 03/19/2022 None Detected  NONE DETECTED (Cut Off Level 10 ng/mL) Final   POC Oxazepam (BZO) 03/19/2022 None Detected  NONE DETECTED (Cut Off Level 300 ng/mL) Final   POC Cocaine UR 03/19/2022 None Detected  NONE DETECTED (Cut Off Level 300 ng/mL) Final   POC Methamphetamine UR 03/19/2022 None Detected  NONE DETECTED (Cut Off Level 1000 ng/mL) Final   POC Morphine 03/19/2022 None Detected  NONE DETECTED (Cut Off Level 300 ng/mL) Final   POC Methadone UR 03/19/2022 None Detected  NONE DETECTED (Cut Off Level 300 ng/mL) Final   POC Oxycodone UR 03/19/2022 None Detected  NONE DETECTED (Cut Off Level 100 ng/mL) Final   POC Marijuana UR 03/19/2022 Positive (A)  NONE DETECTED (Cut Off Level 50 ng/mL) Final   SARSCOV2ONAVIRUS 2 AG 03/20/2022 NEGATIVE  NEGATIVE Final   Comment: (NOTE) SARS-CoV-2 antigen NOT DETECTED.   Negative results are presumptive.  Negative results do not preclude SARS-CoV-2 infection and should not be used as the sole basis for treatment or other patient management decisions, including infection  control decisions, particularly in the presence of clinical signs and  symptoms consistent with COVID-19, or in those who have been in contact with the virus.  Negative results must be combined with clinical observations, patient history, and epidemiological information. The expected result is Negative.  Fact Sheet for Patients: HandmadeRecipes.com.cy  Fact Sheet for Healthcare Providers: FuneralLife.at  This test is not yet approved or cleared by the [place] FDA and  has been authorized for detection and/or diagnosis of SARS-CoV-2 by FDA under an Emergency Use Authorization (EUA).  This EUA will remain in  effect (meaning this test can be used) for the duration of  the COV                          ID-19 declaration under Section 564(b)(1) of the Act, 21 U.S.C. section 360bbb-3(b)(1), unless the authorization is terminated or revoked sooner.    Admission on 03/11/2022, Discharged on 03/12/2022  Component Date Value Ref Range Status   SARS Coronavirus 2 by RT PCR 03/11/2022 NEGATIVE  NEGATIVE Final   Comment: (NOTE) SARS-CoV-2 target nucleic acids are NOT DETECTED.  The SARS-CoV-2 RNA is generally detectable in upper respiratory specimens during the acute phase of infection. The lowest concentration of SARS-CoV-2 viral copies this assay can detect is 138 copies/mL. A negative result does not preclude SARS-Cov-2 infection and should not be used as the sole basis for treatment or other patient management decisions. A negative result may occur with  improper specimen collection/handling, submission of specimen other than nasopharyngeal swab, presence of viral  mutation(s) within the areas targeted by this assay, and inadequate number of viral copies(<138 copies/mL). A negative result must be combined with clinical observations, patient history, and epidemiological information. The expected result is Negative.  Fact Sheet for Patients:  EntrepreneurPulse.com.au  Fact Sheet for Healthcare Providers:  IncredibleEmployment.be  This test is no                          t yet approved or cleared by the [place] FDA and  has been authorized for detection and/or diagnosis of SARS-CoV-2 by FDA under an Emergency Use Authorization (EUA). This EUA will remain  in effect (meaning this test can be used) for the duration of the COVID-19 declaration under Section 564(b)(1) of the Act, 21 U.S.C.section 360bbb-3(b)(1), unless the authorization is terminated  or revoked sooner.       Influenza A by PCR 03/11/2022 NEGATIVE  NEGATIVE Final   Influenza B by PCR  03/11/2022 NEGATIVE  NEGATIVE Final   Comment: (NOTE) The Xpert Xpress SARS-CoV-2/FLU/RSV plus assay is intended as an aid in the diagnosis of influenza from Nasopharyngeal swab specimens and should not be used as a sole basis for treatment. Nasal washings and aspirates are unacceptable for Xpert Xpress SARS-CoV-2/FLU/RSV testing.  Fact Sheet for Patients: EntrepreneurPulse.com.au  Fact Sheet for Healthcare Providers: IncredibleEmployment.be  This test is not yet approved or cleared by the [place] FDA and has been authorized for detection and/or diagnosis of SARS-CoV-2 by FDA under an Emergency Use Authorization (EUA). This EUA will remain in effect (meaning this test can be used) for the duration of the COVID-19 declaration under Section 564(b)(1) of the Act, 21 U.S.C. section 360bbb-3(b)(1), unless the authorization is terminated or revoked.  Performed at [facility], [place] [address] [place], [place] [address]    WBC 03/11/2022 5.4  4.0 - 10.5 K/uL Final   RBC 03/11/2022 4.53  4.22 - 5.81 MIL/uL Final   Hemoglobin 03/11/2022 15.5  13.0 - 17.0 g/dL Final   HCT 03/11/2022 42.9  39.0 - 52.0 % Final   MCV 03/11/2022 94.7  80.0 - 100.0 fL Final   MCH 03/11/2022 34.2 (H)  26.0 - 34.0 pg Final   MCHC 03/11/2022 36.1 (H)  30.0 - 36.0 g/dL Final   RDW 03/11/2022 12.9  11.5 - 15.5 % Final   Platelets 03/11/2022 277  150 - 400 K/uL Final   nRBC 03/11/2022 0.0  0.0 - 0.2 % Final   Neutrophils Relative % 03/11/2022 49  % Final   Neutro Abs 03/11/2022 2.7  1.7 - 7.7 K/uL Final   Lymphocytes Relative 03/11/2022 41  % Final   Lymphs Abs 03/11/2022 2.2  0.7 - 4.0 K/uL Final   Monocytes Relative 03/11/2022 7  % Final   Monocytes Absolute 03/11/2022 0.4  0.1 - 1.0 K/uL Final   Eosinophils Relative 03/11/2022 2  % Final   Eosinophils Absolute 03/11/2022 0.1  0.0 - 0.5 K/uL Final   Basophils Relative 03/11/2022 1  % Final   Basophils Absolute  03/11/2022 0.0  0.0 - 0.1 K/uL Final   Immature Granulocytes 03/11/2022 0  % Final   Abs Immature Granulocytes 03/11/2022 0.02  0.00 - 0.07 K/uL Final   Performed at [facility], [place] [address], [place], [place] [address]   Sodium 03/11/2022 144  135 - 145 mmol/L Final   Potassium 03/11/2022 3.7  3.5 - 5.1 mmol/L Final   Chloride 03/11/2022 113 (H)  98 - 111 mmol/L Final   CO2 03/11/2022 21 (L)  22 - 32 mmol/L Final   Glucose, Bld 03/11/2022 99  70 - 99 mg/dL Final   Glucose reference range applies only to samples taken after fasting for at least 8 hours.   BUN 03/11/2022 10  6 - 20 mg/dL Final   Creatinine, Ser 03/11/2022 0.91  0.61 - 1.24 mg/dL Final   Calcium 03/11/2022 8.6 (L)  8.9 - 10.3 mg/dL Final   Total Protein 03/11/2022 5.9 (L)  6.5 - 8.1 g/dL Final   Albumin 03/11/2022 3.2 (L)  3.5 - 5.0 g/dL Final   AST 03/11/2022 27  15 - 41 U/L Final   ALT 03/11/2022 44  0 - 44 U/L Final   Alkaline Phosphatase 03/11/2022 56  38 - 126 U/L Final   Total Bilirubin 03/11/2022 0.3  0.3 - 1.2 mg/dL Final   GFR, Estimated 03/11/2022 >60  >60 mL/min Final   Comment: (NOTE) Calculated using the CKD-EPI Creatinine Equation (2021)    Anion gap 03/11/2022 10  5 - 15 Final   Performed at [place] [facility] [place] [address] [place], [place] [address]   Hgb A1c MFr Bld 03/11/2022 4.9  4.8 - 5.6 % Final   Comment: (NOTE) Pre diabetes:          5.7%-6.4%  Diabetes:              >6.4%  Glycemic control for   <7.0% adults with diabetes    Mean Plasma Glucose 03/11/2022 93.93  mg/dL Final   Performed at [facility], [place] [address] [place], [place] [address]   Magnesium 03/11/2022 2.3  1.7 - 2.4 mg/dL Final   Performed at [place] [address], [place], [place] [address]   Alcohol, Ethyl (B) 03/11/2022 269 (H)  <10 mg/dL Final   Comment: (NOTE) Lowest detectable limit for serum alcohol is 10 mg/dL.  For medical purposes only. Performed at [facility], [place] [address], [place], [place] [address]    Cholesterol 03/11/2022 157  0 - 200 mg/dL Final   Triglycerides 03/11/2022 548 (H)  <150 mg/dL Final   HDL 03/11/2022 60  >40 mg/dL Final   Total CHOL/HDL Ratio 03/11/2022 2.6  RATIO Final   VLDL 03/11/2022 UNABLE TO CALCULATE IF TRIGLYCERIDE OVER 400 mg/dL  0 - 40 mg/dL Final   LDL Cholesterol 03/11/2022 UNABLE TO CALCULATE IF TRIGLYCERIDE OVER 400 mg/dL  0 - 99 mg/dL Final   Comment:        Total Cholesterol/HDL:CHD Risk Coronary Heart Disease Risk Table                     Men   Women  1/2 Average Risk   3.4   3.3  Average Risk       5.0   4.4  2 X Average Risk   9.6   7.1  3 X Average Risk  23.4   11.0        Use the calculated Patient Ratio above and the CHD Risk Table to determine the patient's CHD Risk.        ATP III CLASSIFICATION (LDL):  <100     mg/dL   Optimal  100-129  mg/dL   Near or Above                    Optimal  130-159  mg/dL   Borderline  160-189  mg/dL   High  >190     mg/dL   Very High Performed at [facility] [address], [place], [place] [address]    TSH 03/11/2022 1.019  0.350 - 4.500 uIU/mL Final   Comment: Performed by a 3rd Generation assay with a functional sensitivity of <=0.01 uIU/mL. Performed at [facility], [place] [address], [place], [place] [address]    RPR Ser Ql 03/11/2022 NON REACTIVE  NON REACTIVE Final   Performed at [facility], [place] [address] [place], [place] [address]   Chlamydia 03/11/2022 Negative   Final   Neisseria Gonorrhea 03/11/2022 Negative   Final   Comment 03/11/2022 Normal Reference [place] Chlamydia - Negative   Final   Comment 03/11/2022 Normal Reference Range Neisseria Gonorrhea - Negative   Final   POC Amphetamine UR 03/11/2022 None Detected  NONE DETECTED (Cut Off Level 1000 ng/mL) Final   POC Secobarbital (BAR) 03/11/2022 None Detected  NONE DETECTED (Cut Off Level 300 ng/mL) Final   POC Buprenorphine (BUP) 03/11/2022 None Detected  NONE DETECTED (Cut Off  Level 10 ng/mL) Final   POC Oxazepam (BZO) 03/11/2022 None Detected  NONE DETECTED (Cut Off Level 300 ng/mL) Final   POC Cocaine UR 03/11/2022 None Detected  NONE DETECTED (Cut Off Level 300 ng/mL) Final   POC Methamphetamine UR 03/11/2022 Positive (A)  NONE DETECTED (Cut Off Level 1000 ng/mL) Final   POC Morphine 03/11/2022 None Detected  NONE DETECTED (Cut Off Level 300 ng/mL) Final   POC Methadone UR 03/11/2022 None Detected  NONE DETECTED (Cut Off Level 300 ng/mL) Final   POC Oxycodone UR 03/11/2022 None Detected  NONE DETECTED (Cut Off Level 100 ng/mL) Final   POC Marijuana UR 03/11/2022 Positive (A)  NONE DETECTED (Cut Off Level 50 ng/mL) Final   SARS Coronavirus 2 Ag 03/11/2022 Negative  Negative Final   Direct LDL 03/11/2022 49  0 - 99 mg/dL Final   Performed at [facility], [place] [address], [place],  [address]    Allergies: Patient has no known allergies.  PTA Medications: (Not in a hospital admission)   Long Term Goals: Improvement in symptoms so as ready for discharge  Short Term Goals: Patient will verbalize feelings in meetings with treatment team members., Patient will attend at least of 50% of the groups daily., Pt will complete the PHQ9 on admission, day 3 and discharge., Patient will participate in completing the [place], Patient will score a low risk of violence for 24 hours prior to discharge, and Patient will take medications as prescribed daily.  Medical Decision Making  Patient will be admitted to [facility] for management of alcohol withdrawal symptoms and stabilization.   Lab Orders         Resp Panel by RT-PCR (Flu A&B, Covid) Anterior Nasal Swab         CBC with Differential/Platelet         Comprehensive metabolic panel         Hemoglobin A1c         Ethanol         Lipid panel         TSH         POCT Urine Drug Screen - (I-Screen)         POC SARS Coronavirus 2 Ag     Initiate CIWA protocol -lorazepam 1 mg every 6  hours prn for CIWA >10 -thiamine 100 mg daily for nutritional  supplementation -hydroxyzine 25 mg every 6 hours prn for anxiety, CIWA < or = 10 -ondansetron 4 mg ODT every 6 hours prn nausea/vomiting -loperamide 2-4 mg capsule prn diarrhea or loose stools    Recommendations  Based on my evaluation the patient does not appear to have an emergency medical condition.  Ophelia Shoulder, NP 04/21/22  4:07 AM

## 2022-04-20 NOTE — ED Triage Notes (Signed)
Pt presents to [facility] voluntarily, unaccompanied at this time for alcohol detox. He reports drinking (2) 40oz beers and marijuana prior to coming to this facility. Pt denies SI/HI and AVH.
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
## 2022-04-21 ENCOUNTER — Encounter (HOSPITAL_COMMUNITY): Payer: Self-pay

## 2022-04-21 ENCOUNTER — Encounter (HOSPITAL_COMMUNITY): Payer: Self-pay | Admitting: Psychiatry

## 2022-04-21 DIAGNOSIS — F1020 Alcohol dependence, uncomplicated: Secondary | ICD-10-CM | POA: Diagnosis not present

## 2022-04-21 DIAGNOSIS — F1994 Other psychoactive substance use, unspecified with psychoactive substance-induced mood disorder: Secondary | ICD-10-CM | POA: Diagnosis present

## 2022-04-21 DIAGNOSIS — Z1152 Encounter for screening for COVID-19: Secondary | ICD-10-CM | POA: Diagnosis not present

## 2022-04-21 LAB — COMPREHENSIVE METABOLIC PANEL
ALT: 50 U/L — ABNORMAL HIGH (ref 0–44)
AST: 84 U/L — ABNORMAL HIGH (ref 15–41)
Albumin: 2.9 g/dL — ABNORMAL LOW (ref 3.5–5.0)
Alkaline Phosphatase: 57 U/L (ref 38–126)
Anion gap: 14 (ref 5–15)
BUN: <5 mg/dL — ABNORMAL LOW (ref 6–20)
CO2: 21 mmol/L — ABNORMAL LOW (ref 22–32)
Calcium: 8.8 mg/dL — ABNORMAL LOW (ref 8.9–10.3)
Chloride: 105 mmol/L (ref 98–111)
Creatinine, Ser: 0.79 mg/dL (ref 0.61–1.24)
GFR, Estimated: >60 mL/min (ref >60)
Glucose, Bld: 83 mg/dL (ref 70–99)
Potassium: 4.1 mmol/L (ref 3.5–5.1)
Sodium: 140 mmol/L (ref 135–145)
Total Bilirubin: 0.7 mg/dL (ref 0.3–1.2)
Total Protein: 5.3 g/dL — ABNORMAL LOW (ref 6.5–8.1)

## 2022-04-21 LAB — LIPID PANEL
Cholesterol: 206 mg/dL — ABNORMAL HIGH (ref 0–200)
HDL: 123 mg/dL (ref >40)
LDL Cholesterol: 70 mg/dL (ref 0–99)
Total CHOL/HDL Ratio: 1.7 RATIO
Triglycerides: 64 mg/dL (ref <150)
VLDL: 13 mg/dL (ref 0–40)

## 2022-04-21 LAB — POCT URINE DRUG SCREEN - MANUAL ENTRY (I-SCREEN)
POC Amphetamine UR: NOT DETECTED
POC Buprenorphine (BUP): NOT DETECTED
POC Cocaine UR: NOT DETECTED
POC Marijuana UR: POSITIVE — AB
POC Methadone UR: NOT DETECTED
POC Methamphetamine UR: NOT DETECTED
POC Morphine: NOT DETECTED
POC Oxazepam (BZO): NOT DETECTED
POC Oxycodone UR: NOT DETECTED
POC Secobarbital (BAR): NOT DETECTED

## 2022-04-21 LAB — RESP PANEL BY RT-PCR (FLU A&B, COVID) ARPGX2
Influenza A by PCR: NEGATIVE
Influenza B by PCR: NEGATIVE
SARS Coronavirus 2 by RT PCR: NEGATIVE

## 2022-04-21 LAB — ETHANOL: Alcohol, Ethyl (B): 302 mg/dL (ref <10)

## 2022-04-21 LAB — TSH: TSH: 0.771 u[IU]/mL (ref 0.350–4.500)

## 2022-04-21 NOTE — ED Provider Notes (Signed)
Per Dr. Gasper Sells, pt's BAL is 302mg /dl. She requested patient be moved from [facility] to [facility] for closer observation. This NP notified pt's nurse. CIWA protocol in place.

## 2022-04-21 NOTE — BH IP Treatment Plan (Signed)
Interdisciplinary Treatment and Diagnostic Plan Update  04/21/2022 Time of Session: 13:45PM Howard Adams MRN: [id number]  Diagnosis:  Final diagnoses:  Substance use disorder  Alcohol use disorder  Substance induced mood disorder (HCC)  Alcohol use disorder, severe, dependence (HCC)     Current Medications:  Current Facility-Administered Medications  Medication Dose Route Frequency Provider Last Rate Last Admin   acetaminophen (TYLENOL) tablet 650 mg  650 mg Oral Q6H PRN Ajibola, Ene A, NP       alum & mag hydroxide-simeth (MAALOX/MYLANTA) 200-200-20 MG/5ML suspension 30 mL  30 mL Oral Q4H PRN Ajibola, Ene A, NP       hydrOXYzine (ATARAX) tablet 25 mg  25 mg Oral Q6H PRN Ajibola, Ene A, NP       loperamide (IMODIUM) capsule 2-4 mg  2-4 mg Oral PRN Ajibola, Ene A, NP       LORazepam (ATIVAN) tablet 1 mg  1 mg Oral Q6H PRN Ajibola, Ene A, NP       magnesium hydroxide (MILK OF MAGNESIA) suspension 30 mL  30 mL Oral Daily PRN Ajibola, Ene A, NP       multivitamin with minerals tablet 1 tablet  1 tablet Oral Daily Ajibola, Ene A, NP   1 tablet at 04/21/22 0920   ondansetron (ZOFRAN-ODT) disintegrating tablet 4 mg  4 mg Oral Q6H PRN Ajibola, Ene A, NP       thiamine (VITAMIN B1) injection 100 mg  100 mg Intramuscular Once Ajibola, Ene A, NP       thiamine (VITAMIN B1) tablet 100 mg  100 mg Oral Daily Ajibola, Ene A, NP   100 mg at 04/21/22 0920   No current outpatient medications on file.   PTA Medications: Prior to Admission medications   Not on File    Patient Stressors: Financial difficulties   Legal issue   Marital or family conflict   Substance abuse    Patient Strengths: Capable of independent living   Treatment Modalities: Medication Management, Group therapy, Case management,  1 to 1 session with clinician, Psychoeducation, Recreational therapy.   Physician Treatment Plan for Primary and Secondary Diagnosis:  Final diagnoses:  Substance use disorder  Alcohol use  disorder  Substance induced mood disorder (HCC)  Alcohol use disorder, severe, dependence (HCC)   Long Term Goal(s): Improvement in symptoms so as ready for discharge  Short Term Goals: Patient will verbalize feelings in meetings with treatment team members. Patient will attend at least of 50% of the groups daily. Pt will complete the PHQ9 on admission, day 3 and discharge. Patient will participate in completing the [place] Suicide Severity Rating Scale Patient will score a low risk of violence for 24 hours prior to discharge Patient will take medications as prescribed daily.  Medication Management: Evaluate patient's response, side effects, and tolerance of medication regimen.  Therapeutic Interventions: 1 to 1 sessions, Unit Group sessions and Medication administration.  Evaluation of Outcomes: Progressing  LCSW Treatment Plan for Primary Diagnosis:  Final diagnoses:  Substance use disorder  Alcohol use disorder  Substance induced mood disorder (HCC)  Alcohol use disorder, severe, dependence (HCC)    Long Term Goal(s): Safe transition to appropriate next level of care at discharge.  Short Term Goals: Facilitate acceptance of mental health diagnosis and concerns through verbal commitment to aftercare plan and appointments at discharge., Patient will identify one social support prior to discharge to aid in patient's recovery., Patient will attend AA/NA groups as scheduled., Identify minimum of 2 triggers associated  with mental health/substance abuse issues with treatment team members., and Increase skills for wellness and recovery by attending 50% of scheduled groups.  Therapeutic Interventions: Assess for all discharge needs, 1 to 1 time with Child psychotherapist, Explore available resources and support systems, Assess for adequacy in community support network, Educate family and significant other(s) on suicide prevention, Complete Psychosocial Assessment, Interpersonal group  therapy.  Evaluation of Outcomes: Progressing   Progress in Treatment: Attending groups: Yes. Participating in groups: Yes. Taking medication as prescribed: Yes. Toleration medication: Yes. Family/Significant other contact made: No, will contact:  patient did not provide permission for LCSW to contact anyone for collateral.  Patient understands diagnosis: Yes. Discussing patient identified problems/goals with staff: Yes. Medical problems stabilized or resolved: Yes. Denies suicidal/homicidal ideation: Yes. Issues/concerns per patient self-inventory: Yes. Other: housing, substance use  New problem(s) identified: No, Describe:  other than reported at admission  New Short Term/Long Term Goal(s): Safe transition to appropriate next level of care at discharge, Engage patient in therapeutic group addressing interpersonal concerns. Engage patient in aftercare planning with referrals and resources, Increase ability to appropriately verbalize feelings, Facilitate acceptance of mental health diagnosis and concerns and Identify triggers associated with mental health/substance abuse issues.   Patient Goals:  Patient reports once stable, he would like to discharge with intensive outpatient services in place.   Discharge Plan or Barriers: TBD; LCSW will discuss with MD; Initially the patient reported that he would like to be referred to [facility] for residential. Patient then reported to this Clinician that he would rather stay at the [facility] for a week and discharge with IOP in place.   Reason for Continuation of Hospitalization: Other; describe alcohol use   Estimated Length of Stay: 3-5 days  Last 3 [place] Suicide Severity Risk Score: Flowsheet Row ED from 04/20/2022 in [facility] ED from 03/19/2022 in [facility] ED from 03/11/2022 in [facility]  C-SSRS RISK CATEGORY No Risk No Risk No Risk        Last PHQ 2/9 Scores:    04/20/2022   11:33 PM 03/20/2022    9:24 PM 03/20/2022    6:05 PM  Depression screen PHQ 2/9  Decreased Interest 0 0 0  Down, Depressed, Hopeless 0 0 3  PHQ - 2 Score 0 0 3  Altered sleeping 0 0 3  Tired, decreased energy 0 0 3  Change in appetite 0 0 3  Feeling bad or failure about yourself  0 0 3  Trouble concentrating 0 0 0  Moving slowly or fidgety/restless 0 0 0  Suicidal thoughts 0 0 0  PHQ-9 Score 0 0 15  Difficult doing work/chores  Not difficult at all Somewhat difficult    Scribe for Treatment Team: Lenny Pastel 04/21/2022 3:42 PM

## 2022-04-21 NOTE — Tx Team (Signed)
LCSW met with patient to assess current mood, affect, physical state, and inquire about needs/goals while here in [facility] and after discharge. Patient reports he presented to [facility] for alcohol detox. Patient reports drinking two 40 ounce beer and marijuana prior to admission. Patient reports he lives with his girlfriend, however due to his alcohol use they have been experiencing conflict. This appears to be the same presentation as when patient was admitted a month ago 03/20/22. Patient left voluntarily on last admission. Patient initially stated that his plan was to be admitted to [facility] at discharge. However, patient stopped LCSW in hallway and stated, he would prefer not to go to [facility] but rather stay at [facility] for a week and then go home. LCSW provided brief counseling to the patient regarding facility and length of stay. Patient reported he does not believe he needs residential placement at this time, stating he has to help his girlfriend with the kids. Patient is not motivated for treatment. Referral was sent to [facility] prior to this conversation. Patient explored if he could just do outpatient services here once discharged. LCSW will discuss plan with MD and follow up with patient to provide an update.      Lucius Conn, [facility] Ph: [phone]

## 2022-04-21 NOTE — ED Notes (Addendum)
Patient admitted to [facility] for alcohol detox.  A&Ox4. Independent with ADLS. Steady gait. Denies pain. Patient presents with tearful and depressed mood. Patient states he has been to [facility] 2x prior and right after discharging he goes and buys beer from the gas station down the street. Patient states he always goes immediately back to drinking and drinks 40oz beer bottles "all day everyday." Patient states having 3 biological children and 5 stepchildren totaling to 8 children he feels responsible for. Patient states he wants to do better for them and his girlfriend but states she is getting tired of him and is afraid that she is leaving him for good. Patient states he is drunk all the time and says and does "stupid things." Patient states he tends to urinate over himself and over the bed or couch when he's drunk and he is tired and disappointed in himself. Patient denies SI,HI, and A/V/H with no plan/intent but reports having tremors and feeling shaky. CIWA score 3. Patient is interested in receiving resources for continued support or programs for rehab. Patient reports smoking marijuana and denies any other drug use. Patient oriented to unit, provided with clean scrubs and meal. Patient used unit phone to notify family.No s/s of current distress.

## 2022-04-21 NOTE — ED Provider Notes (Cosign Needed Addendum)
Facility Based Crisis Admission H&P  Date: 04/21/22 Patient Name: Howard Adams MRN: [id number] Chief Complaint:  Chief Complaint  Patient presents with   Alcohol Problem      Diagnoses:  Final diagnoses:  Substance use disorder  Alcohol use disorder  Substance induced mood disorder (HCC)  Alcohol use disorder, severe, dependence (HCC)    HPI: Howard Adams 36 year old male with a history of alcohol use disorder sever dependence, substance use disorder, and depression presented voluntarily unaccompanied to [facility] requesting treatment for alcohol detox, and substance abuse treatment.  Roe was admitted to continuous assessment unit while awaiting to be transferred to the facility base crisis unit for alcohol detox.   Howard Adams, 36 y.o., male patient seen face to face by this provider, consulted with Dr. Nelly Rout; and chart reviewed on 04/21/22.  On evaluation Howard Adams reports he has alcohol use disorder but has never before sought treatment.  States he is wanting to get alcohol detox and set up with substance use services once discharged.  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  At this time he denies suicidal/self-harm/homicidal ideation, psychosis, and paranoia.   During evaluation Howard Adams is lying in bed with no noted distress.  He is alert, oriented x 4, calm, cooperative and attentive.  His mood is dysphoric with congruent affect.  He has normal speech, and behavior.  Objectively there is no evidence of psychosis/mania or delusional thinking.  Patient is able to converse coherently, goal directed thoughts, no distractibility, or pre-occupation.  He denies suicidal/self-harm/homicidal ideation, psychosis, and paranoia.  Patient answered question appropriately.      PHQ 2-9:  Flowsheet Row ED from 04/20/2022 in [facility] ED from 03/19/2022 in [facility]  Thoughts that you  would be better off dead, or of hurting yourself in some way Not at all Not at all  PHQ-9 Total Score 0 0       Flowsheet Row ED from 04/20/2022 in [facility] ED from 03/19/2022 in [facility] ED from 03/11/2022 in [facility]  C-SSRS RISK CATEGORY No Risk No Risk No Risk        Total Time spent with patient: 30 minutes  Musculoskeletal  Strength & Muscle Tone: within normal limits Gait & Station: normal Patient leans: N/A  Psychiatric Specialty Exam  Presentation General Appearance:  Appropriate for Environment  Eye Contact: Good  Speech: Clear and Coherent; Normal Rate  Speech Volume: Normal  Handedness: Right   Mood and Affect  Mood: Dysphoric  Affect: Congruent   Thought Process  Thought Processes: Coherent; Goal Directed  Descriptions of Associations:Intact  Orientation:Full (Time, Place and Person)  Thought Content:Logical  Diagnosis of Schizophrenia or Schizoaffective disorder in past: No   Hallucinations:Hallucinations: None  Ideas of Reference:None  Suicidal Thoughts:Suicidal Thoughts: No  Homicidal Thoughts:Homicidal Thoughts: No   Sensorium  Memory: Immediate Good; Recent Good; Remote Good  Judgment: Intact  Insight: Present   Executive Functions  Concentration: Good  Attention Span: Good  Recall: Good  Fund of Knowledge: Good  Language: Good   Psychomotor Activity  Psychomotor Activity: Psychomotor Activity: Normal   Assets  Assets: Communication Skills; Desire for Improvement; Social Support; Housing   Sleep  Sleep: Sleep: Fair Number of Hours of Sleep: 6   Nutritional Assessment (For OBS and FBC admissions only) Has the patient had a weight loss or gain of 10  pounds or more in the last 3 months?: No Has the patient had a decrease in food intake/or appetite?: No Does the patient have dental problems?:  No Does the patient have eating habits or behaviors that may be indicators of an eating disorder including binging or inducing vomiting?: No Has the patient recently lost weight without trying?: 0 Has the patient been eating poorly because of a decreased appetite?: 0 Malnutrition Screening Tool Score: 0    Physical Exam Vitals and nursing note reviewed.  Constitutional:      General: He is not in acute distress.    Appearance: He is well-developed. He is not ill-appearing or diaphoretic.  HENT:     Head: Normocephalic and atraumatic.  Cardiovascular:     Rate and Rhythm: Normal rate and regular rhythm.     Heart sounds: No murmur heard. Pulmonary:     Effort: Pulmonary effort is normal. No respiratory distress.     Breath sounds: Normal breath sounds.  Abdominal:     Palpations: Abdomen is soft.     Tenderness: There is no abdominal tenderness.  Musculoskeletal:        General: Normal range of motion.     Cervical back: Normal range of motion and neck supple.  Skin:    General: Skin is warm and dry.  Neurological:     Mental Status: He is alert and oriented to person, place, and time.  Psychiatric:        Attention and Perception: Attention and perception normal. He does not perceive auditory or visual hallucinations.        Mood and Affect: Mood normal.        Speech: Speech normal.        Behavior: Behavior normal. Behavior is cooperative.        Thought Content: Thought content normal. Thought content is not paranoid or delusional. Thought content does not include homicidal or suicidal ideation.        Cognition and Memory: Cognition normal.    Review of Systems  Constitutional: Negative.   HENT: Negative.    Eyes: Negative.   Respiratory: Negative.    Cardiovascular: Negative.   Gastrointestinal: Negative.   Genitourinary: Negative.   Musculoskeletal: Negative.   Skin: Negative.   Neurological: Negative.   Endo/Heme/Allergies: Negative.    Psychiatric/Behavioral:  Positive for substance abuse (THC, alcohol). The patient is nervous/anxious (Stable).     Blood pressure 122/82, pulse 88, temperature 98 F (36.7 C), temperature source Oral, resp. rate 18, SpO2 98 %. There is no height or weight on file to calculate BMI.  Past Psychiatric History: Marijuana use, alcohol use disorder, depression, substance abuse   Is the patient at risk to self? No  Has the patient been a risk to self in the past 6 months? No .    Has the patient been a risk to self within the distant past? No   Is the patient a risk to others? No   Has the patient been a risk to others in the past 6 months? No   Has the patient been a risk to others within the distant past? No   Past Medical History:  Past Medical History:  Diagnosis Date   Asthma    History reviewed. No pertinent surgical history.  Family History:  Family History  Problem Relation Age of Onset   Hypertension Father     Social History:  Social History   Socioeconomic History   Marital status: Significant Other  Spouse name: Not on file   Number of children: Not on file   Years of education: Not on file   Highest education level: Not on file  Occupational History   Not on file  Tobacco Use   Smoking status: Every Day    Packs/day: 0.50    Years: 10.00    Total pack years: 5.00    Types: Cigarettes   Smokeless tobacco: Never  Vaping Use   Vaping Use: Unknown  Substance and Sexual Activity   Alcohol use: Yes    Alcohol/week: 4.0 standard drinks of alcohol    Types: 4 Cans of beer per week    Comment: 4-5 cans a week   Drug use: Yes    Types: Marijuana    Comment: 2 weeks ago   Sexual activity: Yes    Birth control/protection: Condom  Other Topics Concern   Not on file  Social History Narrative   ** Merged History Encounter **       Social Determinants of Health   Financial Resource Strain: Unknown (03/28/2019)   Overall Financial Resource Strain (CARDIA)     Difficulty of Paying Living Expenses: Patient refused  Food Insecurity: Unknown (03/28/2019)   Hunger Vital Sign    Worried About Running Out of Food in the Last Year: Patient refused    Ran Out of Food in the Last Year: Patient refused  Transportation Needs: Unknown (03/28/2019)   PRAPARE - Transportation    Lack of Transportation (Medical): Patient refused    Lack of Transportation (Non-Medical): Patient refused  Physical Activity: Unknown (03/28/2019)   Exercise Vital Sign    Days of Exercise per Week: Patient refused    Minutes of Exercise per Session: Patient refused  Stress: Stress Concern Present (03/28/2019)   [employer] of Occupational Health - Occupational Stress Questionnaire    Feeling of Stress : Very much  Social Connections: Unknown (03/28/2019)   Social Connection and Isolation Panel [NHANES]    Frequency of Communication with Friends and Family: Patient refused    Frequency of Social Gatherings with Friends and Family: Patient refused    Attends Religious Services: Patient refused    Active Member of Clubs or Organizations: Patient refused    Attends Banker Meetings: Patient refused    Marital Status: Patient refused  Intimate Partner Violence: Unknown (03/28/2019)   Humiliation, Afraid, Rape, and Kick questionnaire    Fear of Current or Ex-Partner: Patient refused    Emotionally Abused: Patient refused    Physically Abused: Patient refused    Sexually Abused: Patient refused    SDOH:  SDOH Screenings   Food Insecurity: Unknown (03/28/2019)  Transportation Needs: Unknown (03/28/2019)  Alcohol Screen: Medium Risk (07/12/2019)  Depression (PHQ2-9): Low Risk  (04/20/2022)  Recent Concern: Depression (PHQ2-9) - High Risk (03/20/2022)  Financial Resource Strain: Unknown (03/28/2019)  Physical Activity: Unknown (03/28/2019)  Social Connections: Unknown (03/28/2019)  Stress: Stress Concern Present (03/28/2019)  Tobacco Use: High Risk (04/21/2022)     Last Labs:  Admission on 04/20/2022  Component Date Value Ref Range Status   SARS Coronavirus 2 by RT PCR 04/20/2022 NEGATIVE  NEGATIVE Final   Comment: (NOTE) SARS-CoV-2 target nucleic acids are NOT DETECTED.  The SARS-CoV-2 RNA is generally detectable in upper respiratory specimens during the acute phase of infection. The lowest concentration of SARS-CoV-2 viral copies this assay can detect is 138 copies/mL. A negative result does not preclude SARS-Cov-2 infection and should not be used as the sole basis for  treatment or other patient management decisions. A negative result may occur with  improper specimen collection/handling, submission of specimen other than nasopharyngeal swab, presence of viral mutation(s) within the areas targeted by this assay, and inadequate number of viral copies(<138 copies/mL). A negative result must be combined with clinical observations, patient history, and epidemiological information. The expected result is Negative.  Fact Sheet for Patients:  BloggerCourse.com  Fact Sheet for Healthcare Providers:  SeriousBroker.it  This test is no                          t yet approved or cleared by the [place] FDA and  has been authorized for detection and/or diagnosis of SARS-CoV-2 by FDA under an Emergency Use Authorization (EUA). This EUA will remain  in effect (meaning this test can be used) for the duration of the COVID-19 declaration under Section 564(b)(1) of the Act, 21 U.S.C.section 360bbb-3(b)(1), unless the authorization is terminated  or revoked sooner.       Influenza A by PCR 04/20/2022 NEGATIVE  NEGATIVE Final   Influenza B by PCR 04/20/2022 NEGATIVE  NEGATIVE Final   Comment: (NOTE) The Xpert Xpress SARS-CoV-2/FLU/RSV plus assay is intended as an aid in the diagnosis of influenza from Nasopharyngeal swab specimens and should not be used as a sole basis for treatment. Nasal  washings and aspirates are unacceptable for Xpert Xpress SARS-CoV-2/FLU/RSV testing.  Fact Sheet for Patients: BloggerCourse.com  Fact Sheet for Healthcare Providers: SeriousBroker.it  This test is not yet approved or cleared by the [place] FDA and has been authorized for detection and/or diagnosis of SARS-CoV-2 by FDA under an Emergency Use Authorization (EUA). This EUA will remain in effect (meaning this test can be used) for the duration of the COVID-19 declaration under Section 564(b)(1) of the Act, 21 U.S.C. section 360bbb-3(b)(1), unless the authorization is terminated or revoked.  Performed at [facility], [address] [place], [place] [address]    WBC 04/20/2022 4.6  4.0 - 10.5 K/uL Final   RBC 04/20/2022 3.89 (L)  4.22 - 5.81 MIL/uL Final   Hemoglobin 04/20/2022 13.0  13.0 - 17.0 g/dL Final   HCT 56/21/3086 36.7 (L)  39.0 - 52.0 % Final   MCV 04/20/2022 94.3  80.0 - 100.0 fL Final   MCH 04/20/2022 33.4  26.0 - 34.0 pg Final   MCHC 04/20/2022 35.4  30.0 - 36.0 g/dL Final   RDW 57/84/6962 13.0  11.5 - 15.5 % Final   Platelets 04/20/2022 200  150 - 400 K/uL Final   nRBC 04/20/2022 0.0  0.0 - 0.2 % Final   Neutrophils Relative % 04/20/2022 57  % Final   Neutro Abs 04/20/2022 2.6  1.7 - 7.7 K/uL Final   Lymphocytes Relative 04/20/2022 35  % Final   Lymphs Abs 04/20/2022 1.6  0.7 - 4.0 K/uL Final   Monocytes Relative 04/20/2022 7  % Final   Monocytes Absolute 04/20/2022 0.3  0.1 - 1.0 K/uL Final   Eosinophils Relative 04/20/2022 1  % Final   Eosinophils Absolute 04/20/2022 0.0  0.0 - 0.5 K/uL Final   Basophils Relative 04/20/2022 0  % Final   Basophils Absolute 04/20/2022 0.0  0.0 - 0.1 K/uL Final   Immature Granulocytes 04/20/2022 0  % Final   Abs Immature Granulocytes 04/20/2022 0.02  0.00 - 0.07 K/uL Final   Performed at [facility], [address] [place], [place] [address]   Sodium 04/20/2022  140  135 - 145 mmol/L Final   Potassium 04/20/2022 4.1  3.5 - 5.1 mmol/L Final   Chloride 04/20/2022 105  98 - 111 mmol/L Final   CO2 04/20/2022 21 (L)  22 - 32 mmol/L Final   Glucose, Bld 04/20/2022 83  70 - 99 mg/dL Final   Glucose reference range applies only to samples taken after fasting for at least 8 hours.   BUN 04/20/2022 <5 (L)  6 - 20 mg/dL Final   Creatinine, Ser 04/20/2022 0.79  0.61 - 1.24 mg/dL Final   Calcium 84/69/6295 8.8 (L)  8.9 - 10.3 mg/dL Final   Total Protein 28/41/3244 5.3 (L)  6.5 - 8.1 g/dL Final   Albumin 05/28/7251 2.9 (L)  3.5 - 5.0 g/dL Final   AST 66/44/0347 84 (H)  15 - 41 U/L Final   ALT 04/20/2022 50 (H)  0 - 44 U/L Final   Alkaline Phosphatase 04/20/2022 57  38 - 126 U/L Final   Total Bilirubin 04/20/2022 0.7  0.3 - 1.2 mg/dL Final   GFR, Estimated 04/20/2022 >60  >60 mL/min Final   Comment: (NOTE) Calculated using the CKD-EPI Creatinine Equation (2021)    Anion gap 04/20/2022 14  5 - 15 Final   Performed at [facility], [address], [place], [place] [address]   Alcohol, Ethyl (B) 04/20/2022 302 (HH)  <10 mg/dL Final   Comment: CRITICAL RESULT CALLED TO, READ BACK BY AND VERIFIED WITH MARGARET CINDERELLA AT [facility] 04/21/22 0036 M KOROLESKI (NOTE) Lowest detectable limit for serum alcohol is 10 mg/dL.  For medical purposes only. Performed at [facility], [address] [place], [place] [address]    POC Amphetamine UR 04/21/2022 None Detected  NONE DETECTED (Cut Off Level 1000 ng/mL) Final   POC Secobarbital (BAR) 04/21/2022 None Detected  NONE DETECTED (Cut Off Level 300 ng/mL) Final   POC Buprenorphine (BUP) 04/21/2022 None Detected  NONE DETECTED (Cut Off Level 10 ng/mL) Final   POC Oxazepam (BZO) 04/21/2022 None Detected  NONE DETECTED (Cut Off Level 300 ng/mL) Final   POC Cocaine UR 04/21/2022 None Detected  NONE DETECTED (Cut Off Level 300 ng/mL) Final   POC Methamphetamine UR 04/21/2022 None Detected  NONE DETECTED (Cut Off  Level 1000 ng/mL) Final   POC Morphine 04/21/2022 None Detected  NONE DETECTED (Cut Off Level 300 ng/mL) Final   POC Methadone UR 04/21/2022 None Detected  NONE DETECTED (Cut Off Level 300 ng/mL) Final   POC Oxycodone UR 04/21/2022 None Detected  NONE DETECTED (Cut Off Level 100 ng/mL) Final   POC Marijuana UR 04/21/2022 Positive (A)  NONE DETECTED (Cut Off Level 50 ng/mL) Final   Cholesterol 04/20/2022 206 (H)  0 - 200 mg/dL Final   Triglycerides 64/33/2951 64  <150 mg/dL Final   HDL 88/41/6606 123  >40 mg/dL Final   Total CHOL/HDL Ratio 04/20/2022 1.7  RATIO Final   VLDL 04/20/2022 13  0 - 40 mg/dL Final   LDL Cholesterol 04/20/2022 70  0 - 99 mg/dL Final   Comment:        Total Cholesterol/HDL:CHD Risk Coronary Heart Disease Risk Table                     Men   Women  1/2 Average Risk   3.4   3.3  Average Risk       5.0   4.4  2 X Average Risk   9.6   7.1  3  X Average Risk  23.4   11.0        Use the calculated Patient Ratio above and the CHD Risk Table to determine the patient's CHD Risk.        ATP III CLASSIFICATION (LDL):  <100     mg/dL   Optimal  419-622  mg/dL   Near or Above                    Optimal  130-159  mg/dL   Borderline  297-989  mg/dL   High  >211     mg/dL   Very High Performed at [facility], [address] [place], [place] [address]    TSH 04/20/2022 0.771  0.350 - 4.500 uIU/mL Final   Comment: Performed by a 3rd Generation assay with a functional sensitivity of <=0.01 uIU/mL. Performed at [facility], [address], [place], [place] [address]    SARSCOV2ONAVIRUS 2 AG 04/20/2022 NEGATIVE  NEGATIVE Final   Comment: (NOTE) SARS-CoV-2 antigen NOT DETECTED.   Negative results are presumptive.  Negative results do not preclude SARS-CoV-2 infection and should not be used as the sole basis for treatment or other patient management decisions, including infection  control decisions, particularly in the presence of clinical signs and   symptoms consistent with COVID-19, or in those who have been in contact with the virus.  Negative results must be combined with clinical observations, patient history, and epidemiological information. The expected result is Negative.  Fact Sheet for Patients: https://www.jennings-kim.com/  Fact Sheet for Healthcare Providers: https://alexander-rogers.biz/  This test is not yet approved or cleared by the [place] FDA and  has been authorized for detection and/or diagnosis of SARS-CoV-2 by FDA under an Emergency Use Authorization (EUA).  This EUA will remain in effect (meaning this test can be used) for the duration of  the COV                          ID-19 declaration under Section 564(b)(1) of the Act, 21 U.S.C. section 360bbb-3(b)(1), unless the authorization is terminated or revoked sooner.    Admission on 03/19/2022, Discharged on 03/20/2022  Component Date Value Ref Range Status   SARS Coronavirus 2 by RT PCR 03/19/2022 NEGATIVE  NEGATIVE Final   Comment: (NOTE) SARS-CoV-2 target nucleic acids are NOT DETECTED.  The SARS-CoV-2 RNA is generally detectable in upper respiratory specimens during the acute phase of infection. The lowest concentration of SARS-CoV-2 viral copies this assay can detect is 138 copies/mL. A negative result does not preclude SARS-Cov-2 infection and should not be used as the sole basis for treatment or other patient management decisions. A negative result may occur with  improper specimen collection/handling, submission of specimen other than nasopharyngeal swab, presence of viral mutation(s) within the areas targeted by this assay, and inadequate number of viral copies(<138 copies/mL). A negative result must be combined with clinical observations, patient history, and epidemiological information. The expected result is Negative.  Fact Sheet for Patients:  BloggerCourse.com  Fact Sheet for  Healthcare Providers:  SeriousBroker.it  This test is no                          t yet approved or cleared by the [place] FDA and  has been authorized for detection and/or diagnosis of SARS-CoV-2 by FDA under an Emergency Use Authorization (EUA). This EUA will remain  in  effect (meaning this test can be used) for the duration of the COVID-19 declaration under Section 564(b)(1) of the Act, 21 U.S.C.section 360bbb-3(b)(1), unless the authorization is terminated  or revoked sooner.       Influenza A by PCR 03/19/2022 NEGATIVE  NEGATIVE Final   Influenza B by PCR 03/19/2022 NEGATIVE  NEGATIVE Final   Comment: (NOTE) The Xpert Xpress SARS-CoV-2/FLU/RSV plus assay is intended as an aid in the diagnosis of influenza from Nasopharyngeal swab specimens and should not be used as a sole basis for treatment. Nasal washings and aspirates are unacceptable for Xpert Xpress SARS-CoV-2/FLU/RSV testing.  Fact Sheet for Patients: BloggerCourse.com  Fact Sheet for Healthcare Providers: SeriousBroker.it  This test is not yet approved or cleared by the [place] FDA and has been authorized for detection and/or diagnosis of SARS-CoV-2 by FDA under an Emergency Use Authorization (EUA). This EUA will remain in effect (meaning this test can be used) for the duration of the COVID-19 declaration under Section 564(b)(1) of the Act, 21 U.S.C. section 360bbb-3(b)(1), unless the authorization is terminated or revoked.  Performed at [facility], [address] [place], [place] [address]    Alcohol, Ethyl (B) 03/19/2022 318 (HH)  <10 mg/dL Final   Comment: CRITICAL RESULT CALLED TO, READ BACK BY AND VERIFIED WITH BOBBY BROOKS RN 03/20/22 0058 M KOROLESKI (NOTE) Lowest detectable limit for serum alcohol is 10 mg/dL.  For medical purposes only. Performed at [facility], [address], [place],  [place] [address]    WBC 03/19/2022 3.9 (L)  4.0 - 10.5 K/uL Final   RBC 03/19/2022 4.48  4.22 - 5.81 MIL/uL Final   Hemoglobin 03/19/2022 14.9  13.0 - 17.0 g/dL Final   HCT 14/78/2956 42.9  39.0 - 52.0 % Final   MCV 03/19/2022 95.8  80.0 - 100.0 fL Final   MCH 03/19/2022 33.3  26.0 - 34.0 pg Final   MCHC 03/19/2022 34.7  30.0 - 36.0 g/dL Final   RDW 21/30/8657 13.1  11.5 - 15.5 % Final   Platelets 03/19/2022 183  150 - 400 K/uL Final   nRBC 03/19/2022 0.0  0.0 - 0.2 % Final   Neutrophils Relative % 03/19/2022 42  % Final   Neutro Abs 03/19/2022 1.6 (L)  1.7 - 7.7 K/uL Final   Lymphocytes Relative 03/19/2022 47  % Final   Lymphs Abs 03/19/2022 1.8  0.7 - 4.0 K/uL Final   Monocytes Relative 03/19/2022 9  % Final   Monocytes Absolute 03/19/2022 0.4  0.1 - 1.0 K/uL Final   Eosinophils Relative 03/19/2022 1  % Final   Eosinophils Absolute 03/19/2022 0.1  0.0 - 0.5 K/uL Final   Basophils Relative 03/19/2022 1  % Final   Basophils Absolute 03/19/2022 0.0  0.0 - 0.1 K/uL Final   Immature Granulocytes 03/19/2022 0  % Final   Abs Immature Granulocytes 03/19/2022 0.01  0.00 - 0.07 K/uL Final   Performed at [facility], [address], [place], [place] [address]   Sodium 03/19/2022 145  135 - 145 mmol/L Final   Potassium 03/19/2022 3.9  3.5 - 5.1 mmol/L Final   Chloride 03/19/2022 113 (H)  98 - 111 mmol/L Final   CO2 03/19/2022 23  22 - 32 mmol/L Final   Glucose, Bld 03/19/2022 77  70 - 99 mg/dL Final   Glucose reference range applies only to samples taken after fasting for at least 8 hours.   BUN 03/19/2022 7  6 - 20  mg/dL Final   Creatinine, Ser 03/19/2022 0.85  0.61 - 1.24 mg/dL Final   Calcium 16/02/9603 8.2 (L)  8.9 - 10.3 mg/dL Final   Total Protein 54/01/8118 4.9 (L)  6.5 - 8.1 g/dL Final   Albumin 14/78/2956 2.6 (L)  3.5 - 5.0 g/dL Final   AST 21/30/8657 48 (H)  15 - 41 U/L Final   ALT 03/19/2022 56 (H)  0 - 44 U/L Final   Alkaline Phosphatase 03/19/2022 53  38 - 126 U/L Final    Total Bilirubin 03/19/2022 0.2 (L)  0.3 - 1.2 mg/dL Final   GFR, Estimated 03/19/2022 >60  >60 mL/min Final   Comment: (NOTE) Calculated using the CKD-EPI Creatinine Equation (2021)    Anion gap 03/19/2022 9  5 - 15 Final   Performed at [facility], [address] [place], [place] [address]   TSH 03/19/2022 0.734  0.350 - 4.500 uIU/mL Final   Comment: Performed by a 3rd Generation assay with a functional sensitivity of <=0.01 uIU/mL. Performed at [facility], [address] [place], [place] [address]    POC Amphetamine UR 03/19/2022 None Detected  NONE DETECTED (Cut Off Level 1000 ng/mL) Final   POC Secobarbital (BAR) 03/19/2022 None Detected  NONE DETECTED (Cut Off Level 300 ng/mL) Final   POC Buprenorphine (BUP) 03/19/2022 None Detected  NONE DETECTED (Cut Off Level 10 ng/mL) Final   POC Oxazepam (BZO) 03/19/2022 None Detected  NONE DETECTED (Cut Off Level 300 ng/mL) Final   POC Cocaine UR 03/19/2022 None Detected  NONE DETECTED (Cut Off Level 300 ng/mL) Final   POC Methamphetamine UR 03/19/2022 None Detected  NONE DETECTED (Cut Off Level 1000 ng/mL) Final   POC Morphine 03/19/2022 None Detected  NONE DETECTED (Cut Off Level 300 ng/mL) Final   POC Methadone UR 03/19/2022 None Detected  NONE DETECTED (Cut Off Level 300 ng/mL) Final   POC Oxycodone UR 03/19/2022 None Detected  NONE DETECTED (Cut Off Level 100 ng/mL) Final   POC Marijuana UR 03/19/2022 Positive (A)  NONE DETECTED (Cut Off Level 50 ng/mL) Final   SARSCOV2ONAVIRUS 2 AG 03/20/2022 NEGATIVE  NEGATIVE Final   Comment: (NOTE) SARS-CoV-2 antigen NOT DETECTED.   Negative results are presumptive.  Negative results do not preclude SARS-CoV-2 infection and should not be used as the sole basis for treatment or other patient management decisions, including infection  control decisions, particularly in the presence of clinical signs and  symptoms consistent with COVID-19, or in those who have been in contact with the  virus.  Negative results must be combined with clinical observations, patient history, and epidemiological information. The expected result is Negative.  Fact Sheet for Patients: https://www.jennings-kim.com/  Fact Sheet for Healthcare Providers: https://alexander-rogers.biz/  This test is not yet approved or cleared by the [place] FDA and  has been authorized for detection and/or diagnosis of SARS-CoV-2 by FDA under an Emergency Use Authorization (EUA).  This EUA will remain in effect (meaning this test can be used) for the duration of  the COV                          ID-19 declaration under Section 564(b)(1) of the Act, 21 U.S.C. section 360bbb-3(b)(1), unless the authorization is terminated or revoked sooner.    Admission on 03/11/2022, Discharged on 03/12/2022  Component Date Value Ref Range Status   SARS Coronavirus 2 by RT PCR 03/11/2022 NEGATIVE  NEGATIVE Final   Comment: (NOTE)  SARS-CoV-2 target nucleic acids are NOT DETECTED.  The SARS-CoV-2 RNA is generally detectable in upper respiratory specimens during the acute phase of infection. The lowest concentration of SARS-CoV-2 viral copies this assay can detect is 138 copies/mL. A negative result does not preclude SARS-Cov-2 infection and should not be used as the sole basis for treatment or other patient management decisions. A negative result may occur with  improper specimen collection/handling, submission of specimen other than nasopharyngeal swab, presence of viral mutation(s) within the areas targeted by this assay, and inadequate number of viral copies(<138 copies/mL). A negative result must be combined with clinical observations, patient history, and epidemiological information. The expected result is Negative.  Fact Sheet for Patients:  BloggerCourse.com  Fact Sheet for Healthcare Providers:  SeriousBroker.it  This test is no                           t yet approved or cleared by the [place] FDA and  has been authorized for detection and/or diagnosis of SARS-CoV-2 by FDA under an Emergency Use Authorization (EUA). This EUA will remain  in effect (meaning this test can be used) for the duration of the COVID-19 declaration under Section 564(b)(1) of the Act, 21 U.S.C.section 360bbb-3(b)(1), unless the authorization is terminated  or revoked sooner.       Influenza A by PCR 03/11/2022 NEGATIVE  NEGATIVE Final   Influenza B by PCR 03/11/2022 NEGATIVE  NEGATIVE Final   Comment: (NOTE) The Xpert Xpress SARS-CoV-2/FLU/RSV plus assay is intended as an aid in the diagnosis of influenza from Nasopharyngeal swab specimens and should not be used as a sole basis for treatment. Nasal washings and aspirates are unacceptable for Xpert Xpress SARS-CoV-2/FLU/RSV testing.  Fact Sheet for Patients: BloggerCourse.com  Fact Sheet for Healthcare Providers: SeriousBroker.it  This test is not yet approved or cleared by the [place] FDA and has been authorized for detection and/or diagnosis of SARS-CoV-2 by FDA under an Emergency Use Authorization (EUA). This EUA will remain in effect (meaning this test can be used) for the duration of the COVID-19 declaration under Section 564(b)(1) of the Act, 21 U.S.C. section 360bbb-3(b)(1), unless the authorization is terminated or revoked.  Performed at [facility], [address] [place], [place] [address]    WBC 03/11/2022 5.4  4.0 - 10.5 K/uL Final   RBC 03/11/2022 4.53  4.22 - 5.81 MIL/uL Final   Hemoglobin 03/11/2022 15.5  13.0 - 17.0 g/dL Final   HCT 84/69/6295 42.9  39.0 - 52.0 % Final   MCV 03/11/2022 94.7  80.0 - 100.0 fL Final   MCH 03/11/2022 34.2 (H)  26.0 - 34.0 pg Final   MCHC 03/11/2022 36.1 (H)  30.0 - 36.0 g/dL Final   RDW 28/41/3244 12.9  11.5 - 15.5 % Final   Platelets 03/11/2022 277  150 - 400 K/uL  Final   nRBC 03/11/2022 0.0  0.0 - 0.2 % Final   Neutrophils Relative % 03/11/2022 49  % Final   Neutro Abs 03/11/2022 2.7  1.7 - 7.7 K/uL Final   Lymphocytes Relative 03/11/2022 41  % Final   Lymphs Abs 03/11/2022 2.2  0.7 - 4.0 K/uL Final   Monocytes Relative 03/11/2022 7  % Final   Monocytes Absolute 03/11/2022 0.4  0.1 - 1.0 K/uL Final   Eosinophils Relative 03/11/2022 2  % Final   Eosinophils Absolute 03/11/2022 0.1  0.0 - 0.5 K/uL Final   Basophils Relative 03/11/2022  1  % Final   Basophils Absolute 03/11/2022 0.0  0.0 - 0.1 K/uL Final   Immature Granulocytes 03/11/2022 0  % Final   Abs Immature Granulocytes 03/11/2022 0.02  0.00 - 0.07 K/uL Final   Performed at [facility], [address] [place], [place] [address]   Sodium 03/11/2022 144  135 - 145 mmol/L Final   Potassium 03/11/2022 3.7  3.5 - 5.1 mmol/L Final   Chloride 03/11/2022 113 (H)  98 - 111 mmol/L Final   CO2 03/11/2022 21 (L)  22 - 32 mmol/L Final   Glucose, Bld 03/11/2022 99  70 - 99 mg/dL Final   Glucose reference range applies only to samples taken after fasting for at least 8 hours.   BUN 03/11/2022 10  6 - 20 mg/dL Final   Creatinine, Ser 03/11/2022 0.91  0.61 - 1.24 mg/dL Final   Calcium 50/93/2671 8.6 (L)  8.9 - 10.3 mg/dL Final   Total Protein 24/58/0998 5.9 (L)  6.5 - 8.1 g/dL Final   Albumin 33/82/5053 3.2 (L)  3.5 - 5.0 g/dL Final   AST 97/67/3419 27  15 - 41 U/L Final   ALT 03/11/2022 44  0 - 44 U/L Final   Alkaline Phosphatase 03/11/2022 56  38 - 126 U/L Final   Total Bilirubin 03/11/2022 0.3  0.3 - 1.2 mg/dL Final   GFR, Estimated 03/11/2022 >60  >60 mL/min Final   Comment: (NOTE) Calculated using the CKD-EPI Creatinine Equation (2021)    Anion gap 03/11/2022 10  5 - 15 Final   Performed at [facility], [address] [place], [place] [address]   Hgb A1c MFr Bld 03/11/2022 4.9  4.8 - 5.6 % Final   Comment: (NOTE) Pre diabetes:          5.7%-6.4%  Diabetes:               >6.4%  Glycemic control for   <7.0% adults with diabetes    Mean Plasma Glucose 03/11/2022 93.93  mg/dL Final   Performed at [facility], [address], [place], [place] [address]   Magnesium 03/11/2022 2.3  1.7 - 2.4 mg/dL Final   Performed at [facility], [address], [place], [place] [address]   Alcohol, Ethyl (B) 03/11/2022 269 (H)  <10 mg/dL Final   Comment: (NOTE) Lowest detectable limit for serum alcohol is 10 mg/dL.  For medical purposes only. Performed at [facility], [address], [place], [place] [address]    Cholesterol 03/11/2022 157  0 - 200 mg/dL Final   Triglycerides 34/19/6222 548 (H)  <150 mg/dL Final   HDL 97/98/9211 60  >40 mg/dL Final   Total CHOL/HDL Ratio 03/11/2022 2.6  RATIO Final   VLDL 03/11/2022 UNABLE TO CALCULATE IF TRIGLYCERIDE OVER 400 mg/dL  0 - 40 mg/dL Final   LDL Cholesterol 03/11/2022 UNABLE TO CALCULATE IF TRIGLYCERIDE OVER 400 mg/dL  0 - 99 mg/dL Final   Comment:        Total Cholesterol/HDL:CHD Risk Coronary Heart Disease Risk Table                     Men   Women  1/2 Average Risk   3.4   3.3  Average Risk       5.0   4.4  2 X Average Risk   9.6   7.1  3 X Average Risk  23.4   11.0  Use the calculated Patient Ratio above and the CHD Risk Table to determine the patient's CHD Risk.        ATP III CLASSIFICATION (LDL):  <100     mg/dL   Optimal  [phone]  mg/dL   Near or Above                    Optimal  130-159  mg/dL   Borderline  [phone]  mg/dL   High  >638>190     mg/dL   Very High Performed at [facility], [address] [place], [place] [address]    TSH 03/11/2022 1.019  0.350 - 4.500 uIU/mL Final   Comment: Performed by a 3rd Generation assay with a functional sensitivity of <=0.01 uIU/mL. Performed at [facility], [address] [place], [place] [address]    RPR Ser Ql 03/11/2022 NON REACTIVE  NON REACTIVE Final   Performed at [facility], [address]  [place], [place] [address]   Chlamydia 03/11/2022 Negative   Final   Neisseria Gonorrhea 03/11/2022 Negative   Final   Comment 03/11/2022 Normal Reference Ranger Chlamydia - Negative   Final   Comment 03/11/2022 Normal Reference Range Neisseria Gonorrhea - Negative   Final   POC Amphetamine UR 03/11/2022 None Detected  NONE DETECTED (Cut Off Level 1000 ng/mL) Final   POC Secobarbital (BAR) 03/11/2022 None Detected  NONE DETECTED (Cut Off Level 300 ng/mL) Final   POC Buprenorphine (BUP) 03/11/2022 None Detected  NONE DETECTED (Cut Off Level 10 ng/mL) Final   POC Oxazepam (BZO) 03/11/2022 None Detected  NONE DETECTED (Cut Off Level 300 ng/mL) Final   POC Cocaine UR 03/11/2022 None Detected  NONE DETECTED (Cut Off Level 300 ng/mL) Final   POC Methamphetamine UR 03/11/2022 Positive (A)  NONE DETECTED (Cut Off Level 1000 ng/mL) Final   POC Morphine 03/11/2022 None Detected  NONE DETECTED (Cut Off Level 300 ng/mL) Final   POC Methadone UR 03/11/2022 None Detected  NONE DETECTED (Cut Off Level 300 ng/mL) Final   POC Oxycodone UR 03/11/2022 None Detected  NONE DETECTED (Cut Off Level 100 ng/mL) Final   POC Marijuana UR 03/11/2022 Positive (A)  NONE DETECTED (Cut Off Level 50 ng/mL) Final   SARS Coronavirus 2 Ag 03/11/2022 Negative  Negative Final   Direct LDL 03/11/2022 49  0 - 99 mg/dL Final   Performed at [facility], [address] [place], [place] [address]    Allergies: Patient has no known allergies.  PTA Medications: None reported  Long Term Goals: Improvement in symptoms so as ready for discharge  Short Term Goals: Patient will verbalize feelings in meetings with treatment team members., Patient will attend at least of 50% of the groups daily., Pt will complete the PHQ9 on admission, day 3 and discharge., Patient will participate in completing the [place] Suicide Severity Rating Scale, Patient will score a low risk of violence for 24 hours prior to discharge, and Patient will take  medications as prescribed daily.  Medical Decision Making  Howard BillMarquis Hornsby was [facility] crisis unit under the service of Nelly RoutKumar, Archana, MD for Substance induced mood disorder [facility](HCC), crisis management, and stabilization. Routine labs reviewed: Lab Orders         Resp Panel by RT-PCR (Flu A&B, Covid) Anterior Nasal Swab         CBC with Differential/Platelet  Comprehensive metabolic panel         Hemoglobin A1c         Ethanol         Lipid panel         TSH         POCT Urine Drug Screen - (I-Screen)         POC SARS Coronavirus 2 Ag    Medication Management: Medications started  multivitamin with minerals  1 tablet Oral Daily   thiamine  100 mg Intramuscular Once   thiamine  100 mg Oral Daily   PRN Meds:.acetaminophen, alum & mag hydroxide-simeth, hydrOXYzine, loperamide, LORazepam, magnesium hydroxide, ondansetron  Will maintain observation checks every 15 minutes for safety. Psychosocial education regarding relapse prevention and self-care; social and communication  Social work will consult with family for collateral information and discuss discharge and follow up plan.    Recommendations  Based on my evaluation the patient does not appear to have an emergency medical condition.  Esteven Overfelt, NP 04/21/22  9:51 AM

## 2022-04-21 NOTE — ED Notes (Signed)
Pt sitting in dining room watching TV. A&O x4, calm and cooperative. Denies current SI/HI/AVH. No signs of distress noted. Monitoring for safety.  

## 2022-04-21 NOTE — ED Notes (Signed)
Report given to [facility] LPN

## 2022-04-21 NOTE — ED Notes (Signed)
Patient A&Ox4. Patient denies SI/Hi and AVH.  Patient denies any physical complaints when asked. No acute distress noted. Support and encouragement provided. Routine safety checks conducted according to facility protocol. Encouraged patient to notify staff if thoughts of harm toward self or others arise. Patient verbalize understanding and agreement. Will continue to monitor for safety.    

## 2022-04-22 ENCOUNTER — Encounter (HOSPITAL_COMMUNITY): Payer: Self-pay | Admitting: Registered Nurse

## 2022-04-22 DIAGNOSIS — F1020 Alcohol dependence, uncomplicated: Secondary | ICD-10-CM | POA: Diagnosis not present

## 2022-04-22 DIAGNOSIS — F1994 Other psychoactive substance use, unspecified with psychoactive substance-induced mood disorder: Secondary | ICD-10-CM | POA: Diagnosis not present

## 2022-04-22 DIAGNOSIS — Z1152 Encounter for screening for COVID-19: Secondary | ICD-10-CM | POA: Diagnosis not present

## 2022-04-22 LAB — HEMOGLOBIN A1C
Hgb A1c MFr Bld: 4.8 % (ref 4.8–5.6)
Mean Plasma Glucose: 91 mg/dL

## 2022-04-22 MED ORDER — VITAMIN B-1 100 MG PO TABS: 100.0000 mg | ORAL_TABLET | Freq: Every day | ORAL | 0 refills | Status: AC

## 2022-04-22 NOTE — ED Notes (Signed)
Pt asleep in bed. Respirations even and unlabored. Monitoring for safety. 

## 2022-04-22 NOTE — ED Notes (Signed)
Patient A&O x 4, ambulatory. Patient discharged in no acute distress. Patient denied SI/HI, A/VH upon discharge. Patient verbalized understanding of all discharge instructions explained by staff, to include follow up appointments, RX's and safety plan. Patient reported mood 10/10.  Pt belongings returned to patient from locker # 1 intact. Patient escorted to lobby via staff for transport to destination. Safety maintained.  

## 2022-04-22 NOTE — ED Provider Notes (Signed)
FBC/OBS ASAP Discharge Summary  Date and Time: 04/22/2022, 7:22 PM  Name: Howard Adams  Age: 36 y.o.  DOB: 1985/12/25  MRN:  [id number]   Discharge Diagnoses:  Final diagnoses:  Substance induced mood disorder (HCC)  Alcohol use disorder, severe, dependence (HCC)    HPI:  Per H&P: " Howard Adams 36 year old male with a history of alcohol use disorder sever dependence, substance use disorder, and depression presented voluntarily unaccompanied to [facility] requesting treatment for alcohol detox, and substance abuse treatment.  [name] was admitted to continuous assessment unit while awaiting to be transferred to the facility base crisis unit for alcohol detox.     Howard Adams, 36 y.o., male patient seen face to face by this provider, consulted with Dr. Nelly Rout; and chart reviewed on 04/21/22.  On evaluation Howard Adams reports he has alcohol use disorder but has never before sought treatment.  States he is wanting to get alcohol detox and set up with substance use services once discharged.  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  At this time he denies suicidal/self-harm/homicidal ideation, psychosis, and paranoia.   During evaluation Howard Adams is lying in bed with no noted distress.  He is alert, oriented x 4, calm, cooperative and attentive.  His mood is dysphoric with congruent affect.  He has normal speech, and behavior.  Objectively there is no evidence of psychosis/mania or delusional thinking.  Patient is able to converse coherently, goal directed thoughts, no distractibility, or pre-occupation.  He denies suicidal/self-harm/homicidal ideation, psychosis, and paranoia.  Patient answered question appropriately.   "  Subjective:  Appropriate for Environment, Casual, Fairly Groomed Logical, WDL  OF:[id number] Thoughts: No (Contracted to safety) ID:[id number] Thoughts: No RWE:[id number]: None (Denied AVH) Ideas of DTO:IZTI   Mood:  Euthymic Sleep:Good Appetite: Good  Review of Systems  Respiratory:  Negative for shortness of breath.   Cardiovascular:  Negative for chest pain.  Gastrointestinal:  Negative for nausea and vomiting.  Neurological:  Negative for dizziness and headaches.    Stay Summary:  Howard Adams is a 36 y.o. male with a history of alcohol use disorder sever dependence, substance use disorder, and depression presented voluntarily unaccompanied to [facility] requesting treatment for alcohol detox, and substance abuse treatment.  Howard Adams was admitted to continuous assessment unit while awaiting to be transferred to the facility base crisis unit for alcohol detox.  No home Rx  No behavioral concerns. He was pleasant and cooperative with treatment and therapy.   AUD, severe Patient had no etoh w/d sxs. No scheduled benzo taper required during stay. Patient was not in [facility] long enough to start naltrexone, however he was encouraged to talk to outpatient providers about naltrexone.  CIWA with PRN ativan per protocol MV and thiamine Dc'd home to family with SIOP appointment 11/29 Referral to [facility] for med management and therapy Referral to [facility] for PCP    Medication List    START taking these medications    thiamine 100 MG tablet; Commonly known as: Vitamin B-1; Take 1 tablet  (100 mg total) by mouth daily.; Start taking on: April 23, 2022    Clinical Course as of 04/22/22 1922  Tue Apr 22, 2022  0828 Alcohol, Ethyl (B)(!!): 302 [JN]  0828 POC Marijuana UR(!): Positive [JN]  0828 AST(!): 84 [JN]  0828 ALT(!): 50 [JN]    Clinical Course User Index [JN] Princess Bruins, DO    While future  psychiatric events cannot be accurately predicted, the patient does not currently require acute inpatient psychiatric care and does not currently meet [facility] involuntary commitment criteria.  Past Psychiatric History: Per H&P Past Medical History:   Past Medical History:  Diagnosis Date   Asthma     History reviewed. No pertinent surgical history. Family History:  Family History  Problem Relation Age of Onset   Hypertension Father    Family Psychiatric History: Per H&P Social History:  Social History   Substance and Sexual Activity  Alcohol Use Yes   Alcohol/week: 4.0 standard drinks of alcohol   Types: 4 Cans of beer per week   Comment: 4-5 cans a week     Social History   Substance and Sexual Activity  Drug Use Yes   Types: Marijuana   Comment: 2 weeks ago    Social History   Socioeconomic History   Marital status: Significant Other    Spouse name: Not on file   Number of children: Not on file   Years of education: Not on file   Highest education level: Not on file  Occupational History   Not on file  Tobacco Use   Smoking status: Every Day    Packs/day: 0.50    Years: 10.00    Total pack years: 5.00    Types: Cigarettes   Smokeless tobacco: Never  Vaping Use   Vaping Use: Unknown  Substance and Sexual Activity   Alcohol use: Yes    Alcohol/week: 4.0 standard drinks of alcohol    Types: 4 Cans of beer per week    Comment: 4-5 cans a week   Drug use: Yes    Types: Marijuana    Comment: 2 weeks ago   Sexual activity: Yes    Birth control/protection: Condom  Other Topics Concern   Not on file  Social History Narrative   ** Merged History Encounter **       Social Determinants of Health   Financial Resource Strain: Unknown (03/28/2019)   Overall Financial Resource Strain (CARDIA)    Difficulty of Paying Living Expenses: Patient refused  Food Insecurity: Unknown (03/28/2019)   Hunger Vital Sign    Worried About Running Out of Food in the Last Year: Patient refused    Ran Out of Food in the Last Year: Patient refused  Transportation Needs: Unknown (03/28/2019)   PRAPARE - Transportation    Lack of Transportation (Medical): Patient refused    Lack of Transportation (Non-Medical): Patient  refused  Physical Activity: Unknown (03/28/2019)   Exercise Vital Sign    Days of Exercise per Week: Patient refused    Minutes of Exercise per Session: Patient refused  Stress: Stress Concern Present (03/28/2019)   [employer] of Occupational Health - Occupational Stress Questionnaire    Feeling of Stress : Very much  Social Connections: Unknown (03/28/2019)   Social Connection and Isolation Panel [NHANES]    Frequency of Communication with Friends and Family: Patient refused    Frequency of Social Gatherings with Friends and Family: Patient refused    Attends Religious Services: Patient refused    Active Member of Clubs or Organizations: Patient refused    Attends Banker Meetings: Patient refused    Marital Status: Patient refused   SDOH:  SDOH Screenings   Food Insecurity: Unknown (03/28/2019)  Transportation Needs: Unknown (03/28/2019)  Alcohol Screen: Medium Risk (07/12/2019)  Depression (PHQ2-9): Medium Risk (04/22/2022)  Financial Resource Strain: Unknown (03/28/2019)  Physical Activity: Unknown (03/28/2019)  Social Connections: Unknown (03/28/2019)  Stress: Stress Concern Present (03/28/2019)  Tobacco Use: High Risk (04/22/2022)   Tobacco Cessation:  N/A, patient does not currently use tobacco products  Current Medications:  No current facility-administered medications for this encounter.   Current Outpatient Medications  Medication Sig Dispense Refill   [START ON 04/23/2022] thiamine (VITAMIN B-1) 100 MG tablet Take 1 tablet (100 mg total) by mouth daily. 30 tablet 0    PTA Medications: (Not in a hospital admission)     04/22/2022   12:48 PM 04/20/2022   11:33 PM 04/20/2022    5:00 PM  Depression screen PHQ 2/9  Decreased Interest 1 0 1  Down, Depressed, Hopeless 1 0 2  PHQ - 2 Score 2 0 3  Altered sleeping 0 0 2  Tired, decreased energy 1 0 1  Change in appetite 3 0 3  Feeling bad or failure about yourself  1 0 2  Trouble concentrating 0 0 0   Moving slowly or fidgety/restless 1 0 0  Suicidal thoughts 0 0 0  PHQ-9 Score 8 0 11  Difficult doing work/chores Somewhat difficult  Somewhat difficult    Flowsheet Row ED from [facility]/20/20[facility] ED from 03/19/2022 in [facility] ED from 03/11/2022 in [facility]  C-SSRS RISK CATEGORY No Risk No Risk No Risk       Musculoskeletal  Strength & Muscle Tone: within normal limits Gait & Station: normal Patient leans: N/A   Psychiatric Specialty Exam   Presentation  General Appearance:Appropriate for Environment, Casual, Fairly Groomed Eye Contact:Good Speech:Clear and Coherent, Normal Rate Volume:Normal Handedness:Right  Mood and Affect  Mood:Euthymic Affect:Appropriate, Congruent, Full Range  Thought Process  Thought Process:Coherent, Goal Directed, Linear Descriptions of Associations:Intact  Thought Content Suicidal Thoughts:Suicidal Thoughts: No (Contracted to safety) Homicidal Thoughts:Homicidal Thoughts: No Hallucinations:Hallucinations: None (Denied AVH) Ideas of Reference:None Thought Content:Logical, WDL  Sensorium  Memory:Immediate Good Judgment:Fair Insight:Fair  Executive Functions  Orientation:Full (Time, Place and Person) Language:Good Concentration:Good Attention:Good Recall:Good Fund of Knowledge:Good  Psychomotor Activity  Psychomotor Activity:Psychomotor Activity: Normal  Assets  Assets:Communication Skills, Desire for Improvement  Sleep  Quality:Good  Physical Exam  BP (!) 140/92 (BP Location: Right Arm)   Pulse 81   Temp 98.7 F (37.1 C) (Tympanic)   Resp 18   SpO2 100%   Physical Exam Vitals and nursing note reviewed.  Constitutional:      General: He is not in acute distress.    Appearance: He is not ill-appearing, toxic-appearing or diaphoretic.  HENT:     Head: Normocephalic.  Pulmonary:     Effort: Pulmonary effort is normal.  No respiratory distress.  Neurological:     Mental Status: He is alert.     Demographic Factors:  Low socioeconomic status  Loss Factors: Financial problems/change in socioeconomic status  Historical Factors: Impulsivity  Risk Reduction Factors:   Responsible for children under 14 years of age, Sense of responsibility to family, Employed, Living with another person, especially a relative, and Positive social support  Continued Clinical Symptoms:  Alcohol/Substance Abuse/Dependencies  Cognitive Features That Contribute To Risk:  Thought constriction (tunnel vision)    Suicide Risk:  Mild:  Suicidal ideation of limited frequency, intensity, duration, and specificity.  There are no identifiable plans, no associated intent, mild dysphoria and related symptoms, good self-control (both objective and subjective assessment), few other risk factors, and identifiable protective factors, including available and accessible social support.  Plan Of  Care/Follow-up recommendations:  Activity and diet at tolerated.  Please: Take all medications as prescribed by your mental healthcare provider. Report any adverse effects and or reactions from the medicines to your outpatient provider promptly. Do not engage in alcohol and or illegal drug use while on prescription medicines.  Disposition: Home with family and SIOP 11/29   Total Time spent with patient: 30 minutes  Signed: Princess BruinsJulie Margaretha Mahan, DO Psychiatry Resident, PGY-2 [facility] 04/22/2022, 7:22 PM

## 2022-04-22 NOTE — ED Notes (Signed)
"  Pt denies SI, plan, and intention.  Suicide safety plan completed, reviewed with this RN, given to the patient, and a copy in the chart." 

## 2022-04-22 NOTE — ED Notes (Signed)
Patient sitting in dayroom watching TV. Respirations equal and unlabored, skin warm and dry, NAD. No change in assessment or acuity. Q 15 minute safety checks remain in place.   

## 2022-04-22 NOTE — ED Notes (Signed)
Patient A&Ox4. Patient is calm, pleasant and cooperative. Patient denies SI/HI and AVH. Patient is requesting to discharge and spoke with MD and Child psychotherapist. Patient denies any physical complaints when asked. No acute distress noted. Support and encouragement provided. Routine safety checks conducted according to facility protocol. Encouraged patient to notify staff if thoughts of harm toward self or others arise. Patient verbalize understanding and agreement. Will continue to monitor for safety.

## 2022-04-22 NOTE — Discharge Instructions (Addendum)
[facility] [address], [place], [address] [phone] phone   New Patient Assessment/Therapy Walk-Ins:  Monday and Wednesday: 8 am until slots are full. Every 1st and 2nd Fridays of the month: 1 pm - 5 pm.  NO ASSESSMENT/THERAPY WALK-INS ON TUESDAYS OR THURSDAYS  New Patient Assessment/Medication Management Walk-Ins:  Monday - Friday:  8 am - 11 am.  For all walk-ins, we ask that you arrive by 7:30 am because patients will be seen in the order of arrival.  Availability is limited; therefore, you may not be seen on the same day that you walk-in.  Our goal is to serve and meet the needs of our community to the best of our ability.  SUBSTANCE USE TREATMENT for Medicaid and State Funded/IPRS  [facility] [address], [place], [address] [phone] phone NOTE: [facility] is no longer offering IOP services.  Serves those who are low-income or have no insurance.  [facility] [address] [place], [place], [address] [phone] phone [phone] fax NOTE: Does have Substance Abuse-Intensive Outpatient Program [facility]) as well as transitional housing if eligible.  [facility] [address] [place], [place], [address] [phone] phone [phone] fax  [facility] [phone] [place], [place], [address] [phone] phone [phone] fax  HALFWAY HOUSES:  [employer] [phone]  [employer].oxfordvacancies.com  12 STEP PROGRAMS:  Alcoholics Anonymous of [place] Software[employer]alet.be  Narcotics Anonymous of [place] [employer]ct.dk  Al-Anon of [place], [place] www.[place]alanon.org/find-meetings.html  Nar-Anon https://nar-anon.org/find-a-meetin  Shelters  [facility] [address] [place], [place] [address] [phone] Population served: Adult men & women (35 years old and  older, able to perform activities for daily living) Documents required: Valid ID & Social Security Card  [facility] [address], [place], [place] [address] [phone] Population served: Males 18+ Documents required: Valid ID & Social Security Ship broker of [employer] [address], [place], [place] [address] [phone] Population Served: Families with children, adult women, and adult men.  [facility] [address] [place], [place] [address] [phone] Population served: Men 18+, preference for disabled and/or veterans Eligibility: By referral only  Dear Ronnald Nian,  Most effective treatment for your mental health disease involves BOTH a psychiatrist AND a therapist Psychiatrist to manage medications Therapist to help identify personal goals, barriers from those goals, and plan to achieve those goals by understanding emotions Please walk to the 2nd floor of [facility] to get to the Behavior Health Outpatient Clinic, talk to the front desk staff about where to find the person who can help you with medicaid. Please make regular appointments with an outpatient psychiatrist and other doctors once you leave the hospital (if any, otherwise, please see below for resources to make an appointment).  For therapy outside the hospital, please ask for these specific types of therapy: CBT ________________________________________________________  SAFETY CRISIS  Dial 988 for National Suicide & Crisis Lifeline    Text [phone] for Crisis Text Line:     [facility] U[facility]ENT [facility]:  [address] FIRST FLOOR.  [place], [place] [address].  [phone]  Mobile Crisis Response Teams Listed by counties in vicinity of [facility] Health providers [facility] [phone] [facility] [employer] [phone] [facility] [employer] [phone] [facility] [employer] [phone] Ignacia Palma  [place]                * [facility] [phone]                * [employer] [phone] [facility] [phone] [facility] * [facility]  [phone] * [employer] [phone] ________________________________________________________  To see which pharmacy near you is the CHEAPEST for certain medications, please use GoodRx. It is free website and has a free phone app.    Also consider looking at [facility] $4.00 or [facility]'s $7.00 prescription list. Both are free to view if googled "[facility]mart $4 prescription" and "public's $7 prescription". These are set prices, no insurance required. [facility]'s low cost medications: $4-$15 for 30days prescriptions or $10-$38 for 90days prescriptions  ________________________________________________________  Difficulties with sleep?   Can also use this free app for insomnia called CBT-I. Let your doctors and therapists know so they can help with extra tips and tricks or for guidance and accountability. NO ADDS on the app.     ________________________________________________________  Non-Emergent / Urgent  [facility] [address], SECOND FLOOR [place], [place] [address] [phone] OUTPATIENT Walk-in information: Please note, all walk-ins are first come & first serve, with limited number of availability.  Please note that to be eligible for services you must bring: ID or a piece of mail with your name [facility] address  Therapist for therapy:  Monday & Wednesdays: Please ARRIVE at 7:15 AM for registration Will START at 8:00 AM Every 1st & 2nd Friday of the month: Please ARRIVE at 10:15 AM for registration Will START at 1 PM - 5 PM  Psychiatrist for medication management: Monday - Friday:  Please  ARRIVE at 7:15 AM for registration Will START at 8:00 AM  Regretfully, due to limited availability, please be aware that you may not been seen on the same day as walk-in. Please consider making an appoint or try again. Thank you for your patience and understanding.

## 2022-04-22 NOTE — Discharge Planning (Signed)
LCSW spoke with patient and MD on this morning regarding patient's plan. Patient informed LCSW and MD that his plan is to discharge home on today, and reports an interest in the CDIOP here at the [facility]. Information regarding the program was provided to the patient, and patient reports he would rather do that than residential at this time. Patient spoke about his need to get back to work to pay his bills and support his family. Patient aware that LCSW will make a referral to CDIOP and will provide appt information once received. Additional resources will also be provided within the patient's AVS. No other needs were reported at this time.   Fernande Boyden, LCSW Clinical Social Worker [place] [facility] Ph: [phone]

## 2022-04-22 NOTE — ED Notes (Signed)
Patient sitting dayroom eating lunch. Respirations equal and unlabored, skin warm and dry, NAD. No change in assessment or acuity. Q 15 minute safety checks remain in place.

## 2022-04-22 NOTE — ED Notes (Signed)
Patient notified of 3 pm appointment tomorrow at [facility].

## 2022-04-23 ENCOUNTER — Ambulatory Visit (INDEPENDENT_AMBULATORY_CARE_PROVIDER_SITE_OTHER): Payer: No Payment, Other | Admitting: Licensed Clinical Social Worker

## 2022-04-23 DIAGNOSIS — F1020 Alcohol dependence, uncomplicated: Secondary | ICD-10-CM

## 2022-04-23 DIAGNOSIS — F332 Major depressive disorder, recurrent severe without psychotic features: Secondary | ICD-10-CM

## 2022-04-23 NOTE — Progress Notes (Signed)
   THERAPIST PROGRESS NOTE  Session Time: 30 minutes  Participation Level: Active  Behavioral Response: CasualAlertEuthymic  Type of Therapy: Individual Therapy  Treatment Goals addressed: Initial alcohol use  ProgressTowards Goals: Initial  Interventions: Motivational Interviewing  Summary: Howard Adams is a 36 y.o. male who presents with primary issue of alcohol use. He reports that he was discharged from [facility] on 04/22/2022 and has follow up appointment with psych provider on 05/21/2022. He is being seen today for CD-IOP group referral. CCA was completed on 04/20/2022 and is current. Howard Adams reports that he last drank a beer on the evening of 04/22/2022 after he was discharged from urgent care. He expresses interest in CD-IOP group and is educated by Clinician about group goals and objectives. Howard Adams reports that stress is a trigger for use and he reports that alcohol is the primary substance he uses. He reports that he has been drinking since 2009 off and on and reports that he had 90 days sobriety while in jail once and five days is longest he went on his own. Howard Adams reports that his long term goal is to abstain from use.  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    Suicidal/Homicidal: Nowithout intent/plan  Therapist Response: Last substance use assessed. Motivation for group and information provided. Client is agreeable to begin group on 04/25/22. Client is informed about current Clinician transitioning out of group and new facilitator will be coming in.  Plan: Return again in 2 days for group.  Diagnosis: Alcohol use disorder, severe, dependence (HCC)   Collaboration of Care: Medication Management AEB 05/21/2022  Patient/Guardian was advised Release of Information must be obtained prior to any record release in order to collaborate their care with an outside provider. Patient/Guardian was advised  if they have not already done so to contact the registration department to sign all necessary forms in order for [place] to release information regarding their care.   Consent: Patient/Guardian gives verbal consent for treatment and assignment of benefits for services provided during this visit. Patient/Guardian expressed understanding and agreed to proceed.   Cheri Fowler, [facility] 04/23/2022

## 2022-04-25 ENCOUNTER — Ambulatory Visit (INDEPENDENT_AMBULATORY_CARE_PROVIDER_SITE_OTHER): Payer: No Payment, Other | Admitting: Licensed Clinical Social Worker

## 2022-04-25 DIAGNOSIS — F1020 Alcohol dependence, uncomplicated: Secondary | ICD-10-CM | POA: Diagnosis not present

## 2022-04-28 ENCOUNTER — Ambulatory Visit (INDEPENDENT_AMBULATORY_CARE_PROVIDER_SITE_OTHER): Payer: No Payment, Other | Admitting: Licensed Clinical Social Worker

## 2022-04-28 DIAGNOSIS — F1020 Alcohol dependence, uncomplicated: Secondary | ICD-10-CM | POA: Diagnosis not present

## 2022-04-29 NOTE — Group Note (Signed)
Group Topic: Relapse Prevention  Group Date: 04/25/2022 Start Time:  9:00 AM End Time: 12:00 PM Facilitators: Cheri Fowler, [facility]  Department: [facility]  Number of Participants: 5  Group Focus: relapse prevention Treatment Modality:  Cognitive Behavioral Therapy Interventions utilized were patient education and problem solving Purpose: explore maladaptive thinking and relapse prevention strategies  Name: Howard Adams Date of Birth: [birth year]  MR: [id number]    Level of Participation: active Quality of Participation: attentive and cooperative Interactions with others: gave feedback Mood/Affect: appropriate Triggers (if applicable): [name] reports that his stressor is his family, car trouble and needing a job Cognition: coherent/clear Progress: Gaining insight Response: Client is alert and oriented to group. Client participates in group discussion about communication and stages of relapse. 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 No   Name of family member(s): 0  UDS collected: No   AA/NA attended?: No  Sponsor?: No

## 2022-04-29 NOTE — Group Note (Signed)
Group Topic: Substance Abuse Treatment  Group Date: 04/28/2022 Start Time:  9:00 AM End Time: 12:00 PM Facilitators: Cheri Fowler, [facility]  Department: [facility]  Number of Participants: 5  Group Focus: chemical dependency issues and substance abuse education Treatment Modality:  Cognitive Behavioral Therapy Interventions utilized were assignment, clarification, exploration, group exercise, and patient education Purpose: enhance coping skills, explore maladaptive thinking, express feelings, increase insight, and trigger / craving management  Name: Howard Adams Date of Birth: [birth year]  MR: [id number]    Level of Participation: active Quality of Participation: attentive Interactions with others: gave feedback Mood/Affect: appropriate Triggers (if applicable): No job, car trouble, girlfriend Cognition: coherent/clear Progress: Gaining insight Response: Client is alert and oriented to group. Client participates in group discussion about communication and stages of relapse. 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 No   Name of family member(s): 0  UDS collected: No Results: pending  AA/NA attended?: No  Sponsor?: No

## 2022-04-30 ENCOUNTER — Ambulatory Visit (INDEPENDENT_AMBULATORY_CARE_PROVIDER_SITE_OTHER): Payer: No Payment, Other | Admitting: Licensed Clinical Social Worker

## 2022-04-30 DIAGNOSIS — F1020 Alcohol dependence, uncomplicated: Secondary | ICD-10-CM

## 2022-04-30 NOTE — Group Note (Signed)
Group Topic: Stress Management  Group Date: 04/30/2022 Start Time:  9:00 AM End Time: 12:00 PM Facilitators: Cheri Fowler, [facility]  Department: [facility]  Number of Participants: 4  Group Focus: abuse issues, anger management, and substance abuse education Treatment Modality:  Cognitive Behavioral Therapy and Skills Training Interventions utilized were assignment, group exercise, and problem solving Purpose: explore maladaptive thinking and relapse prevention strategies  Name: Chirstopher Iovino Date of Birth: 12-18-1985  MR: [id number]    Level of Participation: active Quality of Participation: attentive, and cooperative Interactions with others: gave feedback Mood/Affect: appropriate Triggers (if applicable): Kingson reports that he has not had any triggers for use but he had a beer on Tuesday "because I just wanted to". Cognition: coherent/clear Progress: Minimal Response: Syris reports that he does not believe he has a problem with drinking alcohol but he abuses it. Clinician educated on withdrawals and problematic use and physiological dependence is addiction even if a user may still be able to function in their other life roles. He reports that his primary stressor and the main reason why alcohol is a problem for him is because his girlfriend does not want him to drink. Plan: patient will be encouraged to learn and practice effective coping skills and manage triggers for use.  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 No   Name of family member(s): 0  UDS collected: No   AA/NA  attended?: No  Sponsor?: No

## 2022-05-02 ENCOUNTER — Ambulatory Visit (HOSPITAL_COMMUNITY): Payer: Self-pay

## 2022-05-02 NOTE — Progress Notes (Signed)
Group Date: 04/30/2022 Group Title: [facility] CD-IOP Group Facilitator: Cheri Fowler LCMHC-S No group context. This SmartLink only works when in group documentation mode. Name: Howard Adams Date of Birth: 1986/03/22  MR: [id number]    Level of Participation: active Quality of Participation: attentive and cooperative Interactions with others: gave feedback Mood/Affect: appropriate and brightens with interaction Triggers (if applicable): [name] reports that he has not had any triggers for use but he had a beer on Tuesday "because I just wanted to". Cognition: coherent/clear Progress: Minimal Response: [name] reports that he does not believe he has a problem with drinking alcohol but he abuses it. Clinician educated on withdrawals and problematic use and physiological dependence is addiction even if a user may still be able to function in their other life roles. He reports that his primary stressor and the main reason why alcohol is a problem for him is because his girlfriend does not want him to drink. Plan: patient will be encouraged to learn and practice effective coping skills and manage triggers for use.  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 No   Name of family member(s): 0  UDS collected: No Results:   AA/NA attended?: No  Sponsor?: No

## 2022-05-21 ENCOUNTER — Ambulatory Visit (HOSPITAL_COMMUNITY): Payer: No Payment, Other | Admitting: Student

## 2022-06-15 ENCOUNTER — Emergency Department (HOSPITAL_COMMUNITY)
Admission: EM | Admit: 2022-06-15 | Discharge: 2022-06-15 | Disposition: A | Discharge status: Home / Self Care | Payer: Medicaid Other | Attending: Emergency Medicine | Admitting: Emergency Medicine

## 2022-06-15 ENCOUNTER — Emergency Department (HOSPITAL_COMMUNITY): Payer: Medicaid Other

## 2022-06-15 ENCOUNTER — Other Ambulatory Visit: Payer: Self-pay

## 2022-06-15 DIAGNOSIS — R0789 Other chest pain: Secondary | ICD-10-CM

## 2022-06-15 DIAGNOSIS — F17200 Nicotine dependence, unspecified, uncomplicated: Secondary | ICD-10-CM | POA: Insufficient documentation

## 2022-06-15 DIAGNOSIS — R079 Chest pain, unspecified: Secondary | ICD-10-CM | POA: Diagnosis present

## 2022-06-15 DIAGNOSIS — R0602 Shortness of breath: Secondary | ICD-10-CM | POA: Insufficient documentation

## 2022-06-15 DIAGNOSIS — K7689 Other specified diseases of liver: Secondary | ICD-10-CM | POA: Diagnosis not present

## 2022-06-15 LAB — CBC WITH DIFFERENTIAL/PLATELET
Abs Immature Granulocytes: 0 10*3/uL (ref 0.00–0.07)
Basophils Absolute: 0 10*3/uL (ref 0.0–0.1)
Basophils Relative: 1 %
Eosinophils Absolute: 0.1 10*3/uL (ref 0.0–0.5)
Eosinophils Relative: 1 %
HCT: 45.7 % (ref 39.0–52.0)
Hemoglobin: 16.3 g/dL (ref 13.0–17.0)
Immature Granulocytes: 0 %
Lymphocytes Relative: 45 %
Lymphs Abs: 2 10*3/uL (ref 0.7–4.0)
MCH: 33.3 pg (ref 26.0–34.0)
MCHC: 35.7 g/dL (ref 30.0–36.0)
MCV: 93.5 fL (ref 80.0–100.0)
Monocytes Absolute: 0.3 10*3/uL (ref 0.1–1.0)
Monocytes Relative: 6 %
Neutro Abs: 2 10*3/uL (ref 1.7–7.7)
Neutrophils Relative %: 47 %
Platelets: 210 10*3/uL (ref 150–400)
RBC: 4.89 MIL/uL (ref 4.22–5.81)
RDW: 12.6 % (ref 11.5–15.5)
WBC: 4.4 10*3/uL (ref 4.0–10.5)
nRBC: 0 % (ref 0.0–0.2)

## 2022-06-15 LAB — COMPREHENSIVE METABOLIC PANEL
ALT: 53 U/L — ABNORMAL HIGH (ref 0–44)
AST: 63 U/L — ABNORMAL HIGH (ref 15–41)
Albumin: 3 g/dL — ABNORMAL LOW (ref 3.5–5.0)
Alkaline Phosphatase: 57 U/L (ref 38–126)
Anion gap: 13 (ref 5–15)
BUN: <5 mg/dL — ABNORMAL LOW (ref 6–20)
CO2: 23 mmol/L (ref 22–32)
Calcium: 8.4 mg/dL — ABNORMAL LOW (ref 8.9–10.3)
Chloride: 103 mmol/L (ref 98–111)
Creatinine, Ser: 0.89 mg/dL (ref 0.61–1.24)
GFR, Estimated: >60 mL/min (ref >60)
Glucose, Bld: 105 mg/dL — ABNORMAL HIGH (ref 70–99)
Potassium: 4 mmol/L (ref 3.5–5.1)
Sodium: 139 mmol/L (ref 135–145)
Total Bilirubin: <0.1 mg/dL — ABNORMAL LOW (ref 0.3–1.2)
Total Protein: 5.9 g/dL — ABNORMAL LOW (ref 6.5–8.1)

## 2022-06-15 LAB — TROPONIN I (HIGH SENSITIVITY)
Troponin I (High Sensitivity): 6 ng/L (ref <18)
Troponin I (High Sensitivity): 6 ng/L (ref <18)

## 2022-06-15 LAB — ETHANOL: Alcohol, Ethyl (B): 332 mg/dL (ref <10)

## 2022-06-15 MED ORDER — IOHEXOL 350 MG/ML SOLN
75.0000 mL | Freq: Once | INTRAVENOUS | Status: AC | PRN
  Administered 2022-06-15: 75 mL via INTRAVENOUS

## 2022-06-15 NOTE — ED Provider Notes (Addendum)
[place] Provider Note   CSN: [id number] Arrival date & time: 06/15/22  0137     History  Chief Complaint  Patient presents with   Chest Pain    Howard Adams is a 37 y.o. male.  Patient is a 37 year old male who presents with chest pain.  On chart review, he has history of alcohol use disorder and substance abuse issues.  He is an everyday smoker.  He presents with a 3 to 4-day history of pain in his chest.  He is holding his hand over his sternal area and says that is where it is mostly hurting.  He says it hurts with any movement and with breathing.  He feels a little bit short of breath.  He had COVID over Christmas and then over [place] had the flu.  He says he no longer has much of a cough because it hurts to cough.  No leg pain or swelling.  No fevers.       Home Medications Prior to Admission medications   Not on File      Allergies    Patient has no known allergies.    Review of Systems   Review of Systems  Constitutional:  Negative for chills, diaphoresis, fatigue and fever.  HENT:  Negative for congestion, rhinorrhea and sneezing.   Eyes: Negative.   Respiratory:  Positive for shortness of breath. Negative for cough and chest tightness.   Cardiovascular:  Positive for chest pain. Negative for leg swelling.  Gastrointestinal:  Negative for abdominal pain, blood in stool, diarrhea, nausea and vomiting.  Genitourinary:  Negative for difficulty urinating, flank pain, frequency and hematuria.  Musculoskeletal:  Negative for arthralgias and back pain.  Skin:  Negative for rash.  Neurological:  Negative for dizziness, speech difficulty, weakness, numbness and headaches.    Physical Exam Updated Vital Signs BP (!) 126/94   Pulse 83   Temp 97.8 F (36.6 C)   Resp (!) 21   SpO2 97%  Physical Exam Constitutional:      Appearance: He is well-developed.  HENT:     Head: Normocephalic and atraumatic.  Eyes:      Pupils: Pupils are equal, round, and reactive to light.  Cardiovascular:     Rate and Rhythm: Normal rate and regular rhythm.     Heart sounds: Normal heart sounds.  Pulmonary:     Effort: Pulmonary effort is normal. No respiratory distress.     Breath sounds: Normal breath sounds. No wheezing or rales.  Chest:     Chest wall: No tenderness.  Abdominal:     General: Bowel sounds are normal.     Palpations: Abdomen is soft.     Tenderness: There is no abdominal tenderness. There is no guarding or rebound.  Musculoskeletal:        General: Normal range of motion.     Cervical back: Normal range of motion and neck supple.     Comments: No edema or calf tenderness  Lymphadenopathy:     Cervical: No cervical adenopathy.  Skin:    General: Skin is warm and dry.     Findings: No rash.  Neurological:     Mental Status: He is alert and oriented to person, place, and time.     ED Results / Procedures / Treatments   Labs (all labs ordered are listed, but only abnormal results are displayed) Labs Reviewed  COMPREHENSIVE METABOLIC PANEL - Abnormal; Notable for the following  components:      Result Value   Glucose, Bld 105 (*)    BUN <5 (*)    Calcium 8.4 (*)    Total Protein 5.9 (*)    Albumin 3.0 (*)    AST 63 (*)    ALT 53 (*)    Total Bilirubin <0.1 (*)    All other components within normal limits  ETHANOL - Abnormal; Notable for the following components:   Alcohol, Ethyl (B) 332 (*)    All other components within normal limits  CBC WITH DIFFERENTIAL/PLATELET  TROPONIN I (HIGH SENSITIVITY)  TROPONIN I (HIGH SENSITIVITY)    EKG EKG Interpretation  Date/Time:  Sunday June 15 2022 01:49:49 EST Ventricular Rate:  82 PR Interval:  150 QRS Duration: 76 QT Interval:  370 QTC Calculation: 432 R Axis:   77 Text Interpretation: Normal sinus rhythm with sinus arrhythmia Septal infarct , age undetermined Abnormal ECG When compared with ECG of 20-Apr-2022 23:11, PREVIOUS ECG  IS PRESENT since last tracing no significant change Confirmed by Rolan Bucco [phone]) on 06/15/2022 7:49:05 AM  Radiology CT Angio Chest PE W/Cm &/Or Wo Cm  Result Date: 06/15/2022 CLINICAL DATA:  Chest pain with breathing.  Evaluate for PE. EXAM: CT ANGIOGRAPHY CHEST WITH CONTRAST TECHNIQUE: Multidetector CT imaging of the chest was performed using the standard protocol during bolus administration of intravenous contrast. Multiplanar CT image reconstructions and MIPs were obtained to evaluate the vascular anatomy. RADIATION DOSE REDUCTION: This exam was performed according to the departmental dose-optimization program which includes automated exposure control, adjustment of the mA and/or kV according to patient size and/or use of iterative reconstruction technique. CONTRAST:  27mL OMNIPAQUE IOHEXOL 350 MG/ML SOLN COMPARISON:  None Available. FINDINGS: Cardiovascular: The heart size is normal. No substantial pericardial effusion. Bolus timing for the pulmonary arteries is suboptimal. Within this limitation there is no large central pulmonary embolus in the main or lobar pulmonary arteries. No discernible embolus in segmental branches although assessment of segmental and subsegmental pulmonary arteries may be unreliable. Mediastinum/Nodes: No mediastinal lymphadenopathy. Calcified lymph nodes are seen in the mediastinum and left hilum. The esophagus has normal imaging features. There is no axillary lymphadenopathy. Lungs/Pleura: 2 mm right upper lobe nodule seen on 30/6/6. Calcified granuloma noted left lower lobe. No focal airspace consolidation. No pleural effusion. No findings to suggest pulmonary edema. Upper Abdomen: 16 mm hypoattenuating lesion in the lateral right liver (122/5) is new since abdomen CT 08/12/2020 visualized upper abdomen otherwise unremarkable. Musculoskeletal: No worrisome lytic or sclerotic osseous abnormality. Sternum and manubrium are intact. No evidence for rib fracture Review of the  MIP images confirms the above findings. IMPRESSION: 1. Bolus timing for the pulmonary arteries is suboptimal. Within this limitation there is no large central pulmonary embolus in the main or lobar pulmonary arteries. No discernible embolus in segmental branches although assessment of segmental and subsegmental pulmonary arteries may be unreliable. 2. 16 mm hypoattenuating lesion in the lateral right liver is new since abdomen CT 08/12/2020. This is indeterminate. MRI of the abdomen with and without contrast recommended to further evaluate. 3. 2 mm right upper lobe pulmonary nodule. No follow-up needed if patient is low-risk.This recommendation follows the consensus statement: Guidelines for Management of Incidental Pulmonary Nodules Detected on CT Images: From the Fleischner Society 2017; Radiology 2017; 284:228-243. Electronically Signed   By: Kennith Center M.D.   On: 06/15/2022 10:55   DG Chest 2 View  Result Date: 06/15/2022 CLINICAL DATA:  Chest pain EXAM: CHEST -  2 VIEW COMPARISON:  01/14/2021 FINDINGS: The heart size and mediastinal contours are within normal limits. Both lungs are clear. The visualized skeletal structures are unremarkable. IMPRESSION: No active cardiopulmonary disease. Electronically Signed   By: Ulyses Jarred M.D.   On: 06/15/2022 02:46    Procedures Procedures    Medications Ordered in ED Medications  iohexol (OMNIPAQUE) 350 MG/ML injection 75 mL (75 mLs Intravenous Contrast Given 06/15/22 1043)    ED Course/ Medical Decision Making/ A&P                             Medical Decision Making Amount and/or Complexity of Data Reviewed Radiology: ordered.  Risk Prescription drug management.   Patient is a 37 year old who presents with chest pain.  It is worse with movement and reproducible on palpation.  He denies any known trauma but he did come in with a very elevated alcohol level.  He had an EKG did not show any ischemic changes.  He has had 2 negative troponins.   No other symptoms that sound more concerning for ACS.  He had a chest x-ray two-view which showed no acute abnormalities.  No evidence of pneumonia.  No pneumothorax.  This was interpreted by me and confirmed by the radiologist.  Given his pleuritic nature of the pain, CT was performed.  There is no evidence of PE.  No evidence of rib fracture or sternal fracture.  I feel that the pain is likely musculoskeletal in nature.  Possibly from his recent COVID/flu.  His CT did show a liver nodule that we will need outpatient follow-up.  This was relayed to the patient.  Return precautions were given.  His alcohol level was elevated on arrival but he was here for about 9 hours and clinically sober at the time of discharge.  Also noted that he is a smoker so was given information about his pulmonary nodule that can also be followed up as an outpatient.  Final Clinical Impression(s) / ED Diagnoses Final diagnoses:  Chest wall pain  Liver nodule    Rx / DC Orders ED Discharge Orders     None         Malvin Johns, MD 06/15/22 1118    Malvin Johns, MD 06/15/22 1135

## 2022-06-15 NOTE — ED Provider Triage Note (Signed)
Emergency Medicine Provider Triage Evaluation Note  Elion Hocker , a 37 y.o. male  was evaluated in triage.  Pt complains of chest pain.  States recent covid infection, followed by flu. No longer having any URI symptoms but today started having chest pain.  States chest hurts to touch.  Has been drinking today, longstanding hx alcohol abuse.  Denies drug use.    Review of Systems  Positive: Chest pain Negative: fever  Physical Exam  BP (!) 139/99 (BP Location: Left Arm)   Pulse 80   Temp 97.7 F (36.5 C) (Oral)   Resp 19   SpO2 100%   Gen:   Awake, no distress   Resp:  Normal effort  MSK:   Moves extremities without difficulty  Other:    Medical Decision Making  Medically screening exam initiated at 1:43 AM.  Appropriate orders placed.  Kanton Kamel was informed that the remainder of the evaluation will be completed by another provider, this initial triage assessment does not replace that evaluation, and the importance of remaining in the ED until their evaluation is complete.  Chest pain.  Recent covid/flu but not currently having URI symptoms.  Does have EtOH on board.  EKG, labs, CXR.   Larene Pickett, PA-C 06/15/22 [id number]

## 2022-06-15 NOTE — Discharge Instructions (Addendum)
You have a nodule in your liver that needs outpatient follow-up.  You need to follow-up with a primary care provider to order an MRI of this nodule.

## 2022-06-15 NOTE — ED Triage Notes (Signed)
Pt arrives via POV c/o ongoing presisent left sided chest pain x 2 days. Endorses pain with breathing and palpation. Recently dx COVID and Flu.

## 2022-07-02 ENCOUNTER — Ambulatory Visit (HOSPITAL_COMMUNITY): Payer: Medicaid Other | Admitting: Student
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
## 2022-07-21 ENCOUNTER — Encounter: Payer: Self-pay | Admitting: Nurse Practitioner

## 2022-07-21 ENCOUNTER — Ambulatory Visit: Payer: Medicaid Other | Admitting: Nurse Practitioner

## 2022-07-21 VITALS — BP 138/70 | HR 102 | Temp 97.7°F | Ht 66.0 in | Wt 154.4 lb

## 2022-07-21 DIAGNOSIS — F32A Depression, unspecified: Secondary | ICD-10-CM | POA: Insufficient documentation

## 2022-07-21 DIAGNOSIS — K769 Liver disease, unspecified: Secondary | ICD-10-CM

## 2022-07-21 DIAGNOSIS — K089 Disorder of teeth and supporting structures, unspecified: Secondary | ICD-10-CM | POA: Diagnosis not present

## 2022-07-21 DIAGNOSIS — F332 Major depressive disorder, recurrent severe without psychotic features: Secondary | ICD-10-CM

## 2022-07-21 DIAGNOSIS — Z1159 Encounter for screening for other viral diseases: Secondary | ICD-10-CM

## 2022-07-21 DIAGNOSIS — G8929 Other chronic pain: Secondary | ICD-10-CM

## 2022-07-21 DIAGNOSIS — F1210 Cannabis abuse, uncomplicated: Secondary | ICD-10-CM

## 2022-07-21 DIAGNOSIS — F1010 Alcohol abuse, uncomplicated: Secondary | ICD-10-CM | POA: Diagnosis not present

## 2022-07-21 DIAGNOSIS — R7989 Other specified abnormal findings of blood chemistry: Secondary | ICD-10-CM

## 2022-07-21 DIAGNOSIS — Z716 Tobacco abuse counseling: Secondary | ICD-10-CM

## 2022-07-21 MED ORDER — AMOXICILLIN 875 MG PO TABS: 875.0000 mg | ORAL_TABLET | Freq: Two times a day (BID) | ORAL | 0 refills | Status: AC

## 2022-07-21 NOTE — Assessment & Plan Note (Addendum)
[place] Office Visit from 07/21/2022 in [place]  PHQ-9 Total Score 8     He related this to his alcohol abuse He denies any SI,HI He is already established with a counselor at the [facility] but stated that this is not been helpful he would like to be speaking with another  counselor.

## 2022-07-21 NOTE — Assessment & Plan Note (Addendum)
  Lab Results  Component Value Date   ALT 53 (H) 06/15/2022   AST 63 (H) 06/15/2022   ALKPHOS 57 06/15/2022   BILITOT <0.1 (L) 06/15/2022  Rechecking hepatic panel today Alcohol cessation encouraged Screening for hepatitis C

## 2022-07-21 NOTE — Assessment & Plan Note (Signed)
Smokes about 0.5 pack/day  Asked about quitting: confirms that he/she currently smokes cigarettes Advise to quit smoking: Educated about QUITTING to reduce the risk of cancer, cardio and cerebrovascular disease. Assess willingness: Unwilling to quit at this time, but is working on cutting back. Assist with counseling and pharmacotherapy: Counseled for 5 minutes and literature provided. Arrange for follow up: follow up in 3-6 months and continue to offer help.

## 2022-07-21 NOTE — Progress Notes (Signed)
New Patient Office Visit  Subjective:  Patient ID: Howard Adams, male    DOB: 02/28/1986  Age: 37 y.o. MRN: [id number]  CC:  Chief Complaint  Patient presents with   Establish Care    MRI nodule on liver.    HPI Howard Adams is a 37 y.o. male with past medical history of alcohol abuse, tobacco abuse, marijuana abuse who presents to establish care for his chronic medical conditions.      Liver nodule. he was at the emergency room about a month ago for complaints of chest pain, CT scan done already emergency room shows liver nodule there was a recommendation for out patient follow-up with MRI.  Patient denies fever, malaise abdominal pain nausea vomiting chest pain shortness of breath wheezing dizziness.  Drinks 3 to 4 cans of beer daily.    Depression.  Is currently established with behavioral health specialist but stated that he would like to be seen a counselor instead stated that his sessions with them has not been helpful.  He has been depressed due to his drinking habit has been having arguments about his drinking with his spouse.  He denies SI, HI.   Patient complains of dental pain worse since the last 2 months, stated that it has been hard for him to chew, sometimes has chills but no fever.     Past Medical History:  Diagnosis Date   Asthma      History reviewed. No pertinent surgical history.  Family History  Problem Relation Age of Onset   Hypertension Father     Social History   Socioeconomic History   Marital status: Significant Other    Spouse name: Not on file   Number of children: 3   Years of education: Not on file   Highest education level: Not on file  Occupational History   Not on file  Tobacco Use   Smoking status: Every Day    Packs/day: 0.50    Years: 10.00    Total pack years: 5.00    Types: Cigarettes   Smokeless tobacco: Never  Vaping Use   Vaping Use: Unknown  Substance and Sexual Activity   Alcohol use: Yes    Alcohol/week: 4.0  standard drinks of alcohol    Types: 4 Cans of beer per week    Comment: 4-5 cans a week   Drug use: Yes    Types: Marijuana    Comment: 3 times a week   Sexual activity: Yes    Birth control/protection: Condom  Other Topics Concern   Not on file  Social History Narrative   Lives with his girlfriend    Social Determinants of Health   Financial Resource Strain: Unknown (03/28/2019)   Overall Financial Resource Strain (CARDIA)    Difficulty of Paying Living Expenses: Patient refused  Food Insecurity: Unknown (03/28/2019)   Hunger Vital Sign    Worried About [place] in the Last Year: Patient refused    [place] in the Last Year: Patient refused  Transportation Needs: Unknown (03/28/2019)   [place] - Transportation    Lack of Transportation (Medical): Patient refused    Lack of Transportation (Non-Medical): Patient refused  Physical Activity: Unknown (03/28/2019)   Exercise Vital Sign    Days of Exercise per Week: Patient refused    Minutes of Exercise per Session: Patient refused  Stress: Stress Concern Present (03/28/2019)   [place]    Feeling of  Stress : Very much  Social Connections: Unknown (03/28/2019)   Social Connection and Isolation Panel [NHANES]    Frequency of Communication with Friends and Family: Patient refused    Frequency of Social Gatherings with Friends and Family: Patient refused    Attends Religious Services: Patient refused    Active Member of Clubs or Organizations: Patient refused    Attends Archivist Meetings: Patient refused    Marital Status: Patient refused  Intimate Partner Violence: Unknown (03/28/2019)   Humiliation, Afraid, Rape, and Kick questionnaire    Fear of Current or Ex-Partner: Patient refused    Emotionally Abused: Patient refused    Physically Abused: Patient refused    Sexually Abused: Patient refused    ROS Review of Systems   Constitutional:  Positive for chills. Negative for activity change, appetite change, diaphoresis and fever.  HENT:  Positive for dental problem. Negative for congestion, ear pain, facial swelling, hearing loss and mouth sores.   Respiratory: Negative.    Cardiovascular: Negative.   Gastrointestinal: Negative.   Musculoskeletal: Negative.   Skin: Negative.   Neurological: Negative.   Hematological: Negative.   Psychiatric/Behavioral:  Negative for agitation, confusion, self-injury and suicidal ideas.     Objective:   Today's Vitals: BP 138/70   Pulse (!) 102   Temp 97.7 F (36.5 C)   Ht '5\' 6"'$  (1.676 m)   Wt 154 lb 6.4 oz (70 kg)   SpO2 97%   BMI 24.92 kg/m   Physical Exam Constitutional:      General: He is not in acute distress.    Appearance: He is not ill-appearing, toxic-appearing or diaphoretic.  HENT:     Right Ear: Tympanic membrane, ear canal and external ear normal. There is no impacted cerumen.     Left Ear: Tympanic membrane, ear canal and external ear normal. There is no impacted cerumen.     Mouth/Throat:     Mouth: Mucous membranes are moist.     Pharynx: Oropharynx is clear. No oropharyngeal exudate or posterior oropharyngeal erythema.     Comments: Poor dental hygiene, broken teeth, swollen gum noted Eyes:     General: No scleral icterus.       Right eye: No discharge.        Left eye: No discharge.     Extraocular Movements: Extraocular movements intact.  Cardiovascular:     Rate and Rhythm: Normal rate and regular rhythm.     Pulses: Normal pulses.     Heart sounds: Normal heart sounds. No murmur heard.    No friction rub. No gallop.  Pulmonary:     Effort: Pulmonary effort is normal. No respiratory distress.     Breath sounds: Normal breath sounds. No stridor. No wheezing, rhonchi or rales.  Chest:     Chest wall: No tenderness.  Abdominal:     General: There is no distension.     Palpations: Abdomen is soft. There is no mass.     Tenderness:  There is no abdominal tenderness. There is no right CVA tenderness, left CVA tenderness, guarding or rebound.     Hernia: No hernia is present.  Musculoskeletal:        General: No swelling, tenderness, deformity or signs of injury.     Right lower leg: No edema.     Left lower leg: No edema.  Skin:    General: Skin is warm and dry.     Capillary Refill: Capillary refill takes less than 2 seconds.  Coloration: Skin is not jaundiced or pale.     Findings: No bruising or lesion.  Neurological:     Mental Status: He is alert and oriented to person, place, and time.     Cranial Nerves: No cranial nerve deficit.     Sensory: No sensory deficit.     Motor: No weakness.     Coordination: Coordination normal.     Gait: Gait normal.  Psychiatric:        Mood and Affect: Mood normal.        Behavior: Behavior normal.        Thought Content: Thought content normal.        Judgment: Judgment normal.     Assessment & Plan:   Problem List Items Addressed This Visit       Digestive   Liver lesion - Primary    Noted on her recent CT scan Follow-up MRI ordered today.      Relevant Orders   MR Abdomen W Wo Contrast     Other   Major depressive disorder, recurrent severe without psychotic features ([place])    [place] Office Visit from 07/21/2022 in [place]  PHQ-9 Total Score 8     He related this to his alcohol abuse He denies any SI,HI He is already established with a counselor at the [facility] but stated that this is not been helpful he would like to be speaking with another  counselor.       Alcohol abuse    Alcohol cessation encouraged      Tobacco abuse counseling    Smokes about 0.5 pack/day  Asked about quitting: confirms that he/she currently smokes cigarettes Advise to quit smoking: Educated about QUITTING to reduce the risk of cancer, cardio and cerebrovascular disease. Assess willingness: Unwilling to quit at this time, but is  working on cutting back. Assist with counseling and pharmacotherapy: Counseled for 5 minutes and literature provided. Arrange for follow up: follow up in 3-6 months and continue to offer help.       Chronic dental pain     - Ambulatory referral to Dentistry - amoxicillin (AMOXIL) 875 MG tablet; Take 1 tablet (875 mg total) by mouth 2 (two) times daily for 10 days.  Dispense: 20 tablet; Refill: 0         Relevant Medications   amoxicillin (AMOXIL) 875 MG tablet   Other Relevant Orders   Ambulatory referral to Dentistry   Elevated LFTs       Lab Results  Component Value Date   ALT 53 (H) 06/15/2022   AST 63 (H) 06/15/2022   ALKPHOS 57 06/15/2022   BILITOT <0.1 (L) 06/15/2022  Rechecking hepatic panel today Alcohol cessation encouraged Screening for hepatitis C      Relevant Orders   Hepatic function panel   Marijuana abuse    Smokes marijuana 3 times weekly Need to quit smoking marijuana discussed with the patient he verbalized understanding      Other Visit Diagnoses     Need for hepatitis C screening test       Relevant Orders   Hepatitis C antibody       Outpatient Encounter Medications as of 07/21/2022  Medication Sig   amoxicillin (AMOXIL) 875 MG tablet Take 1 tablet (875 mg total) by mouth 2 (two) times daily for 10 days.   No facility-administered encounter medications on file as of 07/21/2022.    Follow-up: Return in about 6 months (  around 01/19/2023) for depression, alcohol abuse .   Renee Rival, FNP

## 2022-07-21 NOTE — Patient Instructions (Signed)
1. Liver lesion  - MR Abdomen W Wo Contrast; Future  2. Chronic dental pain  - Ambulatory referral to Dentistry - amoxicillin (AMOXIL) 875 MG tablet; Take 1 tablet (875 mg total) by mouth 2 (two) times daily for 10 days.  Dispense: 20 tablet; Refill: 0   It is important that you exercise regularly at least 30 minutes 5 times a week as tolerated  Think about what you will eat, plan ahead. Choose " clean, green, fresh or frozen" over canned, processed or packaged foods which are more sugary, salty and fatty. 70 to 75% of food eaten should be vegetables and fruit. Three meals at set times with snacks allowed between meals, but they must be fruit or vegetables. Aim to eat over a 12 hour period , example 7 am to 7 pm, and STOP after  your last meal of the day. Drink water,generally about 64 ounces per day, no other drink is as healthy. Fruit juice is best enjoyed in a healthy way, by EATING the fruit.  Thanks for choosing [facility] we consider it a privelige to serve you.

## 2022-07-21 NOTE — Assessment & Plan Note (Signed)
Noted on her recent CT scan Follow-up MRI ordered today.

## 2022-07-21 NOTE — Assessment & Plan Note (Signed)
Alcohol cessation encouraged

## 2022-07-21 NOTE — Assessment & Plan Note (Signed)
-   Ambulatory referral to Dentistry - amoxicillin (AMOXIL) 875 MG tablet; Take 1 tablet (875 mg total) by mouth 2 (two) times daily for 10 days.  Dispense: 20 tablet; Refill: 0

## 2022-07-21 NOTE — Assessment & Plan Note (Addendum)
Smokes marijuana 3 times weekly Need to quit smoking marijuana discussed with the patient he verbalized understanding

## 2022-07-26 LAB — HEPATITIS C ANTIBODY: Hep C Virus Ab: NONREACTIVE

## 2022-07-29 ENCOUNTER — Other Ambulatory Visit: Payer: Self-pay | Admitting: Nurse Practitioner

## 2022-07-29 DIAGNOSIS — R7989 Other specified abnormal findings of blood chemistry: Secondary | ICD-10-CM

## 2022-08-01 NOTE — Progress Notes (Signed)
Lab results mail it to pt home address. [facility]

## 2022-08-05 ENCOUNTER — Institutional Professional Consult (permissible substitution): Payer: Medicaid Other | Admitting: Clinical

## 2022-08-18 ENCOUNTER — Ambulatory Visit (HOSPITAL_COMMUNITY)
Admission: RE | Admit: 2022-08-18 | Discharge: 2022-08-18 | Disposition: A | Discharge status: Home / Self Care | Payer: Medicaid Other | Source: Ambulatory Visit | Attending: Nurse Practitioner | Admitting: Nurse Practitioner

## 2022-08-18 DIAGNOSIS — K769 Liver disease, unspecified: Secondary | ICD-10-CM | POA: Diagnosis not present

## 2022-08-18 MED ORDER — GADOBUTROL 1 MMOL/ML IV SOLN
7.0000 mL | Freq: Once | INTRAVENOUS | Status: AC | PRN
  Administered 2022-08-18: 7 mL via INTRAVENOUS

## 2022-09-04 ENCOUNTER — Encounter: Payer: Self-pay | Admitting: Gastroenterology

## 2022-10-22 ENCOUNTER — Other Ambulatory Visit: Payer: Self-pay

## 2022-10-22 ENCOUNTER — Emergency Department (HOSPITAL_COMMUNITY)
Admission: EM | Admit: 2022-10-22 | Discharge: 2022-10-22 | Disposition: A | Discharge status: Home / Self Care | Payer: Medicaid Other | Attending: Emergency Medicine | Admitting: Emergency Medicine

## 2022-10-22 ENCOUNTER — Emergency Department (HOSPITAL_COMMUNITY): Payer: Medicaid Other

## 2022-10-22 DIAGNOSIS — S96902A Unspecified injury of unspecified muscle and tendon at ankle and foot level, left foot, initial encounter: Secondary | ICD-10-CM | POA: Diagnosis present

## 2022-10-22 DIAGNOSIS — S93402A Sprain of unspecified ligament of left ankle, initial encounter: Secondary | ICD-10-CM | POA: Diagnosis not present

## 2022-10-22 DIAGNOSIS — X501XXA Overexertion from prolonged static or awkward postures, initial encounter: Secondary | ICD-10-CM | POA: Insufficient documentation

## 2022-10-22 DIAGNOSIS — Y92007 Garden or yard of unspecified non-institutional (private) residence as the place of occurrence of the external cause: Secondary | ICD-10-CM | POA: Diagnosis not present

## 2022-10-22 MED ORDER — OXYCODONE-ACETAMINOPHEN 5-325 MG PO TABS
1.0000 | ORAL_TABLET | Freq: Once | ORAL | Status: AC
  Administered 2022-10-22: 1 via ORAL
  Filled 2022-10-22: qty 1

## 2022-10-22 MED ORDER — IBUPROFEN 800 MG PO TABS
800.0000 mg | ORAL_TABLET | Freq: Once | ORAL | Status: AC
  Administered 2022-10-22: 800 mg via ORAL
  Filled 2022-10-22: qty 1

## 2022-10-22 MED ORDER — ACETAMINOPHEN 325 MG PO TABS
650.0000 mg | ORAL_TABLET | Freq: Once | ORAL | Status: AC
  Administered 2022-10-22: 650 mg via ORAL
  Filled 2022-10-22: qty 2

## 2022-10-22 NOTE — Progress Notes (Signed)
Orthopedic Tech Progress Note Patient Details:  Howard Adams [birth year] [id number]  Ortho Devices Type of Ortho Device: ASO, Crutches Ortho Device/Splint Location: lle Ortho Device/Splint Interventions: Ordered, Application, Adjustment   Post Interventions Patient Tolerated: Well Instructions Provided: Adjustment of device, Care of device, Poper ambulation with device  Kawthar Ennen L Jerrald Doverspike 10/22/2022, 4:20 AM

## 2022-10-22 NOTE — ED Triage Notes (Signed)
Pt BIBEMS w/ c/o Lt ankle pain & swelling. As per pt he was doing back flips with his kids approx 8pm, stated he land wrong on his lt leg .

## 2022-10-22 NOTE — ED Provider Notes (Signed)
[facility] EMERGENCY DEPARTMENT AT [facility] Provider Note   CSN: [id number] Arrival date & time: 10/22/22  0235     History  Chief Complaint  Patient presents with   Joint Swelling    Howard Adams is a 37 y.o. male.  The history is provided by the patient and medical records.   37 y.o. M here with left ankle and foot pain.  Patient states he was doing flips in the backyard with his kids around 8 PM last evening when he landed awkwardly and rolled his left ankle.  He has had worsening pain and swelling since this began.  He has tried elevating and taking Tylenol without much relief.  Pain mostly in the ankle but feels it all throughout the foot as well.  Home Medications Prior to Admission medications   Not on File      Allergies    Shellfish allergy    Review of Systems   Review of Systems  Musculoskeletal:  Positive for arthralgias.  All other systems reviewed and are negative.   Physical Exam Updated Vital Signs BP (!) 145/89 (BP Location: Left Arm)   Pulse 98   Temp 98.8 F (37.1 C) (Oral)   Resp 20   Ht 5\' 6"  (1.676 m)   Wt 71.2 kg   SpO2 100%   BMI 25.34 kg/m   Physical Exam Vitals and nursing note reviewed.  Constitutional:      Appearance: He is well-developed.  HENT:     Head: Normocephalic and atraumatic.  Eyes:     Conjunctiva/sclera: Conjunctivae normal.     Pupils: Pupils are equal, round, and reactive to light.  Cardiovascular:     Rate and Rhythm: Normal rate and regular rhythm.     Heart sounds: Normal heart sounds.  Pulmonary:     Effort: Pulmonary effort is normal.     Breath sounds: Normal breath sounds.  Abdominal:     General: Bowel sounds are normal.     Palpations: Abdomen is soft.  Musculoskeletal:        General: Normal range of motion.     Cervical back: Normal range of motion.     Comments: Left ankle with swelling around the lateral and medial malleoli, there is no acute deformity, some swelling to arch of  the foot as well without deformity, DP pulse intact, able to wiggle toes on command, normal sensation to the toes  Skin:    General: Skin is warm and dry.  Neurological:     Mental Status: He is alert and oriented to person, place, and time.     ED Results / Procedures / Treatments   Labs (all labs ordered are listed, but only abnormal results are displayed) Labs Reviewed - No data to display  EKG None  Radiology DG Foot Complete Left  Result Date: 10/22/2022 CLINICAL DATA:  Injury doing flips EXAM: LEFT FOOT - COMPLETE 3+ VIEW COMPARISON:  None Available. FINDINGS: There is no evidence of fracture or dislocation. There is no evidence of arthropathy or other focal bone abnormality. Soft tissues are unremarkable. IMPRESSION: Negative. Electronically Signed   By: Charlett Nose M.D.   On: 10/22/2022 03:26   DG Ankle Complete Left  Result Date: 10/22/2022 CLINICAL DATA:  Injury doing flips EXAM: LEFT ANKLE COMPLETE - 3+ VIEW COMPARISON:  None Available. FINDINGS: There is no evidence of fracture, dislocation, or joint effusion. There is no evidence of arthropathy or other focal bone abnormality. Soft tissues are unremarkable.  IMPRESSION: Negative. Electronically Signed   By: Charlett Nose M.D.   On: 10/22/2022 03:26    Procedures Procedures    Medications Ordered in ED Medications  oxyCODONE-acetaminophen (PERCOCET/ROXICET) 5-325 MG per tablet 1 tablet (1 tablet Oral Given 10/22/22 0302)  acetaminophen (TYLENOL) tablet 650 mg (650 mg Oral Given 10/22/22 0517)  ibuprofen (ADVIL) tablet 800 mg (800 mg Oral Given 10/22/22 1610)    ED Course/ Medical Decision Making/ A&P                             Medical Decision Making Amount and/or Complexity of Data Reviewed Radiology: ordered and independent interpretation performed.  Risk OTC drugs. Prescription drug management.   37 year old male here with left ankle and foot pain after twisting his ankle doing flips in the yard with his  children.  This occurred several hours ago, worsening pain and swelling.  Does have swelling around the lateral and medial ankle but there is no acute deformity.  Some slight swelling along the arch of the foot as well.  Foot is neurovascularly intact, wiggling toes on command.  X-ray of ankle and foot negative for any acute findings.  Placed in ASO and given crutches.  Encouraged to ice/elevate, progress back to weight bearing as tolerated.  Given orthopedic follow-up if ongoing issues.  Return here for any new/acute changes.  Final Clinical Impression(s) / ED Diagnoses Final diagnoses:  Sprain of left ankle, unspecified ligament, initial encounter    Rx / DC Orders ED Discharge Orders     None         Garlon Hatchet, PA-C 10/22/22 0630    Nira Conn, MD 10/22/22 [phone]

## 2022-10-22 NOTE — Discharge Instructions (Signed)
Wear brace for now, progress back to weight bearing as tolerated. Ice and elevate to keep swelling down, tylenol or motrin as needed for pain. Can follow-up with Dr. Jena Gauss if we have ongoing issues. Return here for new concerns.

## 2022-10-23 ENCOUNTER — Telehealth: Payer: Self-pay

## 2022-10-23 NOTE — Transitions of Care (Post Inpatient/ED Visit) (Signed)
   10/23/2022  Name: Howard Adams MRN: [id number] DOB: 1986-03-05  Today's TOC FU Call Status: Unsuccessful Call (1st Attempt) Date: 10/23/22  Attempted to reach the patient regarding the most recent Inpatient/ED visit.  Follow Up Plan: Additional outreach attempts will be made to reach the patient to complete the Transitions of Care (Post Inpatient/ED visit) call.   Signature Renelda Loma RMA

## 2022-10-24 ENCOUNTER — Telehealth: Payer: Self-pay

## 2022-10-24 NOTE — Transitions of Care (Post Inpatient/ED Visit) (Cosign Needed)
   10/24/2022  Name: Howard Adams MRN: [id number] DOB: Oct 23, 1985  Today's TOC FU Call Status: Today's TOC FU Call Status:: Successful TOC FU Call Competed Unsuccessful Call (2nd Attempt) Date: 10/24/22 [facility] FU Call Complete Date: 10/24/22  Transition Care Management Follow-up Telephone Call Discharge Facility: [facility]) Type of Discharge: Emergency Department Reason for ED Visit: Orthopedic Conditions Orthopedic/Injury Diagnosis: Sprain or Strain How have you been since you were released from the hospital?: Better Any questions or concerns?: No  Items Reviewed: Did you receive and understand the discharge instructions provided?: Yes Medications obtained,verified, and reconciled?: Yes (Medications Reviewed) Any new allergies since your discharge?: No Dietary orders reviewed?: NA Do you have support at home?: No  Medications Reviewed Today: Medications Reviewed Today     Reviewed by Renelda Loma, RMA (Registered Medical Assistant) on 10/24/22 at 1215  Med List Status: <None>   Medication Order Taking? Sig Documenting Provider Last Dose Status Informant           No Medications to Display                  Med List Note Art Buff, CPhT 04/21/22 8295): No reported meds            Home Care and Equipment/Supplies: Were Home Health Services Ordered?: NA Any new equipment or medical supplies ordered?: NA  Functional Questionnaire: Do you need assistance with bathing/showering or dressing?: No Do you need assistance with meal preparation?: No Do you need assistance with eating?: No Do you have difficulty maintaining continence: No Do you need assistance with getting out of bed/getting out of a chair/moving?: No Do you have difficulty managing or taking your medications?: No  Follow up appointments reviewed: PCP Follow-up appointment confirmed?: Yes Date of PCP follow-up appointment?: 10/28/22 Follow[facility]up Provider: [facility]  Follow-up appointment confirmed?: NA Do you need transportation to your follow-up appointment?: No Do you understand care options if your condition(s) worsen?: Yes-patient verbalized understanding  SDOH Interventions Today    Flowsheet Row Most Recent Value  SDOH Interventions   Food Insecurity Interventions Other (Comment)  [will be referred to Susan]  Housing Interventions Intervention Not Indicated  Financial Strain Interventions Other (Comment)  [will set up with Susan]       SIGNATURE Renelda Loma RMA

## 2022-10-28 ENCOUNTER — Inpatient Hospital Stay: Payer: Self-pay | Admitting: Nurse Practitioner

## 2022-11-12 ENCOUNTER — Ambulatory Visit: Payer: Medicaid Other | Admitting: Gastroenterology

## 2022-11-12 NOTE — Progress Notes (Deleted)
[facility] Consult Note:  History: Howard Adams 11/12/2022  Referring provider: Donell Beers, FNP  Reason for consult/chief complaint: No chief complaint on file.   Subjective  HPI:  ***   ROS:  Review of Systems   Past Medical History: Past Medical History:  Diagnosis Date   Asthma      Past Surgical History: No past surgical history on file.   Family History: Family History  Problem Relation Age of Onset   Hypertension Father     Social History: Social History   Socioeconomic History   Marital status: Single    Spouse name: Not on file   Number of children: 3   Years of education: Not on file   Highest education level: Not on file  Occupational History   Not on file  Tobacco Use   Smoking status: Every Day    Packs/day: 0.50    Years: 10.00    Additional pack years: 0.00    Total pack years: 5.00    Types: Cigarettes   Smokeless tobacco: Never  Vaping Use   Vaping Use: Unknown  Substance and Sexual Activity   Alcohol use: Yes    Alcohol/week: 4.0 standard drinks of alcohol    Types: 4 Cans of beer per week    Comment: 4-5 cans a week   Drug use: Yes    Types: Marijuana    Comment: 3 times a week   Sexual activity: Yes    Birth control/protection: Condom  Other Topics Concern   Not on file  Social History Narrative   Lives with his girlfriend    Social Determinants of Health   Financial Resource Strain: High Risk (10/24/2022)   Overall Financial Resource Strain (CARDIA)    Difficulty of Paying Living Expenses: Very hard  Food Insecurity: Food Insecurity Present (10/24/2022)   Hunger Vital Sign    Worried About Running Out of Food in the Last Year: Sometimes true    Ran Out of Food in the Last Year: Never true  Transportation Needs: Unknown (03/28/2019)   PRAPARE - Transportation    Lack of Transportation (Medical): Patient declined    Lack of Transportation (Non-Medical): Patient declined  Physical  Activity: Unknown (03/28/2019)   Exercise Vital Sign    Days of Exercise per Week: Patient declined    Minutes of Exercise per Session: Patient declined  Stress: Stress Concern Present (03/28/2019)   [employer] of Occupational Health - Occupational Stress Questionnaire    Feeling of Stress : Very much  Social Connections: Unknown (03/28/2019)   Social Connection and Isolation Panel [NHANES]    Frequency of Communication with Friends and Family: Patient declined    Frequency of Social Gatherings with Friends and Family: Patient declined    Attends Religious Services: Patient declined    Database administrator or Organizations: Patient declined    Attends Banker Meetings: Patient declined    Marital Status: Patient declined    Allergies: Allergies  Allergen Reactions   Shellfish Allergy     Outpatient Meds: No current outpatient medications on file.   No current facility-administered medications for this visit.      ___________________________________________________________________ Objective   Exam:  There were no vitals taken for this visit. Wt Readings from Last 3 Encounters:  10/22/22 157 lb (71.2 kg)  07/21/22 154 lb 6.4 oz (70 kg)  08/12/20 169 lb 15.6 oz (77.1 kg)    General: ***  Eyes: sclera anicteric, no redness ENT:  oral mucosa moist without lesions, no cervical or supraclavicular lymphadenopathy CV: ***, no JVD, no peripheral edema Resp: clear to auscultation bilaterally, normal RR and effort noted GI: soft, *** tenderness, with active bowel sounds. No guarding or palpable organomegaly noted. Skin; warm and dry, no rash or jaundice noted Neuro: awake, alert and oriented x 3. Normal gross motor function and fluent speech  Labs:     Latest Ref Rng & Units 06/15/2022    1:50 AM 04/20/2022   11:00 PM 03/19/2022    8:55 PM  CBC  WBC 4.0 - 10.5 K/uL 4.4  4.6  3.9   Hemoglobin 13.0 - 17.0 g/dL 13.0  86.5  78.4   Hematocrit 39.0 - 52.0  % 45.7  36.7  42.9   Platelets 150 - 400 K/uL 210  200  183       Latest Ref Rng & Units 06/15/2022    1:50 AM 04/20/2022   11:00 PM 03/19/2022    8:55 PM  CMP  Glucose 70 - 99 mg/dL 696  83  77   BUN 6 - 20 mg/dL <5  <5  7   Creatinine 0.61 - 1.24 mg/dL 2.95  2.84  1.32   Sodium 135 - 145 mmol/L 139  140  145   Potassium 3.5 - 5.1 mmol/L 4.0  4.1  3.9   Chloride 98 - 111 mmol/L 103  105  113   CO2 22 - 32 mmol/L 23  21  23    Calcium 8.9 - 10.3 mg/dL 8.4  8.8  8.2   Total Protein 6.5 - 8.1 g/dL 5.9  5.3  4.9   Total Bilirubin 0.3 - 1.2 mg/dL <4.4  0.7  0.2   Alkaline Phos 38 - 126 U/L 57  57  53   AST 15 - 41 U/L 63  84  48   ALT 0 - 44 U/L 53  50  56      Radiologic Studies:  CLINICAL DATA:  Chest pain with breathing.  Evaluate for PE.   EXAM: CT ANGIOGRAPHY CHEST WITH CONTRAST   TECHNIQUE: Multidetector CT imaging of the chest was performed using the standard protocol during bolus administration of intravenous contrast. Multiplanar CT image reconstructions and MIPs were obtained to evaluate the vascular anatomy.   RADIATION DOSE REDUCTION: This exam was performed according to the departmental dose-optimization program which includes automated exposure control, adjustment of the mA and/or kV according to patient size and/or use of iterative reconstruction technique.   CONTRAST:  75mL OMNIPAQUE IOHEXOL 350 MG/ML SOLN   COMPARISON:  None Available.   FINDINGS: Cardiovascular: The heart size is normal. No substantial pericardial effusion. Bolus timing for the pulmonary arteries is suboptimal. Within this limitation there is no large central pulmonary embolus in the main or lobar pulmonary arteries. No discernible embolus in segmental branches although assessment of segmental and subsegmental pulmonary arteries may be unreliable.   Mediastinum/Nodes: No mediastinal lymphadenopathy. Calcified lymph nodes are seen in the mediastinum and left hilum. The esophagus  has normal imaging features. There is no axillary lymphadenopathy.   Lungs/Pleura: 2 mm right upper lobe nodule seen on 30/6/6. Calcified granuloma noted left lower lobe. No focal airspace consolidation. No pleural effusion. No findings to suggest pulmonary edema.   Upper Abdomen: 16 mm hypoattenuating lesion in the lateral right liver (122/5) is new since abdomen CT 08/12/2020 visualized upper abdomen otherwise unremarkable.   Musculoskeletal: No worrisome lytic or sclerotic osseous abnormality. Sternum and manubrium are intact. No evidence  for rib fracture   Review of the MIP images confirms the above findings.   IMPRESSION: 1. Bolus timing for the pulmonary arteries is suboptimal. Within this limitation there is no large central pulmonary embolus in the main or lobar pulmonary arteries. No discernible embolus in segmental branches although assessment of segmental and subsegmental pulmonary arteries may be unreliable. 2. 16 mm hypoattenuating lesion in the lateral right liver is new since abdomen CT 08/12/2020. This is indeterminate. MRI of the abdomen with and without contrast recommended to further evaluate. 3. 2 mm right upper lobe pulmonary nodule. No follow-up needed if patient is low-risk.This recommendation follows the consensus statement: Guidelines for Management of Incidental Pulmonary Nodules Detected on CT Images: From the Fleischner Society 2017; Radiology 2017; 284:228-243.     Electronically Signed   By: Kennith Center M.D.   On: 06/15/2022 10:55 _______________________  CLINICAL DATA:  Characterize incidental liver lesion identified by prior CT of the chest   EXAM: MRI ABDOMEN WITHOUT AND WITH CONTRAST   TECHNIQUE: Multiplanar multisequence MR imaging of the abdomen was performed both before and after the administration of intravenous contrast.   CONTRAST:  7mL GADAVIST GADOBUTROL 1 MMOL/ML IV SOLN   COMPARISON:  CT chest angiogram, 06/15/2022, CT  abdomen pelvis, 08/12/2020   FINDINGS: Lower chest: No acute abnormality.   Hepatobiliary: No solid liver abnormality is seen. No mass, signal abnormality, or abnormal contrast enhancement to correspond to suspected abnormality in the peripheral right lobe of the liver on prior CT. No gallstones, gallbladder wall thickening, or biliary dilatation.   Pancreas: Unremarkable. No pancreatic ductal dilatation or surrounding inflammatory changes.   Spleen: Normal in size without significant abnormality.   Adrenals/Urinary Tract: Adrenal glands are unremarkable. Kidneys are normal, without renal calculi, solid lesion, or hydronephrosis.   Stomach/Bowel: Stomach is within normal limits. No evidence of bowel wall thickening, distention, or inflammatory changes.   Vascular/Lymphatic: No significant vascular findings are present. No enlarged abdominal lymph nodes.   Other: No abdominal wall hernia or abnormality. No ascites.   Musculoskeletal: No acute or significant osseous findings.   IMPRESSION: No abnormality is seen to correspond to suspected abnormality in the peripheral right lobe of the liver on prior CT. No mass, signal abnormality, or abnormal contrast enhancement in this vicinity. In retrospective review of prior examination, this may have reflected streak artifact from adjacent ribs and overlying metallic lead.     Electronically Signed   By: Jearld Lesch M.D.   On: 08/18/2022 20:13    Assessment: No diagnosis found.  ***  Plan:  ***  Thank you for the courtesy of this consult.  Please call me with any questions or concerns.  Charlie Pitter III  CC: Referring provider noted above

## 2022-12-22 IMAGING — CR DG CHEST 2V
2 series · 2 of 2 positions shown · non-contrast
Comparison: 03/16/2017

CLINICAL DATA: Pt c/o cough and congestion x 1 day. Hx of asthma.
Pt is a current smoker.

EXAM:
CHEST - 2 VIEW

[chest pa]
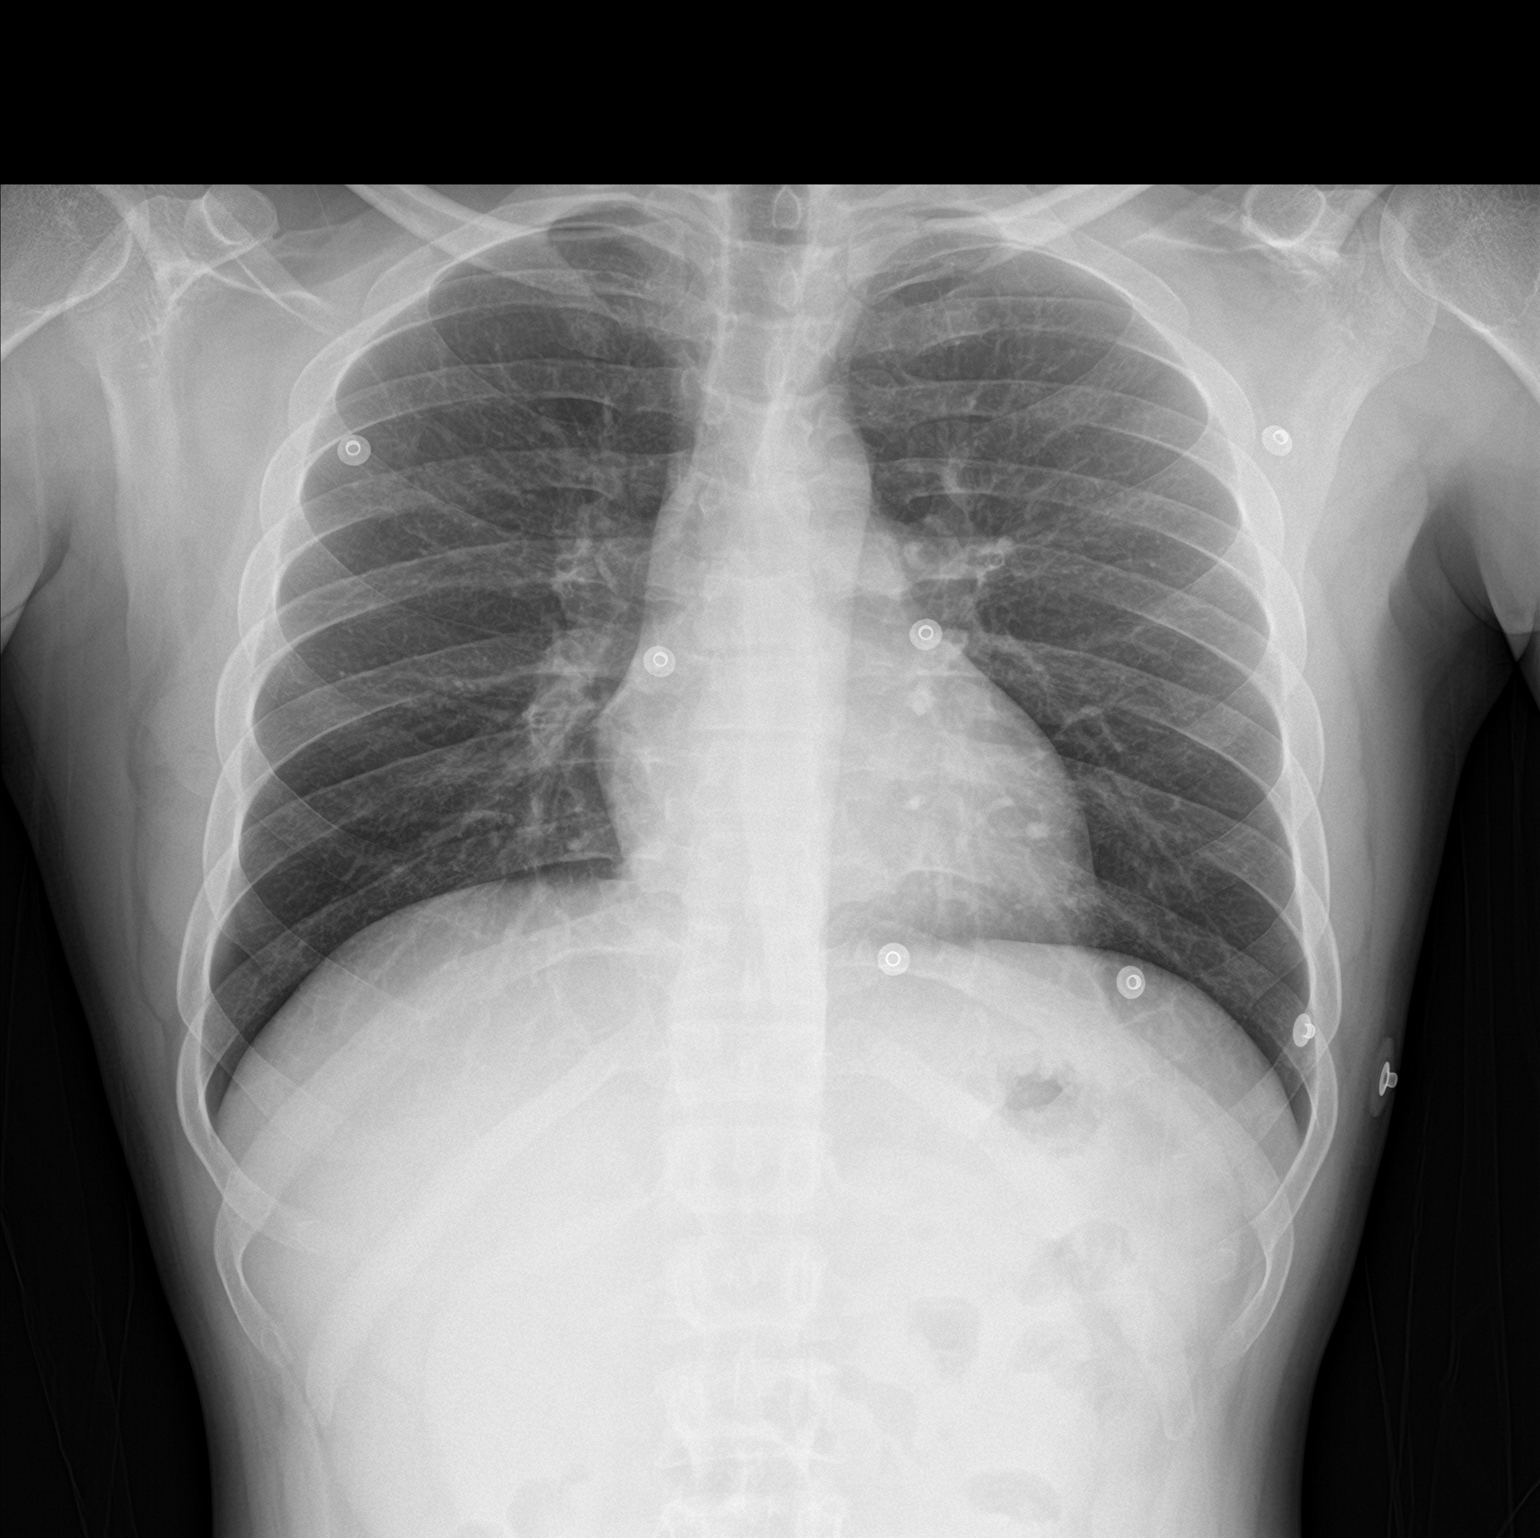

[chest lat]
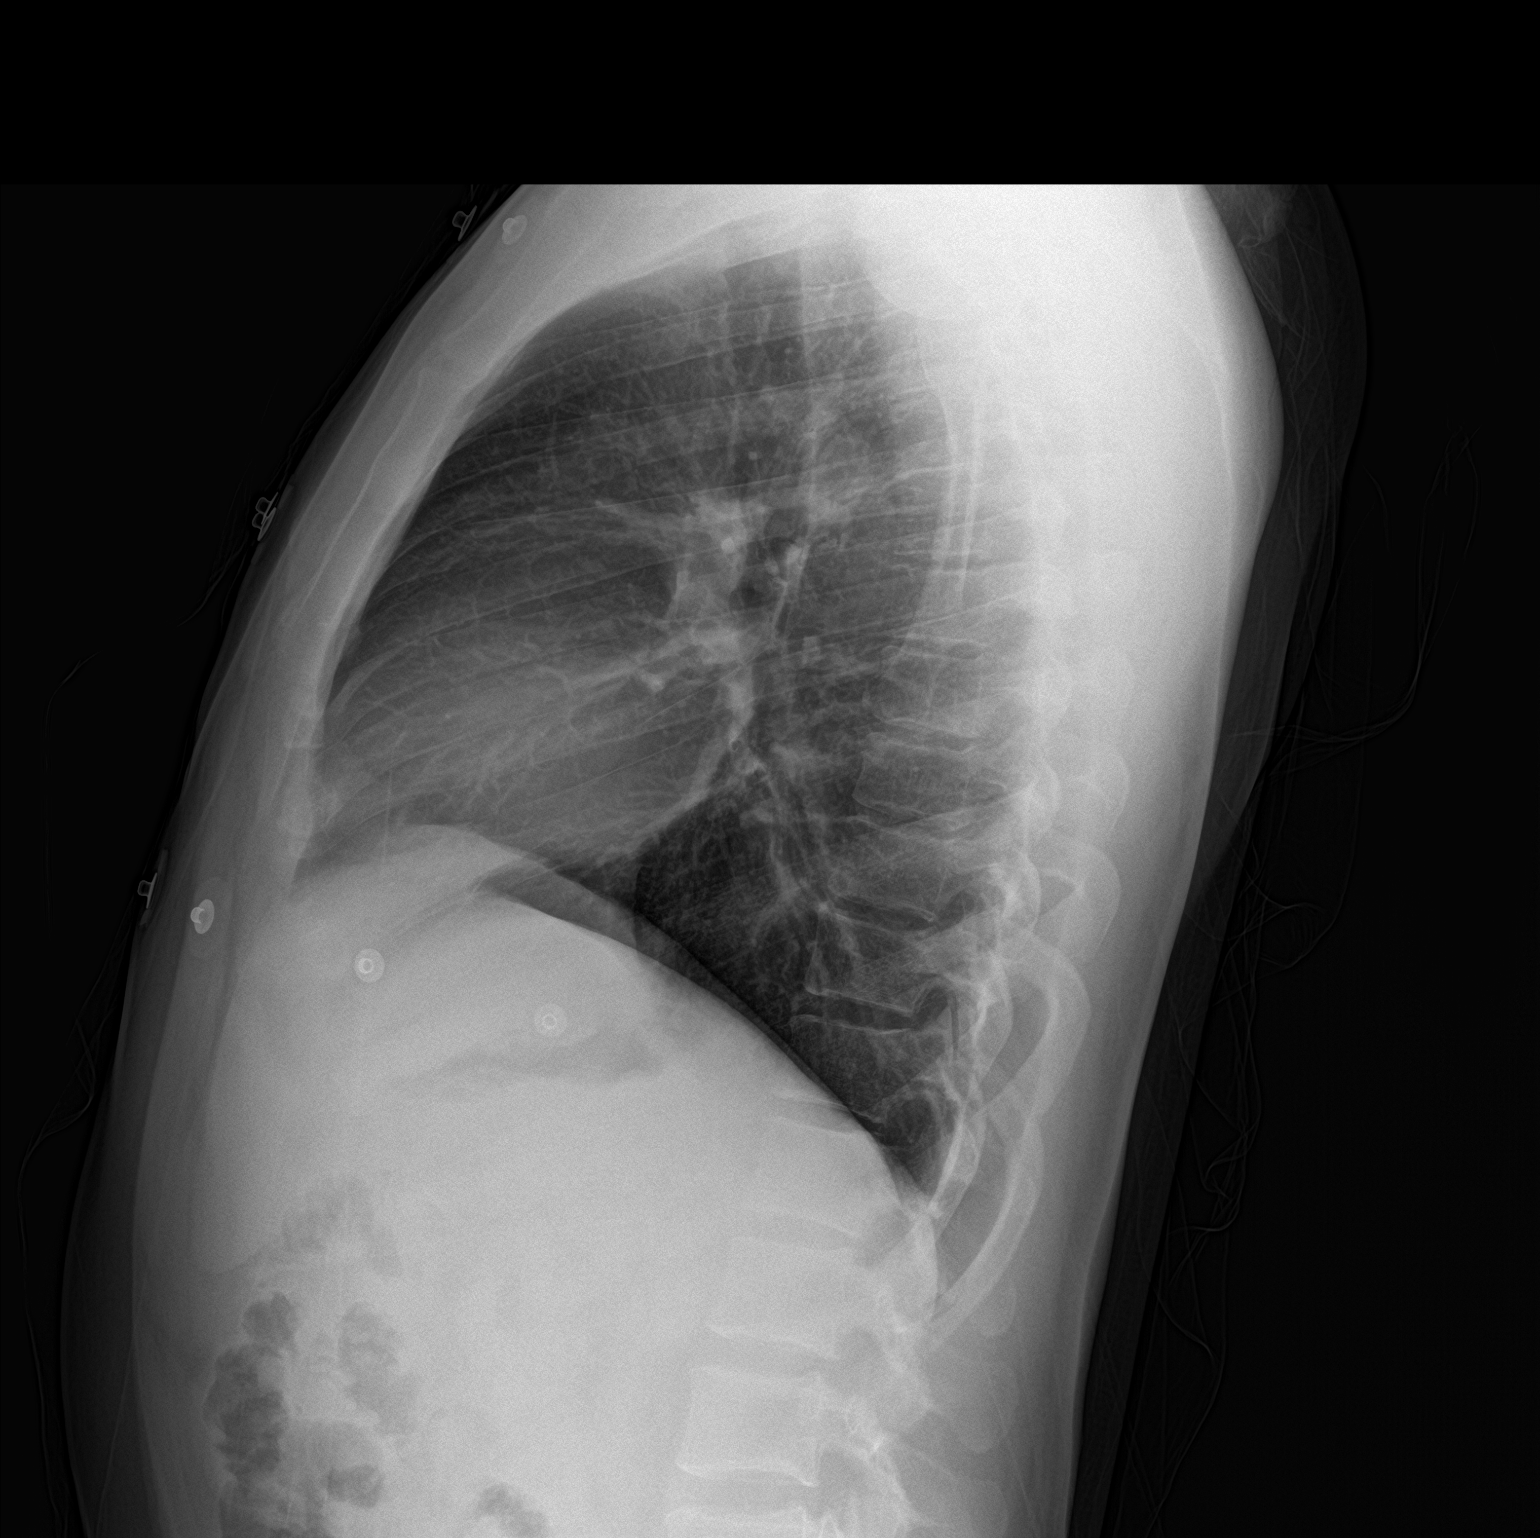

[2 of 2 positions shown; findings below may reference images not displayed]

FINDINGS: Chronic coarse perihilar interstitial prominence. No focal
infiltrate or overt edema.

Heart size and mediastinal contours are within normal limits.

No effusion.

Visualized bones unremarkable.
IMPRESSION: No acute cardiopulmonary disease.

## 2023-01-19 ENCOUNTER — Ambulatory Visit: Payer: Self-pay | Admitting: Nurse Practitioner

## 2023-02-15 ENCOUNTER — Ambulatory Visit (HOSPITAL_COMMUNITY)
Admission: EM | Admit: 2023-02-15 | Discharge: 2023-02-15 | Disposition: A | Discharge status: Home / Self Care | Payer: MEDICAID | Attending: Nurse Practitioner | Admitting: Nurse Practitioner

## 2023-02-15 DIAGNOSIS — F17290 Nicotine dependence, other tobacco product, uncomplicated: Secondary | ICD-10-CM | POA: Insufficient documentation

## 2023-02-15 DIAGNOSIS — F1990 Other psychoactive substance use, unspecified, uncomplicated: Secondary | ICD-10-CM

## 2023-02-15 DIAGNOSIS — F1010 Alcohol abuse, uncomplicated: Secondary | ICD-10-CM | POA: Insufficient documentation

## 2023-02-15 DIAGNOSIS — Z5900 Homelessness unspecified: Secondary | ICD-10-CM | POA: Insufficient documentation

## 2023-02-15 NOTE — ED Provider Notes (Signed)
[facility]al [facility] Urgent Care Medical Screening Exam  Patient Name: Howard Adams MRN: [id number] Date of Evaluation: 02/16/23 Chief Complaint:  I need a place to sleep Diagnosis:  Final diagnoses:  Alcohol abuse  Substance use disorder    History of Present illness: Howard Adams is a 37 y.o. male with a history of alcohol abuse and substance abuse presenting to [facility]. Patient that I need to sleep and I need a bed.  Patient reports that he has a drinking problem.  Patient states that he drinks 2-3 6 ounce beers daily and sometimes 40 ounces.  Patient reports that he smokes marijuana 2-3 times a week and he smokes Black and Milds cigars.   Nurse practitioner assessed patient face-to-face and reviewed his chart. Patient is alert oriented x 4, calm and cooperative, speech is clear and coherent, thought processes logical and goal-directed and mood is depressed with a flat affect. Patient states that he is homeless and that he wants to get back on his medications and to stop drinking. Patient reports that he not receiving outpatient mental health services or outpatient therapy.  Patient does not meet any criteria for inpatient treatment.  Patient can contract for safety.  Discussed with patient that he can receive outpatient medication management and therapy at [facility] outpatient clinic he can come in the morning at 7 AM and can be seen in their walk-in clinic.  Patient is in agreement with this plan.  Patient can be discharged with resources and follow up care in outpatient services for Medication Management and Individual Therapy    Flowsheet Row ED from 02/15/2023 in [facility] ED from 10/22/2022 in [facility] Emergency Department at [facility] ED from 06/15/2022 in [facility] Emergency Department at [facility]  C-SSRS RISK CATEGORY No Risk No Risk No Risk       Psychiatric Specialty Exam  Presentation  General Appearance:Casual  Eye  Contact:Fleeting  Speech:Clear and Coherent  Speech Volume:Normal  Handedness:Right   Mood and Affect  Mood: Irritable  Affect: Congruent   Thought Process  Thought Processes: Coherent  Descriptions of Associations:Intact  Orientation:Full (Time, Place and Person)  Thought Content:WDL  Diagnosis of Schizophrenia or Schizoaffective disorder in past: No   Hallucinations:None  Ideas of Reference:None  Suicidal Thoughts:No  Homicidal Thoughts:No   Sensorium  Memory: Immediate Fair; Recent Fair; Remote Fair  Judgment: Fair  Insight: Fair   Art therapist  Concentration: Fair  Attention Span: Fair  Recall: [employer] of Knowledge: Fair  Language: Fair   Psychomotor Activity  Psychomotor Activity: Normal   Assets  Assets: Manufacturing systems engineer; Desire for Improvement   Sleep  Sleep: Fair  Number of hours:  5   Physical Exam: Physical Exam Constitutional:      Appearance: Normal appearance.  HENT:     Head: Normocephalic and atraumatic.     Nose: Nose normal.  Eyes:     Pupils: Pupils are equal, round, and reactive to light.  Cardiovascular:     Rate and Rhythm: Normal rate.  Pulmonary:     Effort: Pulmonary effort is normal.  Abdominal:     General: Abdomen is flat.  Musculoskeletal:        General: Normal range of motion.     Cervical back: Normal range of motion.  Skin:    General: Skin is warm.  Neurological:     Mental Status: He is alert and oriented to person, place, and time.  Psychiatric:  Attention and Perception: Attention normal.        Speech: Speech normal.        Behavior: Behavior is cooperative.        Thought Content: Thought content normal.        Cognition and Memory: Cognition normal.        Judgment: Judgment is impulsive.    Review of Systems  Constitutional: Negative.   HENT: Negative.    Eyes: Negative.   Respiratory: Negative.    Cardiovascular: Negative.    Gastrointestinal: Negative.   Genitourinary: Negative.   Musculoskeletal: Negative.   Skin: Negative.   Neurological: Negative.   Endo/Heme/Allergies: Negative.   Psychiatric/Behavioral:  Positive for substance abuse.    Blood pressure (!) 147/95, pulse (!) 102, temperature 98 F (36.7 C), temperature source Oral, resp. rate 20, SpO2 99%. There is no height or weight on file to calculate BMI.  Musculoskeletal: Strength & Muscle Tone: within normal limits Gait & Station: normal Patient leans: N/A   [facility] MSE Discharge Disposition for Follow up and Recommendations: Based on my evaluation the patient does not appear to have an emergency medical condition and can be discharged with resources and follow up care in outpatient services for Medication Management and Individual Therapy   Jasper Riling, NP 02/16/2023, 7:17 AM

## 2023-02-15 NOTE — Progress Notes (Signed)
   02/15/23 2156  [facility] Triage Screening (Walk-ins at [facility] only)  How Did You Hear About [place]? Self  What Is the Reason for Your Visit/Call Today? Pt is a 37 yo male who presents to [facility] voluntarily who is seeking resources for depression and substance use. Pt reports that he drinks daily with the last use today. Pt reports that he drank  1- 40 oz and 2- 16 oz beers. Pt reports recently he has been struggling with feelings of sadness. Pt reports that he has feelings of aggression towards others throughout the day based on the situation but denies HI towards a specific person.  Pt denies SI and AVH.  How Long Has This Been Causing You Problems? > than 6 months  Have You Recently Had Any Thoughts About Hurting Yourself? No  Are You Planning to Commit Suicide/Harm Yourself At This time? No  Have you Recently Had Thoughts About Hurting Someone Karolee Ohs? Yes  How long ago did you have thoughts of harming others? Aggressive behavior ( "I may hit someone if they make me mad throughout the day")  Are You Planning To Harm Someone At This Time? No  Are you currently experiencing any auditory, visual or other hallucinations? No  Have You Used Any Alcohol or Drugs in the Past 24 Hours? Yes  How long ago did you use Drugs or Alcohol? Today  What Did You Use and How Much? 1 -40 oz and 2 - 16 oz of beers  Do you have any current medical co-morbidities that require immediate attention? No  Clinician description of patient physical appearance/behavior: Pt is was cooperative and tearful  What Do You Feel Would Help You the Most Today? Alcohol or Drug Use Treatment;Medication(s);Stress Management  If access to [facility] was not available, would you have sought care in the Emergency Department? No  Determination of Need Routine (7 days)  Options For Referral Outpatient Therapy    Flowsheet Row ED from 02/15/2023 in [facility] ED from 10/22/2022 in [facility] Emergency Department at  [facility] ED from 06/15/2022 in [facility] Emergency Department at [facility]  C-SSRS RISK CATEGORY No Risk No Risk No Risk

## 2023-02-15 NOTE — Discharge Instructions (Signed)
  Discharge recommendations:  Patient is to take medications as prescribed. Please see information for follow-up appointment with psychiatry and therapy. Please follow up with your primary care provider for all medical related needs.  Follow up at [facility] outpatient clinic during walk in hours for ongoing medication management services.  Therapy: We recommend that patient participate in individual therapy to address mental health concerns.  Medications: The patient or guardian is to contact a medical professional and/or outpatient provider to address any new side effects that develop. The patient or guardian should update outpatient providers of any new medications and/or medication changes.   Atypical antipsychotics: If you are prescribed an atypical antipsychotic, it is recommended that your height, weight, BMI, blood pressure, fasting lipid panel, and fasting blood sugar be monitored by your outpatient providers.  Safety:  The patient should abstain from use of illicit substances/drugs and abuse of any medications. If symptoms worsen or do not continue to improve or if the patient becomes actively suicidal or homicidal then it is recommended that the patient return to the closest hospital emergency department, the [facility], or call 911 for further evaluation and treatment. National Suicide Prevention Lifeline 1-800-SUICIDE or (240)540-1353.  About 988 988 offers 24/7 access to trained crisis counselors who can help people experiencing mental health-related distress. People can call or text 988 or chat 988lifeline.org for themselves or if they are worried about a loved one who may need crisis support.  Crisis Mobile: [facility]:                     737-299-2917 (for crisis response 24 hours a day) [facility] Hotline:                                            469-534-4732

## 2023-02-16 ENCOUNTER — Ambulatory Visit (HOSPITAL_COMMUNITY)
Admission: EM | Admit: 2023-02-16 | Discharge: 2023-02-16 | Disposition: A | Discharge status: Home / Self Care | Payer: MEDICAID

## 2023-02-16 DIAGNOSIS — F1010 Alcohol abuse, uncomplicated: Secondary | ICD-10-CM

## 2023-02-16 NOTE — Discharge Instructions (Signed)

## 2023-02-16 NOTE — ED Provider Notes (Signed)
Behavioral Health Urgent Care Medical Screening Exam  Patient Name: Howard Adams MRN: [id number] Date of Evaluation: 02/16/23 Chief Complaint:  substance abuse Diagnosis:  Final diagnoses:  Alcohol abuse    History of Present illness: Howard Adams is a 37 y.o. male with a history of alcohol abuse and polysubstance abuse presenting voluntarily and unaccompanied to [facility]. Patient stated that he wanted a sandwich to eat. Patient given juice and a sandwich.   Nurse practitioner assessed patient face-to-face and reviewed his chart.  Patient is alert oriented x 4, calm and cooperative, speech is clear and coherent, thought processes logical and goal-directed and mood is depressed with a flat affect. Patient reports that he wants to get back on his medications and stop drinking. Patient presented to [facility] a few hours ago with similar complaints.    Patient can be discharged with resources and follow up care in outpatient services for Medication Management and Individual Therapy. Patient denies any SI/HI/AVH and does not appear to be responding to any internal or external stimuli. Advised patient again to follow up with outpatient mental services in [facility]. Patient is in agreement with plan to follow up with [facility] outpatient clinic to establish mental health services.    Flowsheet Row ED from 02/15/2023 in [facility] ED from 10/22/2022 in [facility] Emergency Department at [facility] ED from 06/15/2022 in [facility] Emergency Department at [facility]  C-SSRS RISK CATEGORY No Risk No Risk No Risk       Psychiatric Specialty Exam  Presentation  General Appearance:Casual  Eye Contact:Fleeting  Speech:Clear and Coherent  Speech Volume:Normal  Handedness:Right   Mood and Affect  Mood: Irritable  Affect: Congruent   Thought Process  Thought Processes: Coherent  Descriptions of Associations:Intact  Orientation:Full (Time, Place  and Person)  Thought Content:WDL  Diagnosis of Schizophrenia or Schizoaffective disorder in past: No   Hallucinations:None  Ideas of Reference:None  Suicidal Thoughts:No  Homicidal Thoughts:No   Sensorium  Memory: Immediate Fair; Recent Fair; Remote Fair  Judgment: Fair  Insight: Fair   Art therapist  Concentration: Fair  Attention Span: Fair  Recall: [employer] of Knowledge: Fair  Language: Fair   Psychomotor Activity  Psychomotor Activity: Normal   Assets  Assets: Manufacturing systems engineer; Desire for Improvement   Sleep  Sleep: Fair  Number of hours:  5   Physical Exam: Physical Exam Constitutional:      Appearance: Normal appearance.  HENT:     Head: Normocephalic and atraumatic.     Nose: Nose normal.  Eyes:     Pupils: Pupils are equal, round, and reactive to light.  Cardiovascular:     Rate and Rhythm: Normal rate.  Pulmonary:     Effort: Pulmonary effort is normal.  Abdominal:     General: Abdomen is flat.  Musculoskeletal:        General: Normal range of motion.     Cervical back: Normal range of motion.  Skin:    General: Skin is warm.  Neurological:     Mental Status: He is alert and oriented to person, place, and time.    Review of Systems  Constitutional: Negative.   HENT: Negative.    Eyes: Negative.   Respiratory: Negative.    Cardiovascular: Negative.   Gastrointestinal: Negative.   Genitourinary: Negative.   Musculoskeletal: Negative.   Skin: Negative.   Neurological: Negative.   Endo/Heme/Allergies: Negative.   Psychiatric/Behavioral:  Positive for substance abuse.  Blood pressure 130/80, pulse 89, temperature 98.1 F (36.7 C), resp. rate 18, SpO2 98%. There is no height or weight on file to calculate BMI.  Musculoskeletal: Strength & Muscle Tone: within normal limits Gait & Station: normal Patient leans: N/A   [facility] MSE Discharge Disposition for Follow up and Recommendations: Based on my  evaluation the patient does not appear to have an emergency medical condition and can be discharged with resources and follow up care in outpatient services for Medication Management and Individual Therapy   Jasper Riling, NP 02/16/2023, 3:45 AM

## 2023-02-16 NOTE — Progress Notes (Signed)
   02/16/23 0811  [facility] Triage Screening (Walk-ins at [facility], [place] only)  How Did You Hear About [place]? Self  What Is the Reason for Your Visit/Call Today? Pt is a 37 yo male who presents to [facility] voluntarily who is seeking an alcohol use treatment. Pt was seen and discharged lastnight at this facility for a similar complaint. Pt reports daily alcohol use and occasional marijuana use. Last use was 11pm lastnight, he reports consuming three 16oz beers, and a 40oz beer throughout the day yesterday. Pt reports he is homeless. Pt states "I have nowhere to go because nobody wants to let me stay with them because of my drinking", "I need to get some help".Pt denies SI/HI and AVH.  How Long Has This Been Causing You Problems? > than 6 months  Have You Recently Had Any Thoughts About Hurting Yourself? No  Are You Planning to Commit Suicide/Harm Yourself At This time? No  Have you Recently Had Thoughts About Hurting Someone Karolee Ohs? No  Are You Planning To Harm Someone At This Time? No  Are you currently experiencing any auditory, visual or other hallucinations? No  Have You Used Any Alcohol or Drugs in the Past 24 Hours? Yes  Do you have any current medical co-morbidities that require immediate attention? No  Clinician description of patient physical appearance/behavior: guarded, agitated  What Do You Feel Would Help You the Most Today? Alcohol or Drug Use Treatment  If access to [facility] was not available, would you have sought care in the Emergency Department? No  Determination of Need Routine (7 days)  Options For Referral Facility-Based Crisis

## 2023-04-27 ENCOUNTER — Other Ambulatory Visit: Payer: Self-pay

## 2023-04-27 ENCOUNTER — Emergency Department (HOSPITAL_COMMUNITY)
Admission: EM | Admit: 2023-04-27 | Discharge: 2023-04-27 | Disposition: A | Discharge status: Home / Self Care | Payer: MEDICAID | Attending: Emergency Medicine | Admitting: Emergency Medicine

## 2023-04-27 ENCOUNTER — Encounter (HOSPITAL_COMMUNITY): Payer: Self-pay

## 2023-04-27 DIAGNOSIS — S61217A Laceration without foreign body of left little finger without damage to nail, initial encounter: Secondary | ICD-10-CM | POA: Insufficient documentation

## 2023-04-27 DIAGNOSIS — W260XXA Contact with knife, initial encounter: Secondary | ICD-10-CM | POA: Diagnosis not present

## 2023-04-27 DIAGNOSIS — Z23 Encounter for immunization: Secondary | ICD-10-CM | POA: Diagnosis not present

## 2023-04-27 MED ORDER — LIDOCAINE HCL (PF) 1 % IJ SOLN
5.0000 mL | Freq: Once | INTRAMUSCULAR | Status: AC
  Administered 2023-04-27: 5 mL via INTRADERMAL
  Filled 2023-04-27: qty 5

## 2023-04-27 MED ORDER — TETANUS-DIPHTH-ACELL PERTUSSIS 5-2.5-18.5 LF-MCG/0.5 IM SUSY
0.5000 mL | PREFILLED_SYRINGE | Freq: Once | INTRAMUSCULAR | Status: AC
  Administered 2023-04-27: 0.5 mL via INTRAMUSCULAR
  Filled 2023-04-27: qty 0.5

## 2023-04-27 MED ORDER — ACETAMINOPHEN 500 MG PO TABS
1000.0000 mg | ORAL_TABLET | Freq: Once | ORAL | Status: AC
  Administered 2023-04-27: 1000 mg via ORAL
  Filled 2023-04-27: qty 2

## 2023-04-27 NOTE — ED Notes (Signed)
I applied a pressure dressing to pt's lac

## 2023-04-27 NOTE — ED Notes (Signed)
Provider at bedside doing laceration repair at this time to 5th finger, left hand.

## 2023-04-27 NOTE — ED Provider Notes (Signed)
[place] EMERGENCY DEPARTMENT AT [facility] Provider Note   CSN: [id number] Arrival date & time: 04/27/23  1255     History  Chief Complaint  Patient presents with   Laceration    Howard Adams is a 37 y.o. male.  37 year old male presents here for concerns of laceration to the tip of his left fifth finger sustained this morning.  Patient was trying to cut a wire with a knife, when the knife slipped.  He is unsure when his last Tdap was.  Not currently on any blood thinners.  Patient denies any sensory deficits to his finger.  Does endorse pain localized to the area of the wound.  The history is provided by the patient.       Home Medications Prior to Admission medications   Not on File      Allergies    Shellfish allergy    Review of Systems   As noted in HPI  Physical Exam Updated Vital Signs BP (!) 147/94 (BP Location: Right Arm)   Pulse 94   Temp 98.7 F (37.1 C) (Oral)   Resp 16   Ht 5\' 6"  (1.676 m)   Wt 71.2 kg   SpO2 99%   BMI 25.34 kg/m  Physical Exam Vitals reviewed.  Constitutional:      General: He is not in acute distress.    Appearance: Normal appearance. He is normal weight. He is not ill-appearing, toxic-appearing or diaphoretic.  Cardiovascular:     Rate and Rhythm: Normal rate and regular rhythm.     Heart sounds: Normal heart sounds. No murmur heard.    No friction rub. No gallop.  Pulmonary:     Effort: Pulmonary effort is normal. No respiratory distress.     Breath sounds: Normal breath sounds. No wheezing, rhonchi or rales.  Musculoskeletal:     Comments: Full active range of motion in flexion and extension of the MCP, PIP, DIP of the left fifth finger.  Skin:    General: Skin is warm and dry.     Findings: Laceration (1 cm linear laceration to the distal, palmar side of the left fifth digit) present.  Neurological:     Mental Status: He is alert.     Comments: Sensation intact to the distal tip of the left fifth  finger     ED Results / Procedures / Treatments   Labs (all labs ordered are listed, but only abnormal results are displayed) Labs Reviewed - No data to display  EKG None  Radiology No results found.  Procedures .Laceration Repair  Date/Time: 04/27/2023 2:51 PM  Performed by: Rolla Flatten, MD Authorized by: Wynetta Fines, MD   Consent:    Consent obtained:  Verbal   Consent given by:  Patient   Risks, benefits, and alternatives were discussed: yes     Risks discussed:  Infection, pain, retained foreign body, tendon damage, vascular damage, poor wound healing, poor cosmetic result, nerve damage and need for additional repair   Alternatives discussed:  No treatment Universal protocol:    Procedure explained and questions answered to patient or proxy's satisfaction: yes     Patient identity confirmed:  Verbally with patient Anesthesia:    Anesthesia method:  Local infiltration   Local anesthetic:  Lidocaine 1% w/o epi Laceration details:    Location:  Finger   Finger location:  L small finger   Length (cm):  1   Depth (mm):  2 Pre-procedure details:    Preparation:  Patient was prepped and draped in usual sterile fashion Exploration:    Hemostasis achieved with:  Direct pressure   Wound exploration: wound explored through full range of motion and entire depth of wound visualized     Wound extent: areolar tissue not violated, fascia not violated, no foreign body, no signs of injury, no nerve damage, no tendon damage, no underlying fracture and no vascular damage     Contaminated: no   Treatment:    Area cleansed with:  Saline   Amount of cleaning:  Standard   Irrigation solution:  Sterile saline   Irrigation volume:  200   Irrigation method:  Pressure wash   Visualized foreign bodies/material removed: no     Debridement:  None Skin repair:    Repair method:  Sutures   Suture size:  5-0   Wound skin closure material used: Ethilon.   Suture technique:  Simple  interrupted   Number of sutures:  4 Approximation:    Approximation:  Close Repair type:    Repair type:  Simple Post-procedure details:    Dressing:  Antibiotic ointment and sterile dressing   Procedure completion:  Tolerated well, no immediate complications     Medications Ordered in ED Medications  lidocaine (PF) (XYLOCAINE) 1 % injection 5 mL (5 mLs Intradermal Given 04/27/23 1455)  Tdap (BOOSTRIX) injection 0.5 mL (0.5 mLs Intramuscular Given 04/27/23 1455)  acetaminophen (TYLENOL) tablet 1,000 mg (1,000 mg Oral Given 04/27/23 1455)    ED Course/ Medical Decision Making/ A&P                                 Medical Decision Making Risk OTC drugs. Prescription drug management.   37 year old male presents here for laceration to the left fifth finger.  Vitals reassuring.  On exam, 1 cm superficial laceration.  Wound irrigated and thoroughly explored throughout full range of motion.  No nerve, vascular, ligament, tendon involvement.  Sensation is intact.  Patient has full range of motion of all joints of the left fifth finger.  He is unsure when his last Tdap was.  Tdap updated today.  Patient given Tylenol for analgesia.  Bedside laceration repair performed.  Please see procedure note above.  Patient tolerated well.  No indications for hospital admission at this time.  Patient discharged in stable condition with recommendations to follow-up with PCP in 5 to 7 days for suture removal.  Patient's presentation is most consistent with acute, uncomplicated illness.         Final Clinical Impression(s) / ED Diagnoses Final diagnoses:  Laceration of left little finger without foreign body without damage to nail, initial encounter    Rx / DC Orders ED Discharge Orders     None         Rolla Flatten, MD 04/27/23 1635    Wynetta Fines, MD 04/28/23 [phone]

## 2023-04-27 NOTE — Discharge Instructions (Signed)
Please keep the laceration clean and dry for the next 24 hours.  You can then wash your hands normally.  Do not scrub at the stitches.  You can let the soap and water run over them.  The stitches need to be removed by your primary care doctor or an urgent care in the next 5 to 7 days.  Please watch out for any severe swelling, redness, pus draining from the wound, or fevers.  If you develop any of the symptoms, please come back to the emergency department for reevaluation.  You may take over-the-counter Tylenol and ibuprofen as needed for pain.

## 2023-04-27 NOTE — ED Triage Notes (Signed)
Pt was cutting wire to get copper out of it and the knife slipped and lacerated his 5th finger on left hand at 0945 today. Pt has approx 1in lac to the anterior top of left 5th finger. The subcutaneous tissue is showing.

## 2023-11-27 ENCOUNTER — Other Ambulatory Visit (HOSPITAL_COMMUNITY)
Admission: EM | Admit: 2023-11-27 | Discharge: 2023-12-01 | Disposition: A | Discharge status: Home Health | Payer: MEDICAID | Attending: Psychiatry | Admitting: Psychiatry

## 2023-11-27 ENCOUNTER — Ambulatory Visit (HOSPITAL_COMMUNITY)
Admission: EM | Admit: 2023-11-27 | Discharge: 2023-11-27 | Disposition: A | Discharge status: Other Acute Inpatient Hospital | Payer: MEDICAID | Attending: Behavioral Health | Admitting: Behavioral Health

## 2023-11-27 DIAGNOSIS — F1010 Alcohol abuse, uncomplicated: Secondary | ICD-10-CM

## 2023-11-27 DIAGNOSIS — Z79899 Other long term (current) drug therapy: Secondary | ICD-10-CM | POA: Insufficient documentation

## 2023-11-27 DIAGNOSIS — F1014 Alcohol abuse with alcohol-induced mood disorder: Secondary | ICD-10-CM | POA: Insufficient documentation

## 2023-11-27 DIAGNOSIS — F32A Depression, unspecified: Secondary | ICD-10-CM | POA: Diagnosis not present

## 2023-11-27 DIAGNOSIS — F17200 Nicotine dependence, unspecified, uncomplicated: Secondary | ICD-10-CM | POA: Insufficient documentation

## 2023-11-27 DIAGNOSIS — F1094 Alcohol use, unspecified with alcohol-induced mood disorder: Secondary | ICD-10-CM | POA: Diagnosis present

## 2023-11-27 DIAGNOSIS — F1490 Cocaine use, unspecified, uncomplicated: Secondary | ICD-10-CM

## 2023-11-27 DIAGNOSIS — Z5900 Homelessness unspecified: Secondary | ICD-10-CM | POA: Insufficient documentation

## 2023-11-27 DIAGNOSIS — F10230 Alcohol dependence with withdrawal, uncomplicated: Secondary | ICD-10-CM | POA: Insufficient documentation

## 2023-11-27 DIAGNOSIS — F1220 Cannabis dependence, uncomplicated: Secondary | ICD-10-CM | POA: Diagnosis not present

## 2023-11-27 DIAGNOSIS — J45909 Unspecified asthma, uncomplicated: Secondary | ICD-10-CM | POA: Insufficient documentation

## 2023-11-27 DIAGNOSIS — F1994 Other psychoactive substance use, unspecified with psychoactive substance-induced mood disorder: Secondary | ICD-10-CM

## 2023-11-27 DIAGNOSIS — F1494 Cocaine use, unspecified with cocaine-induced mood disorder: Secondary | ICD-10-CM | POA: Insufficient documentation

## 2023-11-27 LAB — CBC WITH DIFFERENTIAL/PLATELET
Abs Immature Granulocytes: 0.02 K/uL (ref 0.00–0.07)
Basophils Absolute: 0 K/uL (ref 0.0–0.1)
Basophils Relative: 1 %
Eosinophils Absolute: 0 K/uL (ref 0.0–0.5)
Eosinophils Relative: 1 %
HCT: 39.5 % (ref 39.0–52.0)
Hemoglobin: 13.8 g/dL (ref 13.0–17.0)
Immature Granulocytes: 1 %
Lymphocytes Relative: 30 %
Lymphs Abs: 1.2 K/uL (ref 0.7–4.0)
MCH: 32.2 pg (ref 26.0–34.0)
MCHC: 34.9 g/dL (ref 30.0–36.0)
MCV: 92.3 fL (ref 80.0–100.0)
Monocytes Absolute: 0.3 K/uL (ref 0.1–1.0)
Monocytes Relative: 9 %
Neutro Abs: 2.3 K/uL (ref 1.7–7.7)
Neutrophils Relative %: 58 %
Platelets: 169 K/uL (ref 150–400)
RBC: 4.28 MIL/uL (ref 4.22–5.81)
RDW: 13.6 % (ref 11.5–15.5)
WBC: 3.9 K/uL — ABNORMAL LOW (ref 4.0–10.5)
nRBC: 0 % (ref 0.0–0.2)

## 2023-11-27 LAB — COMPREHENSIVE METABOLIC PANEL WITH GFR
ALT: 28 U/L (ref 0–44)
AST: 29 U/L (ref 15–41)
Albumin: 3.7 g/dL (ref 3.5–5.0)
Alkaline Phosphatase: 74 U/L (ref 38–126)
Anion gap: 12 (ref 5–15)
BUN: 5 mg/dL — ABNORMAL LOW (ref 6–20)
CO2: 21 mmol/L — ABNORMAL LOW (ref 22–32)
Calcium: 9.2 mg/dL (ref 8.9–10.3)
Chloride: 109 mmol/L (ref 98–111)
Creatinine, Ser: 0.81 mg/dL (ref 0.61–1.24)
GFR, Estimated: >60 mL/min (ref >60)
Glucose, Bld: 79 mg/dL (ref 70–99)
Potassium: 4.1 mmol/L (ref 3.5–5.1)
Sodium: 142 mmol/L (ref 135–145)
Total Bilirubin: 0.4 mg/dL (ref 0.0–1.2)
Total Protein: 7 g/dL (ref 6.5–8.1)

## 2023-11-27 LAB — POCT URINE DRUG SCREEN - MANUAL ENTRY (I-SCREEN)
POC Amphetamine UR: NOT DETECTED
POC Buprenorphine (BUP): NOT DETECTED
POC Cocaine UR: POSITIVE — AB
POC Marijuana UR: POSITIVE — AB
POC Methadone UR: NOT DETECTED
POC Methamphetamine UR: NOT DETECTED
POC Morphine: NOT DETECTED
POC Oxazepam (BZO): NOT DETECTED
POC Oxycodone UR: NOT DETECTED
POC Secobarbital (BAR): NOT DETECTED

## 2023-11-27 LAB — HEMOGLOBIN A1C
Hgb A1c MFr Bld: 5.2 % (ref 4.8–5.6)
Mean Plasma Glucose: 102.54 mg/dL

## 2023-11-27 LAB — ETHANOL: Alcohol, Ethyl (B): 265 mg/dL — ABNORMAL HIGH (ref <15)

## 2023-11-27 LAB — LIPID PANEL
Cholesterol: 160 mg/dL (ref 0–200)
HDL: 73 mg/dL (ref >40)
LDL Cholesterol: 67 mg/dL (ref 0–99)
Total CHOL/HDL Ratio: 2.2 ratio
Triglycerides: 99 mg/dL (ref <150)
VLDL: 20 mg/dL (ref 0–40)

## 2023-11-27 LAB — TSH: TSH: 0.482 u[IU]/mL (ref 0.350–4.500)

## 2023-11-27 LAB — HIV ANTIBODY (ROUTINE TESTING W REFLEX): HIV Screen 4th Generation wRfx: NONREACTIVE

## 2023-11-27 MED ORDER — MAGNESIUM HYDROXIDE 400 MG/5ML PO SUSP: 30.0000 mL | Freq: Every day | ORAL | Status: DC | PRN

## 2023-11-27 MED ORDER — HYDROXYZINE HCL 25 MG PO TABS
25.0000 mg | ORAL_TABLET | Freq: Three times a day (TID) | ORAL | Status: DC | PRN
  Administered 2023-11-28: 25 mg via ORAL
  Filled 2023-11-27: qty 1

## 2023-11-27 MED ORDER — HALOPERIDOL LACTATE 5 MG/ML IJ SOLN: 10.0000 mg | Freq: Three times a day (TID) | INTRAMUSCULAR | Status: DC | PRN

## 2023-11-27 MED ORDER — LORAZEPAM 1 MG PO TABS
1.0000 mg | ORAL_TABLET | Freq: Three times a day (TID) | ORAL | Status: AC
  Administered 2023-11-29 (×3): 1 mg via ORAL
  Filled 2023-11-27 (×3): qty 1

## 2023-11-27 MED ORDER — LOPERAMIDE HCL 2 MG PO CAPS: 2.0000 mg | ORAL_CAPSULE | ORAL | Status: AC | PRN

## 2023-11-27 MED ORDER — LORAZEPAM 2 MG/ML IJ SOLN: 2.0000 mg | Freq: Three times a day (TID) | INTRAMUSCULAR | Status: DC | PRN

## 2023-11-27 MED ORDER — HALOPERIDOL LACTATE 5 MG/ML IJ SOLN: 5.0000 mg | Freq: Three times a day (TID) | INTRAMUSCULAR | Status: DC | PRN

## 2023-11-27 MED ORDER — ONDANSETRON 4 MG PO TBDP: 4.0000 mg | ORAL_TABLET | Freq: Four times a day (QID) | ORAL | Status: AC | PRN

## 2023-11-27 MED ORDER — LORAZEPAM 1 MG PO TABS
1.0000 mg | ORAL_TABLET | Freq: Four times a day (QID) | ORAL | Status: AC
  Administered 2023-11-27 – 2023-11-28 (×7): 1 mg via ORAL
  Filled 2023-11-27 (×7): qty 1

## 2023-11-27 MED ORDER — ADULT MULTIVITAMIN W/MINERALS CH
1.0000 | ORAL_TABLET | Freq: Every day | ORAL | Status: DC
  Administered 2023-11-27 – 2023-12-01 (×5): 1 via ORAL
  Filled 2023-11-27 (×5): qty 1

## 2023-11-27 MED ORDER — DIPHENHYDRAMINE HCL 50 MG/ML IJ SOLN: 50.0000 mg | Freq: Three times a day (TID) | INTRAMUSCULAR | Status: DC | PRN

## 2023-11-27 MED ORDER — ALUM & MAG HYDROXIDE-SIMETH 200-200-20 MG/5ML PO SUSP: 30.0000 mL | ORAL | Status: DC | PRN

## 2023-11-27 MED ORDER — THIAMINE MONONITRATE 100 MG PO TABS
100.0000 mg | ORAL_TABLET | Freq: Every day | ORAL | Status: DC
  Administered 2023-11-28 – 2023-12-01 (×4): 100 mg via ORAL
  Filled 2023-11-27 (×4): qty 1

## 2023-11-27 MED ORDER — TRAZODONE HCL 50 MG PO TABS
50.0000 mg | ORAL_TABLET | Freq: Every evening | ORAL | Status: DC | PRN
  Administered 2023-11-30: 50 mg via ORAL
  Filled 2023-11-27: qty 1

## 2023-11-27 MED ORDER — DIPHENHYDRAMINE HCL 50 MG PO CAPS: 50.0000 mg | ORAL_CAPSULE | Freq: Three times a day (TID) | ORAL | Status: DC | PRN

## 2023-11-27 MED ORDER — HALOPERIDOL 5 MG PO TABS: 5.0000 mg | ORAL_TABLET | Freq: Three times a day (TID) | ORAL | Status: DC | PRN

## 2023-11-27 MED ORDER — LORAZEPAM 1 MG PO TABS
1.0000 mg | ORAL_TABLET | Freq: Two times a day (BID) | ORAL | Status: AC
  Administered 2023-11-30 (×2): 1 mg via ORAL
  Filled 2023-11-27 (×2): qty 1

## 2023-11-27 MED ORDER — LORAZEPAM 1 MG PO TABS: 1.0000 mg | ORAL_TABLET | Freq: Four times a day (QID) | ORAL | Status: AC | PRN

## 2023-11-27 MED ORDER — TRAZODONE HCL 50 MG PO TABS: 50.0000 mg | ORAL_TABLET | Freq: Every evening | ORAL | Status: DC | PRN

## 2023-11-27 MED ORDER — LORAZEPAM 1 MG PO TABS
1.0000 mg | ORAL_TABLET | Freq: Every day | ORAL | Status: AC
  Administered 2023-12-01: 1 mg via ORAL
  Filled 2023-11-27: qty 1

## 2023-11-27 MED ORDER — ACETAMINOPHEN 325 MG PO TABS: 650.0000 mg | ORAL_TABLET | Freq: Four times a day (QID) | ORAL | Status: DC | PRN

## 2023-11-27 MED ORDER — THIAMINE HCL 100 MG/ML IJ SOLN: 100.0000 mg | Freq: Once | INTRAMUSCULAR | Status: DC

## 2023-11-27 MED ORDER — HYDROXYZINE HCL 25 MG PO TABS: 25.0000 mg | ORAL_TABLET | Freq: Three times a day (TID) | ORAL | Status: DC | PRN

## 2023-11-27 NOTE — ED Provider Notes (Signed)
 [facility]al [facility] Urgent Care Medical Screening Exam  Patient Name: Howard Adams MRN: [id number] Date of Evaluation: 11/27/23 Chief Complaint:  alcohol  problem Diagnosis:  Final diagnoses:  Alcohol  abuse  Cocaine use  Substance induced mood disorder (HCC)    History of Present illness: Howard Adams is a 38 y.o. 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 Patient states that he has been drinking alcohol   since he was 38 years old. He states that for the past 6 months he has been drinking mostly beer, on average 120 ounces per day. He states that he last consumed alcohol  2 hours ago, 32 ounces of beer. He is not experiencing alcohol  withdrawal symptoms at this time. He states that in the past when withdrawing from alcohol  he usually experiences shakes and sweats and denies a history of alcohol  withdrawal seizures or delirium tremens. He reports smoking marijuana since he was 38 years old, on average he smokes every other day and states that one blunt will last him for 2 days. He denies using other illicit drugs. However, UDS positive for cocaine and THC. He states that he has received substance abuse treatment here ([facility]) 3 different times. He denies other inpatient psychiatric hospitalizations or residential treatment.   He denies suicidal thoughts. He denies past suicide attempts. He denies a history of self injurious behaviors. He denies homicidal thoughts. He reports feeling really depressed since the beginning of the year. He endorses depressive symptoms of isolating, sadness, irritability when he does not drink, anhedonia, decreased motivation, hopelessness, and worthlessness. He states that when he drinks alcohol  he feels like the life of  the party but other people do not like it when he becomes obnoxious and his mood switches and he becomes angry. He states that he can only sleep when he is drinking alcohol . He reports a decreased appetite and states that he eats 0-1 meal per day. He states that he has loss about 15 pounds over the past year. He denies auditory or visual hallucinations. Objectively, no signs of acute psychosis on exam.   He states that he has been homeless for 14 years due to his alcohol  problem and states that occasionally he will stay with his girlfriend or his grandmother. 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 He denies outpatient psychiatry or counseling. He denies taking prescribed medications. He reports a medical history of asthma and states that occasionally he uses an inhaler. He denies a family psychiatric history including no completed suicides or drug addiction.  Flowsheet Row ED from 11/27/2023 in [facility] ED from 04/27/2023 in [facility] Emergency Department at [facility] ED from 02/16/2023 in [facility]  C-SSRS RISK CATEGORY No Risk No Risk No Risk    Psychiatric Specialty Exam  Presentation  General Appearance:Appropriate for Environment  Eye Contact:Fair  Speech:Clear and Coherent  Speech Volume:Normal  Handedness:Right   Mood and Affect  Mood: Depressed  Affect: Congruent   Thought Process  Thought Processes: Coherent  Descriptions of Associations:Intact  Orientation:Full (Time, Place and Person)  Thought Content:Logical  Diagnosis of Schizophrenia or Schizoaffective disorder in past: No data recorded  Hallucinations:None  Ideas of Reference:None  Suicidal  Thoughts:No  Homicidal Thoughts:No   Sensorium  Memory: Immediate Fair; Recent Fair; Remote  Fair  Judgment: Fair  Insight: Fair   Chartered certified accountant: Fair  Attention Span: Fair  Recall: [employer] of Knowledge: Fair  Language: Fair   Psychomotor Activity  Psychomotor Activity: Normal   Assets  Assets: Manufacturing systems engineer; Desire for Improvement; Physical Health; Leisure Time   Sleep  Sleep: Poor  Number of hours:  5   Physical Exam: Physical Exam Cardiovascular:     Rate and Rhythm: Normal rate.  Pulmonary:     Effort: Pulmonary effort is normal.  Musculoskeletal:        General: Normal range of motion.     Cervical back: Normal range of motion.  Neurological:     Mental Status: He is alert and oriented to person, place, and time.    Review of Systems  Constitutional: Negative.   HENT: Negative.    Eyes: Negative.   Respiratory: Negative.    Cardiovascular: Negative.   Gastrointestinal: Negative.   Neurological: Negative.   Psychiatric/Behavioral:  Positive for depression and substance abuse.    Blood pressure 126/86, pulse 96, temperature 98.9 F (37.2 C), temperature source Oral, resp. rate 17, SpO2 99%. There is no height or weight on file to calculate BMI.  Musculoskeletal: Strength & Muscle Tone: within normal limits Gait & Station: normal Patient leans: N/A   [facility] MSE Discharge Disposition for Follow up and Recommendations: Based on my evaluation I certify that psychiatric inpatient services furnished can reasonably be expected to improve the patient's condition which I recommend transfer to an appropriate accepting facility.   [facility] for alcohol  detox and mood stabilization.   Medication regimen Add ativan  taper 1 mg for alcohol  use  Add vistaril  25 mg po TID PRN for anxiety Trazodone  50 mg po at bedtime PRN for sleep  Labs Lab Orders         CBC with Differential/Platelet         Comprehensive metabolic panel         Hemoglobin A1c          Ethanol         Lipid panel         TSH         RPR         HIV Antibody (routine testing w rflx)         POCT Urine Drug Screen - (I-Screen)    EKG     Druanne Bosques L, NP 11/27/2023, 1:47 PM

## 2023-11-27 NOTE — Group Note (Signed)
 Group Topic: Communication  Group Date: 11/27/2023 Start Time: 1710 End Time: 1750 Facilitators: Daved Tinnie HERO, RN  Department: [facility]  Number of Participants: 9  Group Focus: communication Treatment Modality:  Psychoeducation Interventions utilized were patient education Purpose: improve communication skills  Name: Howard Adams Date of Birth: 1986/01/07  MR: [id number]    Level of Participation: active Quality of Participation: attentive and cooperative Interactions with others: gave feedback Mood/Affect: appropriate Triggers (if applicable): n/a Cognition: coherent/clear Progress: Significant Response: pt says he will help to regulate his emotions by not drinking alcohol  and learning how to use his words to communicate Plan: patient will be encouraged to attend future RN groups  Patients Problems:  Patient Active Problem List   Diagnosis Date Noted   Alcohol  use with alcohol -induced mood disorder (HCC) 11/27/2023   Tobacco abuse counseling 07/21/2022   Chronic dental pain 07/21/2022   Liver lesion 07/21/2022   Elevated LFTs 07/21/2022   Marijuana abuse 07/21/2022   Substance induced mood disorder (HCC) 04/21/2022   Alcohol  abuse 03/19/2022   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

## 2023-11-27 NOTE — ED Notes (Addendum)
 Pt admitted to [facility] requesting detox from ETOH/cocaine. Pt denies SI/HI/AVH. Pt states, I have to quit drinking before I lose my job. I have a great job and my boss is good to me, so I can't mess this up. I've been drinking since I was about 18 but I only drink beer. I need to quit. Praise and support given. Pt reports using cocaine and ETOH just prior to entering facility today. Pt request residential tx after detox. Calm, cooperative throughout interview process. Skin assessment completed. Oriented to unit. Meal and drink offered. Pt verbally contract for safety. Will monitor for safety.

## 2023-11-27 NOTE — ED Notes (Signed)
 Pt sitting in dayroom actively participating in group. No acute distress noted. No concerns voiced. Informed pt to notify staff with any needs or assistance. Pt verbalized understanding and agreement. Will continue to monitor for safety.

## 2023-11-27 NOTE — Progress Notes (Signed)
   11/27/23 1208  [facility] Triage Screening (Walk-ins at [facility] only)  How Did You Hear About Us ? Self  What Is the Reason for Your Visit/Call Today? Howard Adams presents to [facility] voluntarily unaccompanied. Pt states that he has an alcohol  problem. Pt states that alcohol  has been a problem for him for the last 12 years. Pt currently denies SI, HI, and AVH. Pt states that he smoked marijuana last night and drank 2 16oz Bud Ice beers 2 hours ago. Pt states that we can best assist him by admitting him today because he really wants to stop this time.  How Long Has This Been Causing You Problems? > than 6 months  Have You Recently Had Any Thoughts About Hurting Yourself? No  Are You Planning to Commit Suicide/Harm Yourself At This time? No  Have you Recently Had Thoughts About Hurting Someone Sherral? No  Are You Planning To Harm Someone At This Time? No  Physical Abuse Denies  Verbal Abuse Yes, past (Comment)  Sexual Abuse Denies  Exploitation of patient/patient's resources Denies  Self-Neglect Yes, present (Comment)  Are you currently experiencing any auditory, visual or other hallucinations? No  Have You Used Any Alcohol  or Drugs in the Past 24 Hours? Yes  What Did You Use and How Much? 2 hours ago - alcohol  (bud ice 2 -16oz) & Marijuana last night  Do you have any current medical co-morbidities that require immediate attention? No  Clinician description of patient physical appearance/behavior: poor eye contact with red eyes, looking down at the floor, calm, cooperative  What Do You Feel Would Help You the Most Today? Alcohol  or Drug Use Treatment  If access to [facility] Urgent [facility] was not available, would you have sought care in the Emergency Department? No  Determination of Need Routine (7 days)  Options For Referral Medication Management;Facility-Based Crisis;Chemical Dependency Intensive Outpatient Therapy (CDIOP);BH Urgent Care;Outpatient Therapy

## 2023-11-27 NOTE — Discharge Instructions (Signed)
 Patient accepted to the [facility]

## 2023-11-27 NOTE — ED Notes (Signed)
 Pt admitted to [facility] requesting detox from ETOH/cocaine. Pt denies SI/HU/AVH. Pt states, I've been drinking since I was about 38 yo. I just used cocaine today just before I came in here. I figured I might as well get it out of my system too. I need help to stop drinking beer. I have a great job with a great boss. I can't mess this up. Support provided. Calm, cooperative throughout interview process. Skin assessment completed. Oriented to unit. Meal and drink offered.  Pt verbally contract for safety. Will monitor for safety.

## 2023-11-28 DIAGNOSIS — F1220 Cannabis dependence, uncomplicated: Secondary | ICD-10-CM | POA: Diagnosis not present

## 2023-11-28 DIAGNOSIS — F17200 Nicotine dependence, unspecified, uncomplicated: Secondary | ICD-10-CM | POA: Diagnosis not present

## 2023-11-28 DIAGNOSIS — F10230 Alcohol dependence with withdrawal, uncomplicated: Secondary | ICD-10-CM | POA: Diagnosis not present

## 2023-11-28 DIAGNOSIS — F32A Depression, unspecified: Secondary | ICD-10-CM | POA: Diagnosis not present

## 2023-11-28 LAB — RPR: RPR Ser Ql: NONREACTIVE

## 2023-11-28 MED ORDER — NALTREXONE HCL 50 MG PO TABS
50.0000 mg | ORAL_TABLET | Freq: Every day | ORAL | Status: DC
  Administered 2023-11-28 – 2023-12-01 (×4): 50 mg via ORAL
  Filled 2023-11-28 (×4): qty 1

## 2023-11-28 NOTE — ED Notes (Signed)
 Patient resting quietly in bed with eyes closed with unlabored breathing. Q 15 minute safety checks remain in place.  Pt remains safe on the unit at this time.

## 2023-11-28 NOTE — ED Provider Notes (Signed)
 Facility Based Crisis Admission H&P  Date: 11/28/23 Patient Name: Howard Adams MRN: [id number] Chief Complaint: I want to get sober this time.  Diagnoses:  Final diagnoses:  Alcohol  dependence with uncomplicated withdrawal (HCC)  Cannabis dependence (HCC)  Substance induced mood disorder (HCC)    HPI:  38 y.o. male patient with a documented psychiatric history of MDD, alcohol  use disorder severe, adjustment disorder with mixed disturbance of emotions and substance-induced mood disorder who presented to the [facility] voluntary with a complaint of alcohol  problem. Patient states that he has been drinking alcohol   since he was 38 years old. He states that for the past 6 months he has been drinking mostly beer, on average 120 ounces per day. He states that he last consumed alcohol  2 hours ago, 32 ounces of beer. He is not experiencing alcohol  withdrawal symptoms at this time. He states that in the past when withdrawing from alcohol  he usually experiences shakes and sweats and denies a history of alcohol  withdrawal seizures or delirium tremens. He reports smoking marijuana since he was 38 years old, on average he smokes every other day and states that one blunt will last him for 2 days. He denies using other illicit drugs. However, UDS positive for cocaine and THC. He states that he has received substance abuse treatment here ([facility]) 3 different times. He denies other inpatient psychiatric hospitalizations or residential treatment.   Patient states he has been here 3 times before to detox from alcohol  and that he wants to go to inpatient rehab this time. Admits he left prematurely and relapsed within a week. Patient states he drinks beer  (40-120 oz a day) and states he relapsed in November 2023 and has been drinking daily since then. He has tried disulfiram before but would like to try naltrexone  now. States his girlfriend kicked him out and he's been staying  with his grandmother. Patient states he would [facility]. Patient notes depressed mood for several months but admits he has not had any extended period of sobriety. Denies prior history of mania or psychosis. Patient states he will think about antidepressant but would like to try Ativan  taper and naltrexone  first. Denies SI, HI or AVH. Denies wanting to be dead but has felt hopless with low energy and anhedonia. Reports fair sleep and appetite today. Patient denies a history of Dts or seizures. Patient denies any other substance abuse however UDS + THC and cocaine.  He reports smoking marijuana since he was 38 years old, on average he smokes every other day and states that one blunt will last him for 2 days.    PHQ 2-9:  Flowsheet Row Office Visit from 07/21/2022 in [facility] ED from 04/20/2022 in [facility] ED from 03/19/2022 in [facility]  Thoughts that you would be better off dead, or of hurting yourself in some way Not at all Not at all Not at all  PHQ-9 Total Score 8 8 0    Flowsheet Row ED from 11/27/2023 in [facility] Most recent reading at 11/27/2023  3:32 PM ED from 11/27/2023 in [facility] Most recent reading at 11/27/2023  3:23 PM ED from 04/27/2023 in [facility] Emergency Department at [facility] Most recent reading at 04/27/2023  1:03 PM  C-SSRS RISK CATEGORY No Risk No Risk No Risk  Screenings    Flowsheet Row Most Recent Value  CIWA-Ar Total 0    Total Time spent with patient: 45 minutes  Musculoskeletal  Strength & Muscle Tone: within normal limits Gait & Station: normal Patient leans: N/A  Psychiatric Specialty Exam  Presentation General Appearance:  Appropriate for Environment  Eye Contact: Fair  Speech: Clear and Coherent  Speech  Volume: Normal  Handedness: Right   Mood and Affect  Mood: Depressed  Affect: Congruent   Thought Process  Thought Processes: Coherent  Descriptions of Associations:Intact  Orientation:Full (Time, Place and Person)  Thought Content:Logical  Diagnosis of Schizophrenia or Schizoaffective disorder in past: No data recorded  Hallucinations:Hallucinations: None  Ideas of Reference:None  Suicidal Thoughts:Suicidal Thoughts: No  Homicidal Thoughts:Homicidal Thoughts: No   Sensorium  Memory: Immediate Fair; Recent Fair; Remote Fair  Judgment: Fair  Insight: Fair   Art therapist  Concentration: Fair  Attention Span: Fair  Recall: [employer] of Knowledge: Fair  Language: Fair   Psychomotor Activity  Psychomotor Activity: Psychomotor Activity: Normal   Assets  Assets: Communication Skills; Desire for Improvement; Physical Health; Leisure Time   Sleep  Sleep: Sleep: Poor   Nutritional Assessment (For [facility] and [facility] admissions only) Has the patient had a weight loss or gain of 10 pounds or more in the last 3 months?: No Has the patient had a decrease in food intake/or appetite?: Yes Does the patient have dental problems?: No Does the patient have eating habits or behaviors that may be indicators of an eating disorder including binging or inducing vomiting?: No Has the patient recently lost weight without trying?: 0 Has the patient been eating poorly because of a decreased appetite?: 1 Malnutrition Screening Tool Score: 1    Physical exam:  General: Well developed, well nourished, [place] male Pupils: Normal at 3mm Respiratory: Breathing is unlabored.  Cardiovascular: No edema.  Language: No anomia, no aphasia Muscle strength and tone-pt moving all extremities.  Gait not assessed as pt remained in bed.  Neuro: Facial muscles are symmetric. Pt without tremor, no evidence of hyperarousal.  Review of Systems  Constitutional:  Negative.   HENT: Negative.    Eyes: Negative.   Respiratory: Negative.    Cardiovascular: Negative.   Gastrointestinal: Negative.   Genitourinary: Negative.   Musculoskeletal: Negative.   Skin: Negative.   Neurological: Negative.   Endo/Heme/Allergies: Negative.   Psychiatric/Behavioral:  Positive for depression and substance abuse. The patient is nervous/anxious.     Blood pressure 120/82, pulse 80, temperature 97.8 F (36.6 C), temperature source Oral, resp. rate 16, SpO2 100%. There is no height or weight on file to calculate BMI.  Past Psychiatric History:  Psychiatric diagnoses: alcohol  dependence, substance induced mood disorder Suicide attempts :denies Homicide: denies Admissions: three prior admissions to [facility] otherwise denies   Is the patient at risk to self? Yes  Has the patient been a risk to self in the past 6 months? Yes .    Has the patient been a risk to self within the distant past? Yes   Is the patient a risk to others? No   Has the patient been a risk to others in the past 6 months? No   Has the patient been a risk to others within the distant past? No   Past Medical History: denies Family History: denies and denies history of suicide attempts Social History: kicked out by girlfriend with strained relationship, staying with grandmother  Last Labs:  Admission on 11/27/2023, Discharged on 11/27/2023  Component Date Value Ref Range Status   WBC 11/27/2023 3.9 (L)  4.0 - 10.5 K/uL Final   RBC 11/27/2023 4.28  4.22 - 5.81 MIL/uL Final   Hemoglobin 11/27/2023 13.8  13.0 - 17.0 g/dL Final   HCT 92/95/7974 39.5  39.0 - 52.0 % Final   MCV 11/27/2023 92.3  80.0 - 100.0 fL Final   MCH 11/27/2023 32.2  26.0 - 34.0 pg Final   MCHC 11/27/2023 34.9  30.0 - 36.0 g/dL Final   RDW 92/95/7974 13.6  11.5 - 15.5 % Final   Platelets 11/27/2023 169  150 - 400 K/uL Final   nRBC 11/27/2023 0.0  0.0 - 0.2 % Final   Neutrophils Relative % 11/27/2023 58  % Final   Neutro Abs  11/27/2023 2.3  1.7 - 7.7 K/uL Final   Lymphocytes Relative 11/27/2023 30  % Final   Lymphs Abs 11/27/2023 1.2  0.7 - 4.0 K/uL Final   Monocytes Relative 11/27/2023 9  % Final   Monocytes Absolute 11/27/2023 0.3  0.1 - 1.0 K/uL Final   Eosinophils Relative 11/27/2023 1  % Final   Eosinophils Absolute 11/27/2023 0.0  0.0 - 0.5 K/uL Final   Basophils Relative 11/27/2023 1  % Final   Basophils Absolute 11/27/2023 0.0  0.0 - 0.1 K/uL Final   Immature Granulocytes 11/27/2023 1  % Final   Abs Immature Granulocytes 11/27/2023 0.02  0.00 - 0.07 K/uL Final   Performed at [facility], [address] [place], [place] [address]   Sodium 11/27/2023 142  135 - 145 mmol/L Final   Potassium 11/27/2023 4.1  3.5 - 5.1 mmol/L Final   Chloride 11/27/2023 109  98 - 111 mmol/L Final   CO2 11/27/2023 21 (L)  22 - 32 mmol/L Final   Glucose, Bld 11/27/2023 79  70 - 99 mg/dL Final   Glucose reference range applies only to samples taken after fasting for at least 8 hours.   BUN 11/27/2023 5 (L)  6 - 20 mg/dL Final   Creatinine, Ser 11/27/2023 0.81  0.61 - 1.24 mg/dL Final   Calcium 92/95/7974 9.2  8.9 - 10.3 mg/dL Final   Total Protein 92/95/7974 7.0  6.5 - 8.1 g/dL Final   Albumin 92/95/7974 3.7  3.5 - 5.0 g/dL Final   AST 92/95/7974 29  15 - 41 U/L Final   ALT 11/27/2023 28  0 - 44 U/L Final   Alkaline Phosphatase 11/27/2023 74  38 - 126 U/L Final   Total Bilirubin 11/27/2023 0.4  0.0 - 1.2 mg/dL Final   GFR, Estimated 11/27/2023 >60  >60 mL/min Final   Comment: (NOTE) Calculated using the CKD-EPI Creatinine Equation (2021)    Anion gap 11/27/2023 12  5 - 15 Final   Performed at [facility], [address] [place], [place] [address]   Hgb A1c MFr Bld 11/27/2023 5.2  4.8 - 5.6 % Final   Comment: (NOTE) Diagnosis of Diabetes The following HbA1c ranges recommended by the American Diabetes Association (ADA) may be used as an aid in the diagnosis of diabetes mellitus.  Hemoglobin              Suggested A1C NGSP%              Diagnosis  <5.7                   Non Diabetic  5.7-6.4  Pre-Diabetic  >6.4                   Diabetic  <7.0                   Glycemic control for                       adults with diabetes.     Mean Plasma Glucose 11/27/2023 102.54  mg/dL Final   Performed at [facility], [address], [place], [place] [address]   Alcohol , Ethyl (B) 11/27/2023 265 (H)  <15 mg/dL Final   Comment: (NOTE) For medical purposes only. Performed at [facility], [address], [place], [place] [address]    Cholesterol 11/27/2023 160  0 - 200 mg/dL Final   Triglycerides 92/95/7974 99  <150 mg/dL Final   HDL 92/95/7974 73  >40 mg/dL Final   Total CHOL/HDL Ratio 11/27/2023 2.2  RATIO Final   VLDL 11/27/2023 20  0 - 40 mg/dL Final   LDL Cholesterol 11/27/2023 67  0 - 99 mg/dL Final   Comment:        Total Cholesterol/HDL:CHD Risk Coronary Heart Disease Risk Table                     Men   Women  1/2 Average Risk   3.4   3.3  Average Risk       5.0   4.4  2 X Average Risk   9.6   7.1  3 X Average Risk  23.4   11.0        Use the calculated Patient Ratio above and the CHD Risk Table to determine the patient's CHD Risk.        ATP III CLASSIFICATION (LDL):  <100     mg/dL   Optimal  899-870  mg/dL   Near or Above                    Optimal  130-159  mg/dL   Borderline  839-810  mg/dL   High  >809     mg/dL   Very High Performed at [facility], [address] [place], [place] [address]    TSH 11/27/2023 0.482  0.350 - 4.500 uIU/mL Final   Comment: Performed by a 3rd Generation assay with a functional sensitivity of <=0.01 uIU/mL. Performed at [facility], [address], [place], [place] [address]    RPR Ser Ql 11/27/2023 NON REACTIVE  NON REACTIVE Final   Performed at [facility], [address], [place], [place] [address]   POC Amphetamine UR 11/27/2023 None Detected  NONE DETECTED (Cut Off Level 1000  ng/mL) Final   POC Secobarbital (BAR) 11/27/2023 None Detected  NONE DETECTED (Cut Off Level 300 ng/mL) Final   POC Buprenorphine (BUP) 11/27/2023 None Detected  NONE DETECTED (Cut Off Level 10 ng/mL) Final   POC Oxazepam (BZO) 11/27/2023 None Detected  NONE DETECTED (Cut Off Level 300 ng/mL) Final   POC Cocaine UR 11/27/2023 Positive (A)  NONE DETECTED (Cut Off Level 300 ng/mL) Final   POC Methamphetamine UR 11/27/2023 None Detected  NONE DETECTED (Cut Off Level 1000 ng/mL) Final   POC Morphine  11/27/2023 None Detected  NONE DETECTED (Cut Off Level 300 ng/mL) Final   POC Methadone UR 11/27/2023 None Detected  NONE DETECTED (Cut Off Level 300 ng/mL) Final   POC Oxycodone   UR 11/27/2023 None Detected  NONE DETECTED (Cut Off Level 100 ng/mL) Final   POC Marijuana UR 11/27/2023 Positive (A)  NONE DETECTED (Cut Off Level 50 ng/mL) Final   HIV Screen 4th Generation wRfx 11/27/2023 Non Reactive  Non Reactive Final   Performed at [facility], [address], [place], [place] [address]    Allergies: Shellfish allergy  Medications:  Facility Ordered Medications  Medication   acetaminophen  (TYLENOL ) tablet 650 mg   alum & mag hydroxide-simeth (MAALOX/MYLANTA) 200-200-20 MG/5ML suspension 30 mL   magnesium  hydroxide (MILK OF MAGNESIA) suspension 30 mL   haloperidol  (HALDOL ) tablet 5 mg   And   diphenhydrAMINE  (BENADRYL ) capsule 50 mg   haloperidol  lactate (HALDOL ) injection 5 mg   And   diphenhydrAMINE  (BENADRYL ) injection 50 mg   And   LORazepam  (ATIVAN ) injection 2 mg   haloperidol  lactate (HALDOL ) injection 10 mg   And   diphenhydrAMINE  (BENADRYL ) injection 50 mg   And   LORazepam  (ATIVAN ) injection 2 mg   hydrOXYzine  (ATARAX ) tablet 25 mg   traZODone  (DESYREL ) tablet 50 mg   thiamine  (VITAMIN B1) injection 100 mg   thiamine  (VITAMIN B1) tablet 100 mg   multivitamin with minerals tablet 1 tablet   LORazepam  (ATIVAN ) tablet 1 mg   loperamide  (IMODIUM ) capsule 2-4 mg    ondansetron  (ZOFRAN -ODT) disintegrating tablet 4 mg   LORazepam  (ATIVAN ) tablet 1 mg   Followed by   NOREEN ON 11/29/2023] LORazepam  (ATIVAN ) tablet 1 mg   Followed by   NOREEN ON 11/30/2023] LORazepam  (ATIVAN ) tablet 1 mg   Followed by   NOREEN ON 12/01/2023] LORazepam  (ATIVAN ) tablet 1 mg   naltrexone  (DEPADE) tablet 50 mg    Long Term Goals: Improvement in symptoms so as ready for discharge  Short Term Goals: Patient will verbalize feelings in meetings with treatment team members., Patient will attend at least of 50% of the groups daily., Pt will complete the PHQ9 on admission, day 3 and discharge., Patient will participate in completing the [place] Suicide Severity Rating Scale, Patient will score a low risk of violence for 24 hours prior to discharge, and Patient will take medications as prescribed daily.  Medical Decision Making  Has decision making capacity  38 y.o. 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    He states that in the past when withdrawing from alcohol  he usually experiences shakes and sweats and denies a history of alcohol  withdrawal seizures or delirium tremens. He reports smoking marijuana since he was 38 years old, on average he smokes every other day and states that one blunt will last him for 2 days. He denies using other illicit drugs. However, UDS positive for cocaine and THC. He states that he has received substance abuse treatment here ([facility]) 3 different times. He denies other inpatient psychiatric hospitalizations or residential treatment.  Denies SI or HI. Agreeable to naltrexone  for alcohol  cessation. Presentation most consistent with substance induced mood disorder. Patient will consider antidepressant. Denies  prior psychiatric history.   #substance induced depression - patient will consider starting Lexapro  or Zoloft but states he has not had period of sobriety  #alcohol  use disorder -continue Ativan  taper + CIWA with PRN Ativan  -starting naltrexone  50 mg qdaily after discussing r/b/a  #cannabis dependence - [facility] referral  Recommendations  Based on my evaluation the patient does not appear to have an emergency medical condition.  Lorretta Kerce, MD 11/28/23  2:38 PM

## 2023-11-28 NOTE — ED Notes (Signed)
 Patient alert & oriented x4. Denies intent to harm self or others when asked. Denies A/VH. Patient denies any physical complaints when asked. No acute distress noted. Scheduled medications administered with no complications. Support and encouragement provided. Routine safety checks conducted per facility protocol. Encouraged patient to notify staff if any thoughts of harm towards self or others arise. Patient verbalizes understanding and agreement.

## 2023-11-28 NOTE — Group Note (Signed)
 Group Topic: Relapse and Recovery  Group Date: 11/28/2023 Start Time: 2000 End Time: 2051 Facilitators: Tanda Ogren D, NT  Department: [facility]  Number of Participants: 5  Group Focus: community group and substance abuse education Treatment Modality:  Psychoeducation Interventions utilized were support Purpose: relapse prevention strategies  Name: Alger Kerstein Date of Birth: 1985/07/10  MR: [id number]    Level of Participation: moderate Quality of Participation: cooperative Interactions with others: gave feedback Mood/Affect: appropriate Triggers (if applicable): n/a Cognition: coherent/clear Progress: Significant Response: n/a Plan: follow-up needed  Patients Problems:  Patient Active Problem List   Diagnosis Date Noted   Alcohol  use with alcohol -induced mood disorder (HCC) 11/27/2023   Tobacco abuse counseling 07/21/2022   Chronic dental pain 07/21/2022   Liver lesion 07/21/2022   Elevated LFTs 07/21/2022   Marijuana abuse 07/21/2022   Substance induced mood disorder (HCC) 04/21/2022   Alcohol  abuse 03/19/2022   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

## 2023-11-28 NOTE — Group Note (Signed)
 Group Topic: Communication  Group Date: 11/28/2023 Start Time: 0900 End Time: 0930 Facilitators: Nena Lesley PARAS, NT  Department: [facility]  Number of Participants: 7  Group Focus: check in and community group Treatment Modality:  Psychoeducation Interventions utilized were clarification, exploration, support, and problem solving Purpose: explore maladaptive thinking, improve communication skills, and increase insight  Name: Howard Adams Date of Birth: August 28, 1985  MR: [id number]    Level of Participation: minimal Quality of Participation: withdrawn Interactions with others: Pt was quiet during group Mood/Affect: flat Triggers (if applicable): N/A Cognition: coherent/clear Progress: Gaining insight Response: PT was quiet during the group and did not voice any concerns  Plan: patient will be encouraged to continue coming to groups and inform staff if any issues arise   Patients Problems:  Patient Active Problem List   Diagnosis Date Noted   Alcohol  use with alcohol -induced mood disorder (HCC) 11/27/2023   Tobacco abuse counseling 07/21/2022   Chronic dental pain 07/21/2022   Liver lesion 07/21/2022   Elevated LFTs 07/21/2022   Marijuana abuse 07/21/2022   Substance induced mood disorder (HCC) 04/21/2022   Alcohol  abuse 03/19/2022   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

## 2023-11-28 NOTE — Group Note (Signed)
 Group Topic: Relaxation  Group Date: 11/28/2023 Start Time: 1400 End Time: 1430 Facilitators: Carletha Iha, RN  Department: [facility]  Number of Participants: 8  Group Focus: relaxation Treatment Modality:  Psychoeducation Interventions utilized were exploration Purpose: enhance coping skills  Name: Howard Adams Date of Birth: 06-08-1985  MR: [id number]    Level of Participation:  Did not attend Quality of Participation:  Interactions with others:  Mood/Affect:  Triggers (if applicable):  Cognition:  Progress:  Response:  Plan:   Patients Problems:  Patient Active Problem List   Diagnosis Date Noted   Alcohol  use with alcohol -induced mood disorder (HCC) 11/27/2023   Tobacco abuse counseling 07/21/2022   Chronic dental pain 07/21/2022   Liver lesion 07/21/2022   Elevated LFTs 07/21/2022   Marijuana abuse 07/21/2022   Substance induced mood disorder (HCC) 04/21/2022   Alcohol  abuse 03/19/2022   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

## 2023-11-28 NOTE — ED Notes (Addendum)
 Patient is in the bedroom room calm and sleeping with eyes closed.  Respirations are even and unlabored. Environment secured per policy. Will monitor for safety.

## 2023-11-28 NOTE — ED Notes (Signed)
 Patient resting with eyes closed in no apparent acute distress. Respirations even and unlabored. Environment secured. Safety checks in place according to facility policy.

## 2023-11-28 NOTE — ED Notes (Signed)
 Pt is observed watching television in the dayroom. He c/o boredom and depression. Pt denies SI/HI/AVH. Pt denies physical complaints. Pt is med compliant and safe on the unit at this time with Q 15 min safety checks in place.

## 2023-11-29 DIAGNOSIS — F1220 Cannabis dependence, uncomplicated: Secondary | ICD-10-CM | POA: Diagnosis not present

## 2023-11-29 DIAGNOSIS — F17200 Nicotine dependence, unspecified, uncomplicated: Secondary | ICD-10-CM | POA: Diagnosis not present

## 2023-11-29 DIAGNOSIS — F32A Depression, unspecified: Secondary | ICD-10-CM | POA: Diagnosis not present

## 2023-11-29 DIAGNOSIS — F10230 Alcohol dependence with withdrawal, uncomplicated: Secondary | ICD-10-CM | POA: Diagnosis not present

## 2023-11-29 NOTE — Group Note (Signed)
 Group Topic: Understanding Self  Group Date: 11/29/2023 Start Time: 1715 End Time: 1740 Facilitators: Daved Tinnie HERO, RN  Department: [facility]  Number of Participants: 9  Group Focus: psychiatric education Treatment Modality:  Psychoeducation Interventions utilized were exploration Purpose: increase insight  Name: Howard Adams Date of Birth: Nov 10, 1985  MR: [id number]    Level of Participation: active Quality of Participation: attentive and cooperative Interactions with others: gave feedback Mood/Affect: appropriate Triggers (if applicable): n/a Cognition: coherent/clear Progress: Significant Response: pt says he will help to make sure his goals are in alignment with his values by attending residential program and remaining one year sober from alcohol  Plan: patient will be encouraged to attend future RN education groups  Patients Problems:  Patient Active Problem List   Diagnosis Date Noted   Alcohol  use with alcohol -induced mood disorder (HCC) 11/27/2023   Tobacco abuse counseling 07/21/2022   Chronic dental pain 07/21/2022   Liver lesion 07/21/2022   Elevated LFTs 07/21/2022   Marijuana abuse 07/21/2022   Substance induced mood disorder (HCC) 04/21/2022   Alcohol  abuse 03/19/2022   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

## 2023-11-29 NOTE — ED Notes (Signed)
 Patient diagnosed of alcohol  disorder. He reported that he is doing better. No sleep disturbance reported. His eating and BM remains as usual. Denied SI. He is cooperative. He participate in group activities. Negative to psychotic symptoms. Denied having pain. Compliant with his medications.

## 2023-11-29 NOTE — ED Notes (Signed)
 Patient is in the bedroom calm and sleeping.  NAD. Respirations even and unlabored. Will monitor for safety.

## 2023-11-29 NOTE — Group Note (Signed)
 Group Topic: Fears and Unhealthy Coping Skills  Group Date: 11/29/2023 Start Time: 1930 End Time: 2000 Facilitators: Verdon Jacqualyn BRAVO, NT  Department: [facility]  Number of Participants: 9  Group Focus: self-awareness Treatment Modality:  Individual Therapy Interventions utilized were assignment Purpose: trigger / craving management  Name: Howard Adams Date of Birth: 10-25-1985  MR: [id number]   N/a Level of Participation: n/a Quality of Participation: n/a Interactions with others: n/a Mood/Affect: n/a Triggers (if applicable): n/a Cognition: n/a Progress: Other Response: n/a Plan: patient will be encouraged to attend group  Patients Problems:  Patient Active Problem List   Diagnosis Date Noted   Alcohol  use with alcohol -induced mood disorder (HCC) 11/27/2023   Tobacco abuse counseling 07/21/2022   Chronic dental pain 07/21/2022   Liver lesion 07/21/2022   Elevated LFTs 07/21/2022   Marijuana abuse 07/21/2022   Substance induced mood disorder (HCC) 04/21/2022   Alcohol  abuse 03/19/2022   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

## 2023-11-29 NOTE — ED Notes (Signed)
 Pt affirms to have eaten breakfast, was also provided with snack. Pt denis si hi avh- verbal contract for safety provided. Pt denies pain and physical discomforts. Pt denies any improvement or worsening of mental state since admission to facility. Pt has been compliant with all medications. Pt currently in dayroom watching TV, no distress reported or observed.

## 2023-11-29 NOTE — ED Notes (Signed)
 Pt asleep. NAD, will continue to monitor for safety.

## 2023-11-29 NOTE — ED Provider Notes (Signed)
 Behavioral Health Progress Note  Date and Time: 11/29/2023 2:08 PM Name: Howard Adams MRN:  [id number]  Subjective:  Patient reports feeling depressed and hopeless about my situation and what will happen. States he is upset because he is on bad terms with his girlfriend. He reports he wants to go to inpatient rehab. Patient is tolerating Ativan  taper and naltrexone  well and denies side effects. Reports fair sleep and appetite. Denies SI or HI. Denies wanting to be dead. Denies AVH. Discussed starting antidepressant however patient states it's the alcohol  just his past week. States he does not wish to start as notes substance induced mood disorder.  Diagnosis:  Final diagnoses:  Alcohol  dependence with uncomplicated withdrawal (HCC)  Cannabis dependence (HCC)  Substance induced mood disorder (HCC)    Total Time spent with patient: 20 minutes  Past Psychiatric History:  Psychiatric diagnoses: alcohol  dependence, substance induced mood disorder Suicide attempts :denies Homicide: denies Admissions: three prior admissions to [facility] otherwise denies  Past Medical History: denies Family History: denies and denies history of suicide attempts Social History: kicked out by girlfriend with strained relationship, staying with grandmother  Additional Social History:                         Sleep: Fair  Appetite:  Fair  Current Medications:  Current Facility-Administered Medications  Medication Dose Route Frequency Provider Last Rate Last Admin   acetaminophen  (TYLENOL ) tablet 650 mg  650 mg Oral Q6H PRN White, Patrice L, NP       alum & mag hydroxide-simeth (MAALOX/MYLANTA) 200-200-20 MG/5ML suspension 30 mL  30 mL Oral Q4H PRN White, Patrice L, NP       haloperidol  (HALDOL ) tablet 5 mg  5 mg Oral TID PRN White, Patrice L, NP       And   diphenhydrAMINE  (BENADRYL ) capsule 50 mg  50 mg Oral TID PRN White, Patrice L, NP       haloperidol  lactate (HALDOL ) injection 5 mg  5 mg  Intramuscular TID PRN White, Patrice L, NP       And   diphenhydrAMINE  (BENADRYL ) injection 50 mg  50 mg Intramuscular TID PRN White, Patrice L, NP       And   LORazepam  (ATIVAN ) injection 2 mg  2 mg Intramuscular TID PRN White, Patrice L, NP       haloperidol  lactate (HALDOL ) injection 10 mg  10 mg Intramuscular TID PRN White, Patrice L, NP       And   diphenhydrAMINE  (BENADRYL ) injection 50 mg  50 mg Intramuscular TID PRN White, Patrice L, NP       And   LORazepam  (ATIVAN ) injection 2 mg  2 mg Intramuscular TID PRN White, Patrice L, NP       hydrOXYzine  (ATARAX ) tablet 25 mg  25 mg Oral TID PRN White, Patrice L, NP   25 mg at 11/28/23 2126   loperamide  (IMODIUM ) capsule 2-4 mg  2-4 mg Oral PRN White, Patrice L, NP       LORazepam  (ATIVAN ) tablet 1 mg  1 mg Oral Q6H PRN White, Patrice L, NP       LORazepam  (ATIVAN ) tablet 1 mg  1 mg Oral TID White, Patrice L, NP   1 mg at 11/29/23 9062   Followed by   NOREEN ON 11/30/2023] LORazepam  (ATIVAN ) tablet 1 mg  1 mg Oral BID White, Patrice L, NP       Followed by   NOREEN ON  12/01/2023] LORazepam  (ATIVAN ) tablet 1 mg  1 mg Oral Daily White, Patrice L, NP       magnesium  hydroxide (MILK OF MAGNESIA) suspension 30 mL  30 mL Oral Daily PRN White, Patrice L, NP       multivitamin with minerals tablet 1 tablet  1 tablet Oral Daily White, Patrice L, NP   1 tablet at 11/29/23 9062   naltrexone  (DEPADE) tablet 50 mg  50 mg Oral Daily Sendy Pluta, MD   50 mg at 11/29/23 9062   ondansetron  (ZOFRAN -ODT) disintegrating tablet 4 mg  4 mg Oral Q6H PRN White, Patrice L, NP       thiamine  (VITAMIN B1) injection 100 mg  100 mg Intramuscular Once White, Patrice L, NP       thiamine  (VITAMIN B1) tablet 100 mg  100 mg Oral Daily White, Patrice L, NP   100 mg at 11/29/23 9062   traZODone  (DESYREL ) tablet 50 mg  50 mg Oral QHS PRN White, Patrice L, NP       No current outpatient medications on file.    Labs  Lab Results:  Admission on 11/27/2023, Discharged on  11/27/2023  Component Date Value Ref Range Status   WBC 11/27/2023 3.9 (L)  4.0 - 10.5 K/uL Final   RBC 11/27/2023 4.28  4.22 - 5.81 MIL/uL Final   Hemoglobin 11/27/2023 13.8  13.0 - 17.0 g/dL Final   HCT 92/95/7974 39.5  39.0 - 52.0 % Final   MCV 11/27/2023 92.3  80.0 - 100.0 fL Final   MCH 11/27/2023 32.2  26.0 - 34.0 pg Final   MCHC 11/27/2023 34.9  30.0 - 36.0 g/dL Final   RDW 92/95/7974 13.6  11.5 - 15.5 % Final   Platelets 11/27/2023 169  150 - 400 K/uL Final   nRBC 11/27/2023 0.0  0.0 - 0.2 % Final   Neutrophils Relative % 11/27/2023 58  % Final   Neutro Abs 11/27/2023 2.3  1.7 - 7.7 K/uL Final   Lymphocytes Relative 11/27/2023 30  % Final   Lymphs Abs 11/27/2023 1.2  0.7 - 4.0 K/uL Final   Monocytes Relative 11/27/2023 9  % Final   Monocytes Absolute 11/27/2023 0.3  0.1 - 1.0 K/uL Final   Eosinophils Relative 11/27/2023 1  % Final   Eosinophils Absolute 11/27/2023 0.0  0.0 - 0.5 K/uL Final   Basophils Relative 11/27/2023 1  % Final   Basophils Absolute 11/27/2023 0.0  0.0 - 0.1 K/uL Final   Immature Granulocytes 11/27/2023 1  % Final   Abs Immature Granulocytes 11/27/2023 0.02  0.00 - 0.07 K/uL Final   Performed at [facility], [address], [place], [place] [address]   Sodium 11/27/2023 142  135 - 145 mmol/L Final   Potassium 11/27/2023 4.1  3.5 - 5.1 mmol/L Final   Chloride 11/27/2023 109  98 - 111 mmol/L Final   CO2 11/27/2023 21 (L)  22 - 32 mmol/L Final   Glucose, Bld 11/27/2023 79  70 - 99 mg/dL Final   Glucose reference range applies only to samples taken after fasting for at least 8 hours.   BUN 11/27/2023 5 (L)  6 - 20 mg/dL Final   Creatinine, Ser 11/27/2023 0.81  0.61 - 1.24 mg/dL Final   Calcium 92/95/7974 9.2  8.9 - 10.3 mg/dL Final   Total Protein 92/95/7974 7.0  6.5 - 8.1 g/dL Final   Albumin 92/95/7974 3.7  3.5 - 5.0 g/dL Final   AST 92/95/7974 29  15 -  41 U/L Final   ALT 11/27/2023 28  0 - 44 U/L Final   Alkaline Phosphatase 11/27/2023 74  38 -  126 U/L Final   Total Bilirubin 11/27/2023 0.4  0.0 - 1.2 mg/dL Final   GFR, Estimated 11/27/2023 >60  >60 mL/min Final   Comment: (NOTE) Calculated using the CKD-EPI Creatinine Equation (2021)    Anion gap 11/27/2023 12  5 - 15 Final   Performed at [facility], [address] [place], [place] [address]   Hgb A1c MFr Bld 11/27/2023 5.2  4.8 - 5.6 % Final   Comment: (NOTE) Diagnosis of Diabetes The following HbA1c ranges recommended by the American Diabetes Association (ADA) may be used as an aid in the diagnosis of diabetes mellitus.  Hemoglobin             Suggested A1C NGSP%              Diagnosis  <5.7                   Non Diabetic  5.7-6.4                Pre-Diabetic  >6.4                   Diabetic  <7.0                   Glycemic control for                       adults with diabetes.     Mean Plasma Glucose 11/27/2023 102.54  mg/dL Final   Performed at [facility], [address], [place], [place] [address]   Alcohol , Ethyl (B) 11/27/2023 265 (H)  <15 mg/dL Final   Comment: (NOTE) For medical purposes only. Performed at [facility], [address] [place], [place] [address]    Cholesterol 11/27/2023 160  0 - 200 mg/dL Final   Triglycerides 92/95/7974 99  <150 mg/dL Final   HDL 92/95/7974 73  >40 mg/dL Final   Total CHOL/HDL Ratio 11/27/2023 2.2  RATIO Final   VLDL 11/27/2023 20  0 - 40 mg/dL Final   LDL Cholesterol 11/27/2023 67  0 - 99 mg/dL Final   Comment:        Total Cholesterol/HDL:CHD Risk Coronary Heart Disease Risk Table                     Men   Women  1/2 Average Risk   3.4   3.3  Average Risk       5.0   4.4  2 X Average Risk   9.6   7.1  3 X Average Risk  23.4   11.0        Use the calculated Patient Ratio above and the CHD Risk Table to determine the patient's CHD Risk.        ATP III CLASSIFICATION (LDL):  <100     mg/dL   Optimal  899-870  mg/dL   Near or Above                    Optimal  130-159  mg/dL    Borderline  839-810  mg/dL   High  >809     mg/dL   Very High Performed at [facility], [address], [place], [place] [address]    TSH 11/27/2023 0.482  0.350 - 4.500 uIU/mL Final   Comment: Performed by a 3rd Generation assay with a functional sensitivity of <=0.01 uIU/mL. Performed at [facility], [address] [place], [place] [address]    RPR Ser Ql 11/27/2023 NON REACTIVE  NON REACTIVE Final   Performed at [facility], [address] [place], [place] [address]   POC Amphetamine UR 11/27/2023 None Detected  NONE DETECTED (Cut Off Level 1000 ng/mL) Final   POC Secobarbital (BAR) 11/27/2023 None Detected  NONE DETECTED (Cut Off Level 300 ng/mL) Final   POC Buprenorphine (BUP) 11/27/2023 None Detected  NONE DETECTED (Cut Off Level 10 ng/mL) Final   POC Oxazepam (BZO) 11/27/2023 None Detected  NONE DETECTED (Cut Off Level 300 ng/mL) Final   POC Cocaine UR 11/27/2023 Positive (A)  NONE DETECTED (Cut Off Level 300 ng/mL) Final   POC Methamphetamine UR 11/27/2023 None Detected  NONE DETECTED (Cut Off Level 1000 ng/mL) Final   POC Morphine  11/27/2023 None Detected  NONE DETECTED (Cut Off Level 300 ng/mL) Final   POC Methadone UR 11/27/2023 None Detected  NONE DETECTED (Cut Off Level 300 ng/mL) Final   POC Oxycodone  UR 11/27/2023 None Detected  NONE DETECTED (Cut Off Level 100 ng/mL) Final   POC Marijuana UR 11/27/2023 Positive (A)  NONE DETECTED (Cut Off Level 50 ng/mL) Final   HIV Screen 4th Generation wRfx 11/27/2023 Non Reactive  Non Reactive Final   Performed at [facility], [address], [place], [place] [address]    Blood Alcohol  level:  Lab Results  Component Value Date   ETH 265 (H) 11/27/2023   ETH 332 (HH) 06/15/2022    Metabolic Disorder Labs: Lab Results  Component Value Date   HGBA1C 5.2 11/27/2023   MPG 102.54 11/27/2023   MPG 91 04/20/2022   No results found for: PROLACTIN Lab Results  Component Value Date   CHOL 160  11/27/2023   TRIG 99 11/27/2023   HDL 73 11/27/2023   CHOLHDL 2.2 11/27/2023   VLDL 20 11/27/2023   LDLCALC 67 11/27/2023   LDLCALC 70 04/20/2022    Therapeutic Lab Levels: No results found for: LITHIUM No results found for: VALPROATE No results found for: CBMZ  Physical Findings   AIMS    Flowsheet Row [facility] (Discharged[facility]  AIMS Total Score 0   AUDIT    Flowsheet Row ED from 11/27/2023 in [facility] Admission (Discharged) from [facility] Ad[facility] (Discharged[facility]  Alcohol  Use Disorder Identification Test Final Score (AUDIT) 34 13 28   PHQ2-9    Flowsheet Row Office Visit from 07/21/2022 in [facility] ED from 04/20/2022 in [facility] ED from 03/19/2022 in [facility]  PHQ-2 Total Score 2 2 0  PHQ-9 Total Score 8 8 0   Flowsheet Row ED from 11/27/2023 in [facility] Most recent reading at 11/27/2023  3:32 PM ED from 11/27/2023 in [facility] Most recent reading at 11/27/2023  3:23 PM ED from 04/27/2023 in [facility] Emergency Department at [facility] Most recent reading at 04/27/2023  1:03 PM  C-SSRS RISK CATEGORY No Risk No Risk No Risk     Musculoskeletal  Strength &  Muscle Tone: within normal limits Gait & Station: normal Patient leans: N/A  Psychiatric Specialty Exam  Presentation  General Appearance:  Appropriate for Environment  Eye Contact: Fair  Speech: Clear and Coherent  Speech Volume: Normal  Handedness: Right   Mood and Affect  Mood: Depressed  Affect: Congruent   Thought Process  Thought Processes: Coherent  Descriptions of  Associations:Intact  Orientation:Full (Time, Place and Person)  Thought Content:Logical  Diagnosis of Schizophrenia or Schizoaffective disorder in past: No data recorded   Hallucinations:No data recorded Ideas of Reference:None  Suicidal Thoughts:No data recorded Homicidal Thoughts:No data recorded  Sensorium  Memory: Immediate Fair; Recent Fair; Remote Fair  Judgment: Fair  Insight: Fair   Art therapist  Concentration: Fair  Attention Span: Fair  Recall: [employer] of Knowledge: Fair  Language: Fair   Psychomotor Activity  Psychomotor Activity:No data recorded  Assets  Assets: Communication Skills; Desire for Improvement; Physical Health; Leisure Time   Sleep  Sleep:No data recorded Estimated Sleeping Duration (Last 24 Hours): 10.25-12.50 hours  No data recorded  Physical Exam  Physical exam:  General: Well developed, well nourished, [place] male Pupils: Normal at 3mm Respiratory: Breathing is unlabored.  Cardiovascular: No edema.  Language: No anomia, no aphasia Muscle strength and tone-pt moving all extremities.  Gait not assessed as pt remained in bed.  Neuro: Facial muscles are symmetric. Pt without tremor, no evidence of hyperarousal.  Review of Systems  Constitutional: Negative.   HENT: Negative.    Eyes: Negative.   Respiratory: Negative.    Cardiovascular: Negative.   Gastrointestinal: Negative.   Genitourinary: Negative.   Musculoskeletal: Negative.   Skin: Negative.   Neurological: Negative.   Endo/Heme/Allergies: Negative.   Psychiatric/Behavioral:  Positive for depression and substance abuse. The patient is nervous/anxious.      Blood pressure (!) 139/94, pulse 77, temperature 97.6 F (36.4 C), temperature source Oral, resp. rate 16, SpO2 100%. There is no height or weight on file to calculate BMI.  Treatment Plan Summary: 38 y.o. 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    7/5:   He states that in the past when withdrawing from alcohol  he usually experiences shakes and sweats and denies a history of alcohol  withdrawal seizures or delirium tremens. He reports smoking marijuana since he was 38 years old, on average he smokes every other day and states that one blunt will last him for 2 days. He denies using other illicit drugs. However, UDS positive for cocaine and THC. He states that he has received substance abuse treatment here ([facility]) 3 different times. He denies other inpatient psychiatric hospitalizations or residential treatment.  Denies SI or HI. Agreeable to naltrexone  for alcohol  cessation. Presentation most consistent with substance induced mood disorder. Patient will consider antidepressant. Denies prior psychiatric history.  7/6: patient tolerating medications well. Declines antidepressant stating he has only felt depressed the past week in context of substance abuse. Denies SI, HI or AVH. Patient is future oriented on going to inpatient rehab.    #substance induced depression - patient will consider starting Lexapro  or Zoloft but states he has not had period of sobriety and declines stating depression is substance induced   #alcohol  use disorder -continue Ativan  taper + CIWA with PRN Ativan  -cont naltrexone  50 mg qdaily after discussing  r/b/a   #cannabis dependence - [facility] referral    Jeremie Abdelaziz, MD 11/29/2023 2:08 PM

## 2023-11-29 NOTE — ED Notes (Signed)
 CIWA assessment not done as patient was asleep

## 2023-11-29 NOTE — ED Notes (Signed)
 Pt currently watching TV in day room. Pt reports that last dose of ativan  decreased previous irritability from not being able to have a cigarette. Pt attended and participated in RN education group held earlier tonight. Pt gets along well with other pts and staff.

## 2023-11-29 NOTE — ED Notes (Signed)
 Pt affirms to have eaten lunch and snack. Pt denies complaints or needs at this time. Pt currently watching TV in dayroom. Pt gets along well with staff and other pts. Civw -0, bp 139/94- provider notified.

## 2023-11-30 ENCOUNTER — Encounter (HOSPITAL_COMMUNITY): Payer: Self-pay | Admitting: Behavioral Health

## 2023-11-30 DIAGNOSIS — F10230 Alcohol dependence with withdrawal, uncomplicated: Secondary | ICD-10-CM | POA: Diagnosis not present

## 2023-11-30 DIAGNOSIS — F32A Depression, unspecified: Secondary | ICD-10-CM | POA: Diagnosis not present

## 2023-11-30 DIAGNOSIS — F17200 Nicotine dependence, unspecified, uncomplicated: Secondary | ICD-10-CM | POA: Diagnosis not present

## 2023-11-30 DIAGNOSIS — F1220 Cannabis dependence, uncomplicated: Secondary | ICD-10-CM | POA: Diagnosis not present

## 2023-11-30 LAB — GC/CHLAMYDIA PROBE AMP (~~LOC~~) NOT AT ARMC
Chlamydia: NEGATIVE
Comment: NEGATIVE
Comment: NORMAL
Neisseria Gonorrhea: NEGATIVE

## 2023-11-30 MED ORDER — NICOTINE POLACRILEX 2 MG MT GUM
2.0000 mg | CHEWING_GUM | OROMUCOSAL | Status: DC | PRN
  Administered 2023-11-30: 2 mg via ORAL
  Filled 2023-11-30: qty 1

## 2023-11-30 MED ORDER — BUPROPION HCL ER (SR) 150 MG PO TB12
150.0000 mg | ORAL_TABLET | Freq: Every day | ORAL | Status: DC
  Administered 2023-11-30 – 2023-12-01 (×2): 150 mg via ORAL
  Filled 2023-11-30 (×2): qty 1

## 2023-11-30 MED ORDER — NICOTINE POLACRILEX 2 MG MT GUM
2.0000 mg | CHEWING_GUM | Freq: Once | OROMUCOSAL | Status: AC
  Administered 2023-11-30: 2 mg via ORAL
  Filled 2023-11-30: qty 1

## 2023-11-30 NOTE — BH Assessment (Signed)
 LCSW met with patient to assess current mood, affect, physical state, and inquire about needs/goals while here in [facility] and after discharge. Patient reports he presented to [facility] for alcohol  detox and had been here before but reported he left early both times. Patient reported drinking 4 40 ounces of beer daily and stated he did cocaine one time before admission but stated he had not done but one other time several years ago. 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   Patient reports having good supports of his family and girlfriend and stated he is seeking a residential treatment program. He denies ever being at one before and is having a hard time adjusting to not smoking. SW discussed nicotine  patch and gum and he is agreeable. SW explained that most of the residential programs do not allow for smoking.   Patient denies any prior history of inpatient substance abuse treatment but did participate in [facility] upstairs for 5 sessions but did not complete it because most of the other patients graduated out and he felt he could no longer benefit. Patient currently denies any SI/HI/AVH.   Patient aware that LCSW will send referrals out for review and will follow up to provide updates as received. Patient expressed understanding and appreciation of LCSW assistance. No other needs were reported at this time by patient.

## 2023-11-30 NOTE — ED Notes (Signed)
 Pt awake at shift change. Denies feelings of withdrawal. Denies si/hi/avh. Cooperative and calm on approach. Appetite good. Appears in scrubs. Pt focus is for patient rehab and offers no other concerns. Behavior is controlled. Safety maintained.

## 2023-11-30 NOTE — ED Notes (Signed)
 Patient is sleeping. Respirations equal and unlabored. No change in assessment or acuity. Routine safety checks conducted according to facility protocol.

## 2023-11-30 NOTE — ED Provider Notes (Signed)
 Behavioral Health Progress Note  Date and Time: 11/30/2023 4:01 PM Name: Howard Adams MRN:  [id number]  Subjective:    Patient reports feeling depressed and craving to smoke. Declines nicotine  patch but agreeable to trial of Wellbutrin  as well as nicotine  gum. Patient reports fair sleep and appetite. Denies SI or HI. Denies AVH. States he feels a little better. Patient is tolerating Ativan  taper and naltrexone  well and denies side effects. Denies wanting to be dead.  Diagnosis:  Final diagnoses:  Alcohol  dependence with uncomplicated withdrawal (HCC)  Cannabis dependence (HCC)  Substance induced mood disorder (HCC)    Total Time spent with patient: 20 minutes  Past Psychiatric History:  Psychiatric diagnoses: alcohol  dependence, substance induced mood disorder Suicide attempts :denies Homicide: denies Admissions: three prior admissions to [facility] otherwise denies  Past Medical History: denies Family History: denies and denies history of suicide attempts Social History: kicked out by girlfriend with strained relationship, staying with grandmother  Additional Social History:                         Sleep: Fair  Appetite:  Fair  Current Medications:  Current Facility-Administered Medications  Medication Dose Route Frequency Provider Last Rate Last Admin   acetaminophen  (TYLENOL ) tablet 650 mg  650 mg Oral Q6H PRN White, Patrice L, NP       alum & mag hydroxide-simeth (MAALOX/MYLANTA) 200-200-20 MG/5ML suspension 30 mL  30 mL Oral Q4H PRN White, Patrice L, NP       buPROPion  (WELLBUTRIN  SR) 12 hr tablet 150 mg  150 mg Oral Daily Tayshun Gappa, MD   150 mg at 11/30/23 1338   haloperidol  (HALDOL ) tablet 5 mg  5 mg Oral TID PRN White, Patrice L, NP       And   diphenhydrAMINE  (BENADRYL ) capsule 50 mg  50 mg Oral TID PRN White, Patrice L, NP       haloperidol  lactate (HALDOL ) injection 5 mg  5 mg Intramuscular TID PRN White, Patrice L, NP       And   diphenhydrAMINE   (BENADRYL ) injection 50 mg  50 mg Intramuscular TID PRN White, Patrice L, NP       And   LORazepam  (ATIVAN ) injection 2 mg  2 mg Intramuscular TID PRN White, Patrice L, NP       haloperidol  lactate (HALDOL ) injection 10 mg  10 mg Intramuscular TID PRN White, Patrice L, NP       And   diphenhydrAMINE  (BENADRYL ) injection 50 mg  50 mg Intramuscular TID PRN White, Patrice L, NP       And   LORazepam  (ATIVAN ) injection 2 mg  2 mg Intramuscular TID PRN White, Patrice L, NP       hydrOXYzine  (ATARAX ) tablet 25 mg  25 mg Oral TID PRN White, Patrice L, NP   25 mg at 11/28/23 2126   LORazepam  (ATIVAN ) tablet 1 mg  1 mg Oral BID White, Patrice L, NP   1 mg at 11/30/23 0940   Followed by   NOREEN ON 12/01/2023] LORazepam  (ATIVAN ) tablet 1 mg  1 mg Oral Daily White, Patrice L, NP       magnesium  hydroxide (MILK OF MAGNESIA) suspension 30 mL  30 mL Oral Daily PRN White, Patrice L, NP       multivitamin with minerals tablet 1 tablet  1 tablet Oral Daily White, Patrice L, NP   1 tablet at 11/30/23 0942   naltrexone  (DEPADE) tablet 50  mg  50 mg Oral Daily Tibor Lemmons, MD   50 mg at 11/30/23 9057   thiamine  (VITAMIN B1) injection 100 mg  100 mg Intramuscular Once White, Patrice L, NP       thiamine  (VITAMIN B1) tablet 100 mg  100 mg Oral Daily White, Patrice L, NP   100 mg at 11/30/23 9057   traZODone  (DESYREL ) tablet 50 mg  50 mg Oral QHS PRN White, Patrice L, NP       No current outpatient medications on file.    Labs  Lab Results:  Admission on 11/27/2023, Discharged on 11/27/2023  Component Date Value Ref Range Status   WBC 11/27/2023 3.9 (L)  4.0 - 10.5 K/uL Final   RBC 11/27/2023 4.28  4.22 - 5.81 MIL/uL Final   Hemoglobin 11/27/2023 13.8  13.0 - 17.0 g/dL Final   HCT 92/95/7974 39.5  39.0 - 52.0 % Final   MCV 11/27/2023 92.3  80.0 - 100.0 fL Final   MCH 11/27/2023 32.2  26.0 - 34.0 pg Final   MCHC 11/27/2023 34.9  30.0 - 36.0 g/dL Final   RDW 92/95/7974 13.6  11.5 - 15.5 % Final   Platelets  11/27/2023 169  150 - 400 K/uL Final   nRBC 11/27/2023 0.0  0.0 - 0.2 % Final   Neutrophils Relative % 11/27/2023 58  % Final   Neutro Abs 11/27/2023 2.3  1.7 - 7.7 K/uL Final   Lymphocytes Relative 11/27/2023 30  % Final   Lymphs Abs 11/27/2023 1.2  0.7 - 4.0 K/uL Final   Monocytes Relative 11/27/2023 9  % Final   Monocytes Absolute 11/27/2023 0.3  0.1 - 1.0 K/uL Final   Eosinophils Relative 11/27/2023 1  % Final   Eosinophils Absolute 11/27/2023 0.0  0.0 - 0.5 K/uL Final   Basophils Relative 11/27/2023 1  % Final   Basophils Absolute 11/27/2023 0.0  0.0 - 0.1 K/uL Final   Immature Granulocytes 11/27/2023 1  % Final   Abs Immature Granulocytes 11/27/2023 0.02  0.00 - 0.07 K/uL Final   Performed at [facility], [address] [place], [place] [address]   Sodium 11/27/2023 142  135 - 145 mmol/L Final   Potassium 11/27/2023 4.1  3.5 - 5.1 mmol/L Final   Chloride 11/27/2023 109  98 - 111 mmol/L Final   CO2 11/27/2023 21 (L)  22 - 32 mmol/L Final   Glucose, Bld 11/27/2023 79  70 - 99 mg/dL Final   Glucose reference range applies only to samples taken after fasting for at least 8 hours.   BUN 11/27/2023 5 (L)  6 - 20 mg/dL Final   Creatinine, Ser 11/27/2023 0.81  0.61 - 1.24 mg/dL Final   Calcium 92/95/7974 9.2  8.9 - 10.3 mg/dL Final   Total Protein 92/95/7974 7.0  6.5 - 8.1 g/dL Final   Albumin 92/95/7974 3.7  3.5 - 5.0 g/dL Final   AST 92/95/7974 29  15 - 41 U/L Final   ALT 11/27/2023 28  0 - 44 U/L Final   Alkaline Phosphatase 11/27/2023 74  38 - 126 U/L Final   Total Bilirubin 11/27/2023 0.4  0.0 - 1.2 mg/dL Final   GFR, Estimated 11/27/2023 >60  >60 mL/min Final   Comment: (NOTE) Calculated using the CKD-EPI Creatinine Equation (2021)    Anion gap 11/27/2023 12  5 - 15 Final   Performed at [facility], [address], [place], [place] [address]   Hgb A1c MFr Bld 11/27/2023 5.2  4.8 - 5.6 % Final   Comment: (NOTE) Diagnosis of Diabetes The following HbA1c ranges  recommended by the American Diabetes Association (ADA) may be used as an aid in the diagnosis of diabetes mellitus.  Hemoglobin             Suggested A1C NGSP%              Diagnosis  <5.7                   Non Diabetic  5.7-6.4                Pre-Diabetic  >6.4                   Diabetic  <7.0                   Glycemic control for                       adults with diabetes.     Mean Plasma Glucose 11/27/2023 102.54  mg/dL Final   Performed at [facility], [address], [place], [place] [address]   Alcohol , Ethyl (B) 11/27/2023 265 (H)  <15 mg/dL Final   Comment: (NOTE) For medical purposes only. Performed at [facility], [address] [place], [place] [address]    Cholesterol 11/27/2023 160  0 - 200 mg/dL Final   Triglycerides 92/95/7974 99  <150 mg/dL Final   HDL 92/95/7974 73  >40 mg/dL Final   Total CHOL/HDL Ratio 11/27/2023 2.2  RATIO Final   VLDL 11/27/2023 20  0 - 40 mg/dL Final   LDL Cholesterol 11/27/2023 67  0 - 99 mg/dL Final   Comment:        Total Cholesterol/HDL:CHD Risk Coronary Heart Disease Risk Table                     Men   Women  1/2 Average Risk   3.4   3.3  Average Risk       5.0   4.4  2 X Average Risk   9.6   7.1  3 X Average Risk  23.4   11.0        Use the calculated Patient Ratio above and the CHD Risk Table to determine the patient's CHD Risk.        ATP III CLASSIFICATION (LDL):  <100     mg/dL   Optimal  899-870  mg/dL   Near or Above                    Optimal  130-159  mg/dL   Borderline  839-810  mg/dL   High  >809     mg/dL   Very High Performed at [facility], [address] [place], [place] [address]    TSH 11/27/2023 0.482  0.350 - 4.500 uIU/mL Final   Comment: Performed by a 3rd Generation assay with a functional sensitivity of <=0.01 uIU/mL. Performed at [facility], [address], [place], [place] [address]    RPR Ser Ql 11/27/2023 NON REACTIVE  NON REACTIVE Final   Performed at [facility], [address] [place], [place] [address]   POC Amphetamine UR 11/27/2023 None Detected  NONE DETECTED (Cut Off Level 1000 ng/mL) Final   POC Secobarbital (BAR) 11/27/2023 None Detected  NONE DETECTED (Cut Off Level 300 ng/mL)  Final   POC Buprenorphine (BUP) 11/27/2023 None Detected  NONE DETECTED (Cut Off Level 10 ng/mL) Final   POC Oxazepam (BZO) 11/27/2023 None Detected  NONE DETECTED (Cut Off Level 300 ng/mL) Final   POC Cocaine UR 11/27/2023 Positive (A)  NONE DETECTED (Cut Off Level 300 ng/mL) Final   POC Methamphetamine UR 11/27/2023 None Detected  NONE DETECTED (Cut Off Level 1000 ng/mL) Final   POC Morphine  11/27/2023 None Detected  NONE DETECTED (Cut Off Level 300 ng/mL) Final   POC Methadone UR 11/27/2023 None Detected  NONE DETECTED (Cut Off Level 300 ng/mL) Final   POC Oxycodone  UR 11/27/2023 None Detected  NONE DETECTED (Cut Off Level 100 ng/mL) Final   POC Marijuana UR 11/27/2023 Positive (A)  NONE DETECTED (Cut Off Level 50 ng/mL) Final   HIV Screen 4th Generation wRfx 11/27/2023 Non Reactive  Non Reactive Final   Performed at [facility], [address] [place], [place] [address]    Blood Alcohol  level:  Lab Results  Component Value Date   ETH 265 (H) 11/27/2023   ETH 332 (HH) 06/15/2022    Metabolic Disorder Labs: Lab Results  Component Value Date   HGBA1C 5.2 11/27/2023   MPG 102.54 11/27/2023   MPG 91 04/20/2022   No results found for: PROLACTIN Lab Results  Component Value Date   CHOL 160 11/27/2023   TRIG 99 11/27/2023   HDL 73 11/27/2023   CHOLHDL 2.2 11/27/2023   VLDL 20 11/27/2023   LDLCALC 67 11/27/2023   LDLCALC 70 04/20/2022    Therapeutic Lab Levels: No results found for: LITHIUM No results found for: VALPROATE No results found for: CBMZ  Physical Findings   AIMS    Flowsheet Row Ad[facility] (Discharged[facility]  AIMS Total Score 0    AUDIT    Flowsheet Row ED from 11/27/2023 in [facility] Admission (Discharged) from [facility] Ad[facility] (Discharged[facility]  Alcohol  Use Disorder Identification Test Final Score (AUDIT) 34 13 28   PHQ2-9    Flowsheet Row Office Visit from 07/21/2022 [facility] ED from 04/20/2022 in [facility] ED from 03/19/2022 in [facility]  PHQ-2 Total Score 2 2 0  PHQ-9 Total Score 8 8 0   Flowsheet Row ED from 11/27/2023 in [facility] Most recent reading at 11/27/2023  3:32 PM ED from 11/27/2023 in [facility] Most recent reading at 11/27/2023  3:23 PM ED from 04/27/2023 in [facility] Emergency Department at [facility] Most recent reading at 04/27/2023  1:03 PM  C-SSRS RISK CATEGORY No Risk No Risk No Risk     Musculoskeletal  Strength & Muscle Tone: within normal limits Gait & Station: normal Patient leans: N/A  Psychiatric Specialty Exam  Presentation  General Appearance:  Appropriate for Environment  Eye Contact: Fair  Speech: Clear and Coherent  Speech Volume: Normal  Handedness: Right   Mood and Affect  Mood: Depressed  Affect: Congruent   Thought Process  Thought Processes: Coherent  Descriptions of Associations:Intact  Orientation:Full (Time, Place and Person)  Thought Content:Logical  Diagnosis of Schizophrenia or Schizoaffective disorder in past: No data recorded   Hallucinations: denies AVH Ideas of Reference:None  Suicidal Thoughts:denies Homicidal Thoughts:denies  Sensorium  Memory: Immediate Fair; Recent Fair; Remote Fair  Judgment: Fair  Insight: Fair   Chartered certified accountant: Fair  Attention  Span: Fair  Recall: [employer] of Knowledge: Fair  Language: Fair   Psychomotor Activity  Psychomotor Activity:No data recorded  Assets  Assets: Communication Skills; Desire for Improvement; Physical Health; Leisure Time   Sleep  Sleep:No data recorded Estimated Sleeping Duration (Last 24 Hours): 8.25-9.75 hours  No data recorded  Physical Exam  Physical exam:  General: Well developed, well nourished, [place] male Pupils: Normal at 3mm Respiratory: Breathing is unlabored.  Cardiovascular: No edema.  Language: No anomia, no aphasia Muscle strength and tone-pt moving all extremities.  Gait not assessed as pt remained in bed.  Neuro: Facial muscles are symmetric. Pt without tremor, no evidence of hyperarousal.  Review of Systems  Constitutional: Negative.   HENT: Negative.    Eyes: Negative.   Respiratory: Negative.    Cardiovascular: Negative.   Gastrointestinal: Negative.   Genitourinary: Negative.   Musculoskeletal: Negative.   Skin: Negative.   Neurological: Negative.   Endo/Heme/Allergies: Negative.   Psychiatric/Behavioral:  Positive for depression and substance abuse. The patient is nervous/anxious.      Blood pressure 128/88, pulse 73, temperature 97.6 F (36.4 C), temperature source Temporal, resp. rate 16, SpO2 98%. There is no height or weight on file to calculate BMI.  Treatment Plan Summary: 38 y.o. 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    7/5:   He states that in the past when withdrawing from alcohol  he usually experiences shakes and sweats and denies a history of alcohol  withdrawal seizures or delirium tremens. He reports smoking marijuana since he was 38 years old, on average he  smokes every other day and states that one blunt will last him for 2 days. He denies using other illicit drugs. However, UDS positive for cocaine and THC. He states that he has received substance abuse treatment here ([facility]) 3 different times. He denies other inpatient psychiatric hospitalizations or residential treatment.  Denies SI or HI. Agreeable to naltrexone  for alcohol  cessation. Presentation most consistent with substance induced mood disorder. Patient will consider antidepressant. Denies prior psychiatric history.  7/6: patient tolerating medications well. Declines antidepressant stating he has only felt depressed the past week in context of substance abuse. Denies SI, HI or AVH. Patient is future oriented on going to inpatient rehab.  7/7: patient reports depressed mood and nicotine  dependence. Would like to try Wellbutrin  after discussing r/b/a. Denies SI or HI. Denies AVH. Patient is future oriented on going to inpatient rehab.    #substance induced depression - patient will consider starting Lexapro  or Zoloft but states he has not had period of sobriety and declines stating depression is substance induced -Starting Wellbutrin  SR 150 mg qdaily    #alcohol  use disorder -continue Ativan  taper + CIWA with PRN Ativan  -cont naltrexone  50 mg qdaily after discussing r/b/a  #nicotine  dependence -declines nicotine  patch -nicotine  gum q2h-prn Starting Wellbutrin  SR 150 mg qdaily   #cannabis dependence - [facility] referral    Marrisa Kimber, MD 11/30/2023 4:01 PM

## 2023-11-30 NOTE — Group Note (Signed)
 Group Topic: Positive Affirmations  Group Date: 11/30/2023 Start Time: 0900 End Time: 0945 Facilitators: Tawnya Cloyd SAUNDERS, RN  Department: [facility]    Name: Howard Adams Date of Birth: 01-11-1986  MR: [id number]    cooperative Interactions with others: gave feedback Mood/Affect: appropriate Triggers (if applicable): none reported Cognition: goal directed Progress: Gaining insight Response: attentive and receptive Plan: follow-up needed  Patients Problems:  Patient Active Problem List   Diagnosis Date Noted   Alcohol  use with alcohol -induced mood disorder (HCC) 11/27/2023   Tobacco abuse counseling 07/21/2022   Chronic dental pain 07/21/2022   Liver lesion 07/21/2022   Elevated LFTs 07/21/2022   Marijuana abuse 07/21/2022   Substance induced mood disorder (HCC) 04/21/2022   Alcohol  abuse 03/19/2022   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

## 2023-11-30 NOTE — ED Notes (Signed)
 Pt is in the dayroom watching TV with peers. Pt denies SI/HI/AVH. Pt reports cravings for smoking. Pt has no further complain.No acute distress noted.

## 2023-12-01 DIAGNOSIS — F1220 Cannabis dependence, uncomplicated: Secondary | ICD-10-CM | POA: Diagnosis not present

## 2023-12-01 DIAGNOSIS — F17200 Nicotine dependence, unspecified, uncomplicated: Secondary | ICD-10-CM | POA: Diagnosis not present

## 2023-12-01 DIAGNOSIS — F32A Depression, unspecified: Secondary | ICD-10-CM | POA: Diagnosis not present

## 2023-12-01 DIAGNOSIS — F10230 Alcohol dependence with withdrawal, uncomplicated: Secondary | ICD-10-CM | POA: Diagnosis not present

## 2023-12-01 MED ORDER — NALTREXONE HCL 50 MG PO TABS
50.0000 mg | ORAL_TABLET | Freq: Every day | ORAL | 0 refills | Status: AC
Start: 2023-12-02 — End: ?

## 2023-12-01 MED ORDER — ADULT MULTIVITAMIN W/MINERALS CH
1.0000 | ORAL_TABLET | Freq: Every day | ORAL | Status: AC
Start: 2023-12-02 — End: ?

## 2023-12-01 MED ORDER — NICOTINE POLACRILEX 2 MG MT GUM
2.0000 mg | CHEWING_GUM | OROMUCOSAL | 0 refills | Status: AC | PRN
Start: 2023-12-01 — End: ?

## 2023-12-01 MED ORDER — TRAZODONE HCL 50 MG PO TABS: 50.0000 mg | ORAL_TABLET | Freq: Every evening | ORAL | 0 refills | Status: AC | PRN

## 2023-12-01 MED ORDER — BUPROPION HCL ER (SR) 150 MG PO TB12: 150.0000 mg | ORAL_TABLET | Freq: Every day | ORAL | 0 refills | Status: AC

## 2023-12-01 NOTE — ED Notes (Signed)
 Patient is sleeping. Respirations equal and unlabored. No change in assessment or acuity. Routine safety checks conducted according to facility protocol.

## 2023-12-01 NOTE — ED Notes (Signed)
 Discharge paperwork including medications and AVS was explained to pt, pt affirmed understanding and denied questions. Pt was provided with bus ticket. Pt was escorted off unit by staff, items returned.

## 2023-12-01 NOTE — ED Notes (Signed)
 Patient A&Ox4. Denies intent to harm self/others when asked. Denies A/VH. Patient denies any physical complaints when asked. No acute distress noted. Support and encouragement provided. Routine safety checks conducted according to facility protocol. Encouraged patient to notify staff if thoughts of harm toward self or others arise. Patient verbalize understanding and agreement. Will continue to monitor for safety.

## 2023-12-01 NOTE — Discharge Planning (Addendum)
 Patient stated he wanted to DC today and would arrive at [facility] on a walk in basis. Instructions were provided in his AVS. He will be provided 30 day scripts for medications for his admission.  SW was unable to get return call from [place] [facility] to verify his approval and patient was made aware. SW was able to reach [place] and she doesn't have any openings available until Thursday and that she does not have any walk in's available at this time and the earliest patient would be able to do a walk in was on Monday morning before 9 and would need to have his scripts filled and 28 days worth when he comes for admission on Monday. SW provided instructions in his AVS and discussed with him at length with verbalized understanding and agreement.

## 2023-12-01 NOTE — Discharge Planning (Signed)
 SW made call and spoke with [place] at [facility] and stated patient could call in for prescreen. SW provided him with the number and is having trouble reaching them but will continue to call.  SW also sent referral to [facility] and spoke with Rosaline and she is in process of review.  [facility] is also reviewing patients clinicals. Will continue to follow.

## 2023-12-01 NOTE — Discharge Instructions (Addendum)
 You may arrive as a walk in for the residential treatment program.   You will need to bring:  Proof of [facility] Residency Photo ID or Social Security Card 30-Day supply of all medications + Refills  [facility] [address] [place], [place] [address] Admin Hours: Mon-Fri 8AM to [facility] Hours: 24/7  Phone: [phone] Fax: [phone]   OBS Care Management   Base on the information you have provided and the presenting issue, outpatient services and resources for have been recommended.  It is imperative that you follow through with treatment recommendations within 5-7 days from the of discharge to mitigate further risk to your safety and mental well-being. A list of referrals has been provided below to get you started.  You are not limited to the list provided.  In case of an urgent crisis, you may contact the Mobile Crisis Unit with [facility] at [phone].      Residential Treatment  Facilities Medicaid Detox No Insurance Engineer, site ([facility]) [address] [place], [place] [phone] or [phone]   No  Yes  Yes  Yes   [facility] [id number] [place], [place] [address] [phone] Admissions: 8am-3pm  M-F   [place] only  No  Yes  No    [facility] [phone]   No  Yes  No- out of pocket 16,000  Yes   [facility] ([facility]) [address] [place], [place] [phone]   Yes- No medicare  Yes   Yes, [place]   [address] only    [address] [place], [place], [address] [phone]   No  No  Yes but private pay, offers some sponsorships  Does not take insurance    [facility] [place], [place] [phone]       No  No  Yes- [place] out of pocket if not in those counties. 3,220.00 for 28 days.   No    Residential Treatment  Facilities   Medicaid  Detox  No IT trainer (multiple locations throughout the country)  Intake: [phone]    No   Yes   No   Yes   [facility]  [facility] [place], [place] [phone] (takes everyone as long as they meet detox criteria)   Yes  Yes   Yes  Yes   [address] [place], [place]  [phone] 27 locations    No  No- sober living house  90.00-130.00 per week per person  [facility] Part of [place] Outreach [phone] [email or web address] [facility] Part of [place] Outreach [phone] [email or web address]    No   [facility] [address]  [place] [phone] [email or web address]     No  No  1,200.00 a 200.00 deposit is required at start of treatment Payment plans accepted [place] based program  No   Residential Treatment  Facilities   Medicaid  Detox  No [facility] [address]  [place], [place], [address] [phone]  No  Yes       Yes  Regular Rehab: 7,500.00 28 days Dual Diagnosis: 8,900.00 28 days 7 day detox: 2.700.00 or 3,400.00 for Dual Diagnosis   Yes   Outpatient Treatment  Facilities Medicaid Detox No Insurance Private  Insurance   [facility] [address]  [place], [place], [address] [phone]   No  No  No  Yes    [facility]  (If substance abuse is secondary diagnosis) [address]  [place], [place] [address] [phone]   Yes-[place] and [place] Only for Partial   No   No   Yes- IOP  [facility]  [address] [place] [place], [place] [phone] (offers adult AND adolescent Intensive Outpatient services) (also in [place], [place], [place] and [place])      No No  IOP- does some sponsorships on individual basis Yes   Outpatient Treatment  Facilities Medicaid Detox No [facility] [address]  [place], [place], [address] [phone]   Yes  They would go to ER at [facility] then be transferred to a detox unit    Yes- self pay     Yes    [facility] [address]  [place], [place], [address] And  [address]  [address],  [place], [place] [address] [phone]   Yes  No    Yes most qualify for state funding IOP and Opiod treatment- Offers Methadone  UHC, [place], [place]   [facility]  [phone]   No  Yes (in residential treatment program)   No   Insurance Only    [facility] [address] [place], [place],  [phone]   Yes but not for suboxone treatment  Yes- opiates with suboxone have to commit to 8 week IOP   Yes 595.00 for first visit  150.00 for prescription 150.00 a week after that for group    Yes   [facility] [address]  [place], [place] [phone]  No- has a waiting list about to be approved No Yes- but has to be self pay  500.00 for 1st 2 weeks  500 for next 2 weeks and  750 mo. Ongoing Yes   Outpatient Treatment  Facilities Medicaid Detox No Insurance [facility]   [facility] [phone] [place], [place] [phone]  No No Limited sponsorships IOP- 9,500.00 8-15 weeks If paid upfront gives a 500.00 deduction Outpatient- 1 day a week  9 weeks 4,500.00 has payment plans   Yes- out of network though   [facility] (Groups/Residential) [place], [place]  [phone]  Yes- [place] No IOP facility  Yes  No   [facility]  [address] [place] [place], [place] [phone]  No No Out of pocket only- depends on the situation  Allow people to do services on a flexible  payment plan- billed every 90 days based on income  35.00 per  group- 2 hour session  *DUI assessments  and *education for charges  *evaluations   No  Outpatient Treatment  Facilities Medicaid Detox No Insurance Private  Insurance   [facility]  [address]  [place], [place], [address] [phone]   Yes  No  Yes  Yes    Mobile Crisis: [facility]: [phone]  (For crisis response 24 hours a day) [facility] Hotline: [phone]

## 2023-12-01 NOTE — ED Notes (Signed)
 Pt sleeping in no acute distress. RR even and unlabored. Environment secured. Will continue to monitor for safety.

## 2023-12-01 NOTE — ED Provider Notes (Signed)
 FBC/OBS ASAP Discharge Summary  Date and Time: 12/01/2023 3:35 PM  Name: Howard Adams  MRN:  [id number]   Discharge Diagnoses:  Final diagnoses:  Alcohol  dependence with uncomplicated withdrawal (HCC)  Cannabis dependence (HCC)  Substance induced mood disorder (HCC)    Subjective: see progress note from today. Patient denies SI, HI or AVH. Patient was waiting on alternative referral for [facility] however already has referral for [facility] in 2 weeks. Does not wish to wait any longer and asking to be discharge today. Patient is future oriented and without any symptoms of withdrawal.  Stay Summary:   Patient was admitted to inpatient psychiatry at [facility] for safety and stabilization. Patient was provided safe and therapeutic milieu, psychiatric and medical assessment, care and treatment, as well as support from nursing, behavioral health staff. Both psychotherapy and psychoeducation groups were provided. Different coping skills such as journaling, CBT and art therapy groups were offered. Additional consultation was provided by hospitalist for H&P and medical needs.  Patient was started on Ativan  taper and naltrexone  during the admission for alcohol  use disorder. Patient was started on Wellbutrin  for depression and nicotine  dependence with good effect. Patient tolerated without side effects and medications were titrated to therapeutic effect. As patient stabilized on medications and participated in therapeutic interventions, symptoms began to improve. Denied SI, HI or AVH throughout admission.  On the day of discharge, the chart was reviewed, case was discussed with staff and the patient was seen in person. Patient's overall mood has improved. Patient was calm and cooperative and did not appear anxious. Patient reported adequate sleep and stable mood. Patient was tolerating medications well without side effects reported or noted. Patient denied suicidal ideation, plan or intent, denied hopelessness,  helplessness or worthlessness, and denied homicidal ideation. Insight and judgement have improved. Patient demonstrated future orientation and was motivated to follow-up with aftercare. Patient was encouraged to be adherent with medications. Patient was instructed to call 911, ask for help to go to the closest emergency room or crisis center, call crisis hotlines for help if in critical status or when symptoms were worsening. Patient voiced understanding of this information. At the time of discharge, patient had reached maximum benefit from hospitalization, was no longer considered to be dangerous to self or others, and was psychiatrically stable and otherwise appropriate for discharge to less restrictive care in the community.   Medical Hospital Course: Patient was seen by the hospitalist for routine admission examination. Medications for chronic conditions were continued.  Medical hospital course was otherwise unremarkable.    Total Time spent with patient: 30 minutes   Past Psychiatric History:  Psychiatric diagnoses: alcohol  dependence, substance induced mood disorder Suicide attempts :denies Homicide: denies Admissions: [facility] admissions [facility] otherwise denies  Past Medical History: denies Family History: denies and denies history of suicide attempts Social History: kicked out by girlfriend with strained relationship, staying with grandmother  Tobacco Cessation:  A prescription for an FDA-approved tobacco cessation medication provided at discharge  Current Medications:  Current Facility-Administered Medications  Medication Dose Route Frequency Provider Last Rate Last Admin   acetaminophen  (TYLENOL ) tablet 650 mg  650 mg Oral Q6H PRN White, Patrice L, NP       alum & mag hydroxide-simeth (MAALOX/MYLANTA) 200-200-20 MG/5ML suspension 30 mL  30 mL Oral Q4H PRN White, Patrice L, NP       buPROPion  (WELLBUTRIN  SR) 12 hr tablet 150 mg  150 mg Oral Daily Shelagh Rayman, MD   150 mg at  12/01/23 0951   haloperidol  (HALDOL ) tablet 5 mg  5 mg Oral TID PRN White, Patrice L, NP       And   diphenhydrAMINE  (BENADRYL ) capsule 50 mg  50 mg Oral TID PRN White, Patrice L, NP       haloperidol  lactate (HALDOL ) injection 5 mg  5 mg Intramuscular TID PRN White, Patrice L, NP       And   diphenhydrAMINE  (BENADRYL ) injection 50 mg  50 mg Intramuscular TID PRN White, Patrice L, NP       And   LORazepam  (ATIVAN ) injection 2 mg  2 mg Intramuscular TID PRN White, Patrice L, NP       haloperidol  lactate (HALDOL ) injection 10 mg  10 mg Intramuscular TID PRN White, Patrice L, NP       And   diphenhydrAMINE  (BENADRYL ) injection 50 mg  50 mg Intramuscular TID PRN White, Patrice L, NP       And   LORazepam  (ATIVAN ) injection 2 mg  2 mg Intramuscular TID PRN White, Patrice L, NP       hydrOXYzine  (ATARAX ) tablet 25 mg  25 mg Oral TID PRN White, Patrice L, NP   25 mg at 11/28/23 2126   magnesium  hydroxide (MILK OF MAGNESIA) suspension 30 mL  30 mL Oral Daily PRN White, Patrice L, NP       multivitamin with minerals tablet 1 tablet  1 tablet Oral Daily White, Patrice L, NP   1 tablet at 12/01/23 0950   naltrexone  (DEPADE) tablet 50 mg  50 mg Oral Daily Goro Wenrick, MD   50 mg at 12/01/23 0950   nicotine  polacrilex (NICORETTE ) gum 2 mg  2 mg Oral Q2H PRN Christoher Drudge, MD   2 mg at 11/30/23 1910   thiamine  (VITAMIN B1) injection 100 mg  100 mg Intramuscular Once White, Patrice L, NP       thiamine  (VITAMIN B1) tablet 100 mg  100 mg Oral Daily White, Patrice L, NP   100 mg at 12/01/23 0950   traZODone  (DESYREL ) tablet 50 mg  50 mg Oral QHS PRN White, Patrice L, NP   50 mg at 11/30/23 2119   Current Outpatient Medications  Medication Sig Dispense Refill   [START ON 12/02/2023] buPROPion  (WELLBUTRIN  SR) 150 MG 12 hr tablet Take 1 tablet (150 mg total) by mouth daily. 30 tablet 0   [START ON 12/02/2023] Multiple Vitamin (MULTIVITAMIN WITH MINERALS) TABS tablet Take 1 tablet by mouth daily.     [START ON  12/02/2023] naltrexone  (DEPADE) 50 MG tablet Take 1 tablet (50 mg total) by mouth daily. 30 tablet 0   nicotine  polacrilex (NICORETTE ) 2 MG gum Take 1 each (2 mg total) by mouth every 2 (two) hours as needed for smoking cessation. 100 tablet 0    PTA Medications:  Facility Ordered Medications  Medication   acetaminophen  (TYLENOL ) tablet 650 mg   alum & mag hydroxide-simeth (MAALOX/MYLANTA) 200-200-20 MG/5ML suspension 30 mL   magnesium  hydroxide (MILK OF MAGNESIA) suspension 30 mL   haloperidol  (HALDOL ) tablet 5 mg   And   diphenhydrAMINE  (BENADRYL ) capsule 50 mg   haloperidol  lactate (HALDOL ) injection 5 mg   And   diphenhydrAMINE  (BENADRYL ) injection 50 mg   And   LORazepam  (ATIVAN ) injection 2 mg   haloperidol  lactate (HALDOL ) injection 10 mg   And   diphenhydrAMINE  (BENADRYL ) injection 50 mg   And   LORazepam  (ATIVAN ) injection 2 mg   hydrOXYzine  (ATARAX ) tablet 25 mg  traZODone  (DESYREL ) tablet 50 mg   thiamine  (VITAMIN B1) injection 100 mg   thiamine  (VITAMIN B1) tablet 100 mg   multivitamin with minerals tablet 1 tablet   [EXPIRED] LORazepam  (ATIVAN ) tablet 1 mg   [EXPIRED] loperamide  (IMODIUM ) capsule 2-4 mg   [EXPIRED] ondansetron  (ZOFRAN -ODT) disintegrating tablet 4 mg   [COMPLETED] LORazepam  (ATIVAN ) tablet 1 mg   Followed by   [COMPLETED] LORazepam  (ATIVAN ) tablet 1 mg   Followed by   [COMPLETED] LORazepam  (ATIVAN ) tablet 1 mg   Followed by   [COMPLETED] LORazepam  (ATIVAN ) tablet 1 mg   naltrexone  (DEPADE) tablet 50 mg   buPROPion  (WELLBUTRIN  SR) 12 hr tablet 150 mg   [COMPLETED] nicotine  polacrilex (NICORETTE ) gum 2 mg   nicotine  polacrilex (NICORETTE ) gum 2 mg   PTA Medications  Medication Sig   [START ON 12/02/2023] buPROPion  (WELLBUTRIN  SR) 150 MG 12 hr tablet Take 1 tablet (150 mg total) by mouth daily.   nicotine  polacrilex (NICORETTE ) 2 MG gum Take 1 each (2 mg total) by mouth every 2 (two) hours as needed for smoking cessation.   [START ON 12/02/2023]  naltrexone  (DEPADE) 50 MG tablet Take 1 tablet (50 mg total) by mouth daily.   [START ON 12/02/2023] Multiple Vitamin (MULTIVITAMIN WITH MINERALS) TABS tablet Take 1 tablet by mouth daily.       07/21/2022   11:05 AM 04/22/2022   12:48 PM 04/20/2022   11:33 PM  Depression screen PHQ 2/9  Decreased Interest 1 1 0  Down, Depressed, Hopeless 1 1 0  PHQ - 2 Score 2 2 0  Altered sleeping 0 0 0  Tired, decreased energy 1 1 0  Change in appetite 2 3 0  Feeling bad or failure about yourself  3 1 0  Trouble concentrating 0 0 0  Moving slowly or fidgety/restless 0 1 0  Suicidal thoughts 0 0 0  PHQ-9 Score 8 8 0  Difficult doing work/chores  Somewhat difficult     Flowsheet Row ED from 11/27/2023 in [facility] Most recent reading at 11/27/2023  3:32 PM ED from 11/27/2023 in [facility] Most recent reading at 11/27/2023  3:23 PM ED from 04/27/2023 in [facility] at [facility] Most recent reading at 04/27/2023  1:03 PM  C-SSRS RISK CATEGORY No Risk No Risk No Risk    Musculoskeletal  Strength & Muscle Tone: within normal limits Gait & Station: normal Patient leans: N/A  Psychiatric Specialty Exam  Presentation  General Appearance:  Appropriate for Environment  Eye Contact: Fair  Speech: Clear and Coherent  Speech Volume: Normal  Handedness: Right   Mood and Affect  Mood: Euthymic  Affect: Appropriate   Thought Process  Thought Processes: Coherent  Descriptions of Associations:Intact  Orientation:Full (Time, Place and Person)  Thought Content:Logical  Diagnosis of Schizophrenia or Schizoaffective disorder in past: No data recorded   Hallucinations:Hallucinations: None  Ideas of Reference:None  Suicidal Thoughts:Suicidal Thoughts: No  Homicidal Thoughts:Homicidal Thoughts: No   Sensorium  Memory: Immediate Fair  Judgment: Fair  Insight: Fair   Producer, television/film/video: Fair  Attention Span: Fair  Recall: [employer] of Knowledge: Fair  Language: Fair   Psychomotor Activity  Psychomotor Activity: Psychomotor Activity: Normal   Assets  Assets: Communication Skills; Desire for Improvement; Physical Health; Resilience; Social Support   Sleep  Sleep: Sleep: Fair  Estimated Sleeping Duration (Last 24 Hours): 10.25-10.50 hours  No data recorded  Physical Exam  General: Well developed, well nourished, [place] male Pupils: Normal at 3mm Respiratory: Breathing is unlabored.  Cardiovascular: No edema.  Language: No anomia, no aphasia Muscle strength and tone-pt moving all extremities.  Gait not assessed as pt remained in bed.  Neuro: Facial muscles are symmetric. Pt without tremor, no evidence of hyperarousal.   Review of Systems  Constitutional: Negative.   HENT: Negative.    Eyes: Negative.   Respiratory: Negative.    Cardiovascular: Negative.   Gastrointestinal: Negative.   Genitourinary: Negative.   Musculoskeletal: Negative.   Skin: Negative.   Neurological: Negative.   Endo/Heme/Allergies: Negative.   Psychiatric/Behavioral:  Positive for depression and substance abuse. The patient is nervous/anxious.   Blood pressure 108/87, pulse 71, temperature 98.3 F (36.8 C), temperature source Oral, resp. rate 18, SpO2 100%. There is no height or weight on file to calculate BMI.  Demographic Factors:  Male, Low socioeconomic status, Living alone, and Unemployed  Loss Factors: Decrease in vocational status, Legal issues, and Financial problems/change in socioeconomic status  Historical Factors: Impulsivity  Risk Reduction Factors:   Sense of responsibility to family, Living with another person, especially a relative, Positive social support, Positive therapeutic relationship, and Positive coping skills or problem solving skills  Continued Clinical Symptoms:  Alcohol /Substance  Abuse/Dependencies  Cognitive Features That Contribute To Risk:  None    Suicide Risk:  Minimal: No identifiable suicidal ideation.  Patients presenting with no risk factors but with morbid ruminations; may be classified as minimal risk based on the severity of the depressive symptoms  Patient denies SI or HI for >48 hours. Denies wanting to be dead and future oriented. SRA complete and acute risk for suicide is low.   [place] Suicide Risk assessment:  1. Do you wish to be dead? NONE REPORTED 2. Have you wished your dead or wished you could go to sleep and not wake up? NONE REPORTED 3.  Have you actually had thoughts of killing yourself?  NONE REPORTED 4.  Have you been thinking about how you might do this?  NONE REPORTED 5.  Have you had these thoughts and some intention of acting on them? NONE REPORTED 6.  Have you started to work out or worked out the details to kill yourself? NONE REPORTED 7.  Do you intend to carry out this plan? NONE REPORTED 8. On a scale of 1-5 with 1 being the least severe and 5 being the most severe answer the following questions place for intensity of ideation. ZERO 9. How many times have you had these thoughts? NONE REPORTED 10. When you have the thoughts how long to the last?  NONE REPORTED 11. Control ability.  Could you or can you stop thinking about killing herself or wanting to die if you want to?  YES 12. Are there any things anyone or anything family religion pain of death that stop you from wanting to die or acting on thoughts of committing suicide?  FAMILY 13.  What sort of reason to do have to think about wanting to die or killing yourself? NONE REPORTED 14.Was it to end the pain or stop the way you are feeling in other words you could not go on living with his pain or how you are feeling or was not to get attention revenge or reaction from others?  Or both?  NONE REPORTED  [place] Suicide Risk assessment:  1. Do you wish to be dead? NONE  REPORTED 2. Have you wished your dead or wished you could  go to sleep and not wake up? NONE REPORTED 3.  Have you actually had thoughts of killing yourself?  NONE REPORTED 4.  Have you been thinking about how you might do this?  NONE REPORTED 5.  Have you had these thoughts and some intention of acting on them? NONE REPORTED 6.  Have you started to work out or worked out the details to kill yourself? NONE REPORTED 7.  Do you intend to carry out this plan? NONE REPORTED 8. On a scale of 1-5 with 1 being the least severe and 5 being the most severe answer the following questions place for intensity of ideation. ZERO 9. How many times have you had these thoughts? NONE REPORTED 10. When you have the thoughts how long to the last?  NONE REPORTED 11. Control ability.  Could you or can you stop thinking about killing herself or wanting to die if you want to?  YES 12. Are there any things anyone or anything family religion pain of death that stop you from wanting to die or acting on thoughts of committing suicide?  FAMILY 13.  What sort of reason to do have to think about wanting to die or killing yourself? NONE REPORTED 14.Was it to end the pain or stop the way you are feeling in other words you could not go on living with his pain or how you are feeling or was not to get attention revenge or reaction from others?  Or both?  NONE REPORTED   Plan Of Care/Follow-up recommendations:  Patient has appointment at [facility] in 2 weeks, declined to wait for referrals to [facility] and placement to other facilities  Disposition: discharge home  Alizza Sacra, MD 12/01/2023, 3:35 PM

## 2023-12-01 NOTE — ED Provider Notes (Signed)
 Behavioral Health Progress Note  Date and Time: 12/01/2023 12:04 PM Name: Howard Adams MRN:  [id number]  Subjective:   Patient reports feeling depressed but improving mood, energy and resolution of anhedonia. Patient is working with Child psychotherapist to find rehab placement. Tolerating Wellbutrin  and nicotine  gum for depression and nicotine  cessation. Craving to smoke. Declines nicotine  patch. Patient reports fair sleep and appetite. Denies SI or HI. Denies AVH. Patient is tolerating Ativan  taper and naltrexone  well and denies side effects. Denies wanting to be dead.  Diagnosis:  Final diagnoses:  Alcohol  dependence with uncomplicated withdrawal (HCC)  Cannabis dependence (HCC)  Substance induced mood disorder (HCC)    Total Time spent with patient: 20 minutes  Past Psychiatric History:  Psychiatric diagnoses: alcohol  dependence, substance induced mood disorder Suicide attempts :denies Homicide: denies Admissions: three prior admissions to [facility] otherwise denies  Past Medical History: denies Family History: denies and denies history of suicide attempts Social History: kicked out by girlfriend with strained relationship, staying with grandmother  Additional Social History:                         Sleep: Fair  Appetite:  Fair  Current Medications:  Current Facility-Administered Medications  Medication Dose Route Frequency Provider Last Rate Last Admin   acetaminophen  (TYLENOL ) tablet 650 mg  650 mg Oral Q6H PRN White, Patrice L, NP       alum & mag hydroxide-simeth (MAALOX/MYLANTA) 200-200-20 MG/5ML suspension 30 mL  30 mL Oral Q4H PRN White, Patrice L, NP       buPROPion  (WELLBUTRIN  SR) 12 hr tablet 150 mg  150 mg Oral Daily Shonice Wrisley, MD   150 mg at 12/01/23 0951   haloperidol  (HALDOL ) tablet 5 mg  5 mg Oral TID PRN White, Patrice L, NP       And   diphenhydrAMINE  (BENADRYL ) capsule 50 mg  50 mg Oral TID PRN White, Patrice L, NP       haloperidol  lactate (HALDOL )  injection 5 mg  5 mg Intramuscular TID PRN White, Patrice L, NP       And   diphenhydrAMINE  (BENADRYL ) injection 50 mg  50 mg Intramuscular TID PRN White, Patrice L, NP       And   LORazepam  (ATIVAN ) injection 2 mg  2 mg Intramuscular TID PRN White, Patrice L, NP       haloperidol  lactate (HALDOL ) injection 10 mg  10 mg Intramuscular TID PRN White, Patrice L, NP       And   diphenhydrAMINE  (BENADRYL ) injection 50 mg  50 mg Intramuscular TID PRN White, Patrice L, NP       And   LORazepam  (ATIVAN ) injection 2 mg  2 mg Intramuscular TID PRN White, Patrice L, NP       hydrOXYzine  (ATARAX ) tablet 25 mg  25 mg Oral TID PRN White, Patrice L, NP   25 mg at 11/28/23 2126   magnesium  hydroxide (MILK OF MAGNESIA) suspension 30 mL  30 mL Oral Daily PRN White, Patrice L, NP       multivitamin with minerals tablet 1 tablet  1 tablet Oral Daily White, Patrice L, NP   1 tablet at 12/01/23 0950   naltrexone  (DEPADE) tablet 50 mg  50 mg Oral Daily Naomia Lenderman, MD   50 mg at 12/01/23 0950   nicotine  polacrilex (NICORETTE ) gum 2 mg  2 mg Oral Q2H PRN Kohler Pellerito, MD   2 mg at 11/30/23 1910  thiamine  (VITAMIN B1) injection 100 mg  100 mg Intramuscular Once White, Patrice L, NP       thiamine  (VITAMIN B1) tablet 100 mg  100 mg Oral Daily White, Patrice L, NP   100 mg at 12/01/23 0950   traZODone  (DESYREL ) tablet 50 mg  50 mg Oral QHS PRN White, Patrice L, NP   50 mg at 11/30/23 2119   No current outpatient medications on file.    Labs  Lab Results:  Admission on 11/27/2023, Discharged on 11/27/2023  Component Date Value Ref Range Status   WBC 11/27/2023 3.9 (L)  4.0 - 10.5 K/uL Final   RBC 11/27/2023 4.28  4.22 - 5.81 MIL/uL Final   Hemoglobin 11/27/2023 13.8  13.0 - 17.0 g/dL Final   HCT 92/95/7974 39.5  39.0 - 52.0 % Final   MCV 11/27/2023 92.3  80.0 - 100.0 fL Final   MCH 11/27/2023 32.2  26.0 - 34.0 pg Final   MCHC 11/27/2023 34.9  30.0 - 36.0 g/dL Final   RDW 92/95/7974 13.6  11.5 - 15.5 % Final    Platelets 11/27/2023 169  150 - 400 K/uL Final   nRBC 11/27/2023 0.0  0.0 - 0.2 % Final   Neutrophils Relative % 11/27/2023 58  % Final   Neutro Abs 11/27/2023 2.3  1.7 - 7.7 K/uL Final   Lymphocytes Relative 11/27/2023 30  % Final   Lymphs Abs 11/27/2023 1.2  0.7 - 4.0 K/uL Final   Monocytes Relative 11/27/2023 9  % Final   Monocytes Absolute 11/27/2023 0.3  0.1 - 1.0 K/uL Final   Eosinophils Relative 11/27/2023 1  % Final   Eosinophils Absolute 11/27/2023 0.0  0.0 - 0.5 K/uL Final   Basophils Relative 11/27/2023 1  % Final   Basophils Absolute 11/27/2023 0.0  0.0 - 0.1 K/uL Final   Immature Granulocytes 11/27/2023 1  % Final   Abs Immature Granulocytes 11/27/2023 0.02  0.00 - 0.07 K/uL Final   Performed at [facility], [address], [place], [place] [address]   Sodium 11/27/2023 142  135 - 145 mmol/L Final   Potassium 11/27/2023 4.1  3.5 - 5.1 mmol/L Final   Chloride 11/27/2023 109  98 - 111 mmol/L Final   CO2 11/27/2023 21 (L)  22 - 32 mmol/L Final   Glucose, Bld 11/27/2023 79  70 - 99 mg/dL Final   Glucose reference range applies only to samples taken after fasting for at least 8 hours.   BUN 11/27/2023 5 (L)  6 - 20 mg/dL Final   Creatinine, Ser 11/27/2023 0.81  0.61 - 1.24 mg/dL Final   Calcium 92/95/7974 9.2  8.9 - 10.3 mg/dL Final   Total Protein 92/95/7974 7.0  6.5 - 8.1 g/dL Final   Albumin 92/95/7974 3.7  3.5 - 5.0 g/dL Final   AST 92/95/7974 29  15 - 41 U/L Final   ALT 11/27/2023 28  0 - 44 U/L Final   Alkaline Phosphatase 11/27/2023 74  38 - 126 U/L Final   Total Bilirubin 11/27/2023 0.4  0.0 - 1.2 mg/dL Final   GFR, Estimated 11/27/2023 >60  >60 mL/min Final   Comment: (NOTE) Calculated using the CKD-EPI Creatinine Equation (2021)    Anion gap 11/27/2023 12  5 - 15 Final   Performed at [facility], [address] [place], [place] [address]   Hgb A1c MFr Bld 11/27/2023 5.2  4.8 - 5.6 % Final   Comment: (NOTE) Diagnosis of Diabetes The following  HbA1c ranges recommended by the American Diabetes Association (ADA) may be used as an aid in the diagnosis of diabetes mellitus.  Hemoglobin             Suggested A1C NGSP%              Diagnosis  <5.7                   Non Diabetic  5.7-6.4                Pre-Diabetic  >6.4                   Diabetic  <7.0                   Glycemic control for                       adults with diabetes.     Mean Plasma Glucose 11/27/2023 102.54  mg/dL Final   Performed at [facility], [address], [place], [place] [address]   Alcohol , Ethyl (B) 11/27/2023 265 (H)  <15 mg/dL Final   Comment: (NOTE) For medical purposes only. Performed at [facility], [address] [place], [place] [address]    Cholesterol 11/27/2023 160  0 - 200 mg/dL Final   Triglycerides 92/95/7974 99  <150 mg/dL Final   HDL 92/95/7974 73  >40 mg/dL Final   Total CHOL/HDL Ratio 11/27/2023 2.2  RATIO Final   VLDL 11/27/2023 20  0 - 40 mg/dL Final   LDL Cholesterol 11/27/2023 67  0 - 99 mg/dL Final   Comment:        Total Cholesterol/HDL:CHD Risk Coronary Heart Disease Risk Table                     Men   Women  1/2 Average Risk   3.4   3.3  Average Risk       5.0   4.4  2 X Average Risk   9.6   7.1  3 X Average Risk  23.4   11.0        Use the calculated Patient Ratio above and the CHD Risk Table to determine the patient's CHD Risk.        ATP III CLASSIFICATION (LDL):  <100     mg/dL   Optimal  899-870  mg/dL   Near or Above                    Optimal  130-159  mg/dL   Borderline  839-810  mg/dL   High  >809     mg/dL   Very High Performed at [facility], [address], [place], [place] [address]    TSH 11/27/2023 0.482  0.350 - 4.500 uIU/mL Final   Comment: Performed by a 3rd Generation assay with a functional sensitivity of <=0.01 uIU/mL. Performed at [facility], [address], [place], [place] [address]    RPR Ser Ql 11/27/2023 NON REACTIVE  NON REACTIVE Final    Performed at [facility], [address] [place], [place] [address]   Neisseria Gonorrhea 11/27/2023 Negative   Final   Chlamydia 11/27/2023 Negative   Final   Comment 11/27/2023 Normal Reference Ranger Chlamydia - Negative   Final   Comment 11/27/2023 Normal Reference Range Neisseria Gonorrhea - Negative   Final   POC Amphetamine  UR 11/27/2023 None Detected  NONE DETECTED (Cut Off Level 1000 ng/mL) Final   POC Secobarbital (BAR) 11/27/2023 None Detected  NONE DETECTED (Cut Off Level 300 ng/mL) Final   POC Buprenorphine (BUP) 11/27/2023 None Detected  NONE DETECTED (Cut Off Level 10 ng/mL) Final   POC Oxazepam (BZO) 11/27/2023 None Detected  NONE DETECTED (Cut Off Level 300 ng/mL) Final   POC Cocaine UR 11/27/2023 Positive (A)  NONE DETECTED (Cut Off Level 300 ng/mL) Final   POC Methamphetamine UR 11/27/2023 None Detected  NONE DETECTED (Cut Off Level 1000 ng/mL) Final   POC Morphine  11/27/2023 None Detected  NONE DETECTED (Cut Off Level 300 ng/mL) Final   POC Methadone UR 11/27/2023 None Detected  NONE DETECTED (Cut Off Level 300 ng/mL) Final   POC Oxycodone  UR 11/27/2023 None Detected  NONE DETECTED (Cut Off Level 100 ng/mL) Final   POC Marijuana UR 11/27/2023 Positive (A)  NONE DETECTED (Cut Off Level 50 ng/mL) Final   HIV Screen 4th Generation wRfx 11/27/2023 Non Reactive  Non Reactive Final   Performed at [facility], [address], [place], [place] [address]    Blood Alcohol  level:  Lab Results  Component Value Date   ETH 265 (H) 11/27/2023   ETH 332 (HH) 06/15/2022    Metabolic Disorder Labs: Lab Results  Component Value Date   HGBA1C 5.2 11/27/2023   MPG 102.54 11/27/2023   MPG 91 04/20/2022   No results found for: PROLACTIN Lab Results  Component Value Date   CHOL 160 11/27/2023   TRIG 99 11/27/2023   HDL 73 11/27/2023   CHOLHDL 2.2 11/27/2023   VLDL 20 11/27/2023   LDLCALC 67 11/27/2023   LDLCALC 70 04/20/2022    Therapeutic Lab Levels: No  results found for: LITHIUM No results found for: VALPROATE No results found for: CBMZ  Physical Findings   AIMS    Flowsheet Row Ad[facility] (Discharged) [facility]  AIMS Total Score 0   AUDIT    Flowsheet Row ED from 11/27/2023 in [facility] Admission (Discharged) [facility] Ad[facility] (Discharged[facility]  Alcohol  Use Disorder Identification Test Final Score (AUDIT) 34 13 28   PHQ2-9    Flowsheet Row Office Visit from 07/21/2022 in [facility] ED from 04/20/2022 in [facility] ED from 03/19/2022 in [facility]  PHQ-2 Total Score 2 2 0  PHQ-9 Total Score 8 8 0   Flowsheet Row ED from 11/27/2023 in [facility] Most recent reading at 11/27/2023  3:32 PM ED from 11/27/2023 in [facility] Most recent reading at 11/27/2023  3:23 PM ED from 04/27/2023 in [facility] Emergency Department at [facility] Most recent reading at 04/27/2023  1:03 PM  C-SSRS RISK CATEGORY No Risk No Risk No Risk     Musculoskeletal  Strength & Muscle Tone: within normal limits Gait & Station: normal Patient leans: N/A  Psychiatric Specialty Exam  Presentation  General Appearance:  Appropriate for Environment  Eye Contact: Fair  Speech: Clear and Coherent  Speech Volume: Normal  Handedness: Right   Mood and Affect  Mood: Depressed  Affect: Congruent   Thought Process  Thought Processes: Coherent  Descriptions of Associations:Intact  Orientation:Full (Time, Place and Person)  Thought Content:Logical  Diagnosis of Schizophrenia or Schizoaffective disorder in past: No data recorded    Hallucinations: denies AVH Ideas of Reference:None  Suicidal Thoughts:denies Homicidal Thoughts:denies  Sensorium  Memory: Immediate Fair; Recent Fair; Remote Fair  Judgment: Fair  Insight: Fair   Art therapist  Concentration: Fair  Attention Span: Fair  Recall: [employer] of Knowledge: Fair  Language: Fair   Psychomotor Activity  Psychomotor Activity:No data recorded  Assets  Assets: Communication Skills; Desire for Improvement; Physical Health; Leisure Time   Sleep  Sleep:No data recorded Estimated Sleeping Duration (Last 24 Hours): 10.25-10.50 hours  No data recorded  Physical Exam  Physical exam:  General: Well developed, well nourished, [place] male Pupils: Normal at 3mm Respiratory: Breathing is unlabored.  Cardiovascular: No edema.  Language: No anomia, no aphasia Muscle strength and tone-pt moving all extremities.  Gait not assessed as pt remained in bed.  Neuro: Facial muscles are symmetric. Pt without tremor, no evidence of hyperarousal.  Review of Systems  Constitutional: Negative.   HENT: Negative.    Eyes: Negative.   Respiratory: Negative.    Cardiovascular: Negative.   Gastrointestinal: Negative.   Genitourinary: Negative.   Musculoskeletal: Negative.   Skin: Negative.   Neurological: Negative.   Endo/Heme/Allergies: Negative.   Psychiatric/Behavioral:  Positive for depression and substance abuse. The patient is nervous/anxious.      Blood pressure 108/87, pulse 71, temperature 98.3 F (36.8 C), temperature source Oral, resp. rate 18, SpO2 100%. There is no height or weight on file to calculate BMI.  Treatment Plan Summary: 38 y.o. 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    7/5:   He states that in the past when withdrawing from alcohol  he usually experiences shakes and sweats and denies a history of alcohol  withdrawal seizures or delirium tremens. He reports smoking marijuana since he was 38 years old, on average he smokes every other day and states that one blunt will last him for 2 days. He denies using other illicit drugs. However, UDS positive for cocaine and THC. He states that he has received substance abuse treatment here ([facility]) 3 different times. He denies other inpatient psychiatric hospitalizations or residential treatment.  Denies SI or HI. Agreeable to naltrexone  for alcohol  cessation. Presentation most consistent with substance induced mood disorder. Patient will consider antidepressant. Denies prior psychiatric history.  7/6: patient tolerating medications well. Declines antidepressant stating he has only felt depressed the past week in context of substance abuse. Denies SI, HI or AVH. Patient is future oriented on going to inpatient rehab.  7/7: patient reports depressed mood and nicotine  dependence. Would like to try Wellbutrin  after discussing r/b/a. Denies SI or HI. Denies AVH. Patient is future oriented on going to inpatient rehab.  7/8: reporting depressed mood. Denies SI or HI. Tolerating Wellbutrin . Awaiting inpatient rehab placement    #substance induced depression -cont Wellbutrin  SR 150 mg qdaily    #alcohol  use disorder -finished Ativan  taper this morning, CIWA 0 and will discontinue  -cont naltrexone  50 mg qdaily after discussing r/b/a  #nicotine  dependence -declines nicotine  patch -nicotine  gum q2h-prn cont Wellbutrin  SR 150 mg qdaily   #cannabis dependence - [facility] referral    Janal Haak, MD 12/01/2023 12:04 PM

## 2023-12-01 NOTE — ED Notes (Signed)
 Pt sitting in dayroom watching television and interacting with peers. No acute distress noted. No concerns voiced. Informed pt to notify staff with any needs or assistance. Pt verbalized understanding and agreement. Will continue to monitor for safety.

## 2024-06-28 ENCOUNTER — Other Ambulatory Visit: Payer: Self-pay

## 2024-06-28 ENCOUNTER — Encounter (HOSPITAL_BASED_OUTPATIENT_CLINIC_OR_DEPARTMENT_OTHER): Payer: Self-pay

## 2024-06-28 ENCOUNTER — Emergency Department (HOSPITAL_BASED_OUTPATIENT_CLINIC_OR_DEPARTMENT_OTHER)
Admission: EM | Admit: 2024-06-28 | Discharge: 2024-06-28 | Disposition: A | Discharge status: Home / Self Care | Attending: Emergency Medicine | Admitting: Emergency Medicine

## 2024-06-28 DIAGNOSIS — R051 Acute cough: Secondary | ICD-10-CM | POA: Insufficient documentation

## 2024-06-28 DIAGNOSIS — F1090 Alcohol use, unspecified, uncomplicated: Secondary | ICD-10-CM | POA: Insufficient documentation

## 2024-06-28 LAB — RESP PANEL BY RT-PCR (RSV, FLU A&B, COVID)  RVPGX2
Influenza A by PCR: NEGATIVE
Influenza B by PCR: NEGATIVE
Resp Syncytial Virus by PCR: NEGATIVE
SARS Coronavirus 2 by RT PCR: NEGATIVE

## 2024-06-28 MED ORDER — FLUTICASONE PROPIONATE 50 MCG/ACT NA SUSP: 2.0000 | Freq: Every day | NASAL | 0 refills | Status: AC

## 2024-06-28 MED ORDER — CHLORDIAZEPOXIDE HCL 25 MG PO CAPS: ORAL_CAPSULE | ORAL | 0 refills | Status: AC

## 2024-06-28 MED ORDER — IBUPROFEN 600 MG PO TABS: 600.0000 mg | ORAL_TABLET | Freq: Four times a day (QID) | ORAL | 0 refills | Status: AC | PRN

## 2024-06-28 MED ORDER — CETIRIZINE HCL 10 MG PO TABS: 10.0000 mg | ORAL_TABLET | Freq: Every day | ORAL | 0 refills | Status: AC

## 2024-06-28 MED ORDER — IBUPROFEN 400 MG PO TABS
600.0000 mg | ORAL_TABLET | Freq: Once | ORAL | Status: AC
  Administered 2024-06-28: 600 mg via ORAL
  Filled 2024-06-28: qty 1

## 2024-06-28 NOTE — Discharge Instructions (Addendum)
 Viral Illness TREATMENT  Treatment is directed at relieving symptoms. There is no cure. Antibiotics are not effective, because the infection is caused by a virus, not by bacteria. Treatment may include:  Increased fluid intake. Sports drinks offer valuable electrolytes, sugars, and fluids.  Breathing heated mist or steam (vaporizer or shower).  Eating chicken soup or other clear broths, and maintaining good nutrition.  Getting plenty of rest.  Using gargles or lozenges for comfort.  Increasing usage of your inhaler if you have asthma.  Return to work when your temperature has returned to normal.  Gargle warm salt water and spit it out for sore throat. Take benadryl to decrease sinus secretions. Continue to alternate between Tylenol and ibuprofen for pain and fever control.  Follow Up: Follow up with your primary care doctor in 5-7 days for recheck of ongoing symptoms.  Return to emergency department for emergent changing or worsening of symptoms.

## 2024-06-28 NOTE — ED Notes (Signed)
 Discharge instructions reviewed with patient. Patient verbalizes understanding, no further questions at this time. Medications/prescriptions and follow up information provided. No acute distress noted at time of departure.

## 2024-06-29 ENCOUNTER — Telehealth (HOSPITAL_BASED_OUTPATIENT_CLINIC_OR_DEPARTMENT_OTHER): Payer: Self-pay | Admitting: Emergency Medicine

## 2024-06-30 ENCOUNTER — Telehealth: Payer: Self-pay

## 2024-06-30 NOTE — Transitions of Care (Post Inpatient/ED Visit) (Cosign Needed)
" ° °  06/30/2024  Name: Howard Adams MRN: [id number] DOB: 07/31/1985  Today's TOC FU Call Status: Today's TOC FU Call Status:: Unsuccessful Call (1st Attempt) Unsuccessful Call (1st Attempt) Date: 06/30/24  Attempted to reach the patient regarding the most recent Inpatient/ED visit.  Follow Up Plan: No further outreach attempts will be made at this time. Per pt he has new PCP.  Signature Suzen Shove   CMA II  "
